# Patient Record
Sex: Male | Born: 1955 | Race: Black or African American | Hispanic: No | Marital: Single | Smoking: Never smoker
Health system: Southern US, Community
[De-identification: ages and names within clinical notes are randomized; demographics above are authoritative.]

## PROBLEM LIST (undated history)

## (undated) DIAGNOSIS — E119 Type 2 diabetes mellitus without complications: Secondary | ICD-10-CM

## (undated) DIAGNOSIS — I10 Essential (primary) hypertension: Secondary | ICD-10-CM

## (undated) DIAGNOSIS — F1010 Alcohol abuse, uncomplicated: Secondary | ICD-10-CM

---

## 2014-08-21 ENCOUNTER — Emergency Department (INDEPENDENT_AMBULATORY_CARE_PROVIDER_SITE_OTHER)
Admission: EM | Admit: 2014-08-21 | Discharge: 2014-08-21 | Disposition: A | Discharge status: Home / Self Care | Payer: Medicaid Other | Source: Home / Self Care | Attending: Emergency Medicine | Admitting: Emergency Medicine

## 2014-08-21 ENCOUNTER — Encounter (HOSPITAL_COMMUNITY): Payer: Self-pay | Admitting: *Deleted

## 2014-08-21 DIAGNOSIS — M7989 Other specified soft tissue disorders: Secondary | ICD-10-CM | POA: Diagnosis not present

## 2014-08-21 LAB — POCT I-STAT, CHEM 8
BUN: 13 mg/dL (ref 6–20)
CALCIUM ION: 1.19 mmol/L (ref 1.13–1.30)
CHLORIDE: 105 mmol/L (ref 101–111)
Creatinine, Ser: 1.3 mg/dL — ABNORMAL HIGH (ref 0.61–1.24)
GLUCOSE: 104 mg/dL — AB (ref 65–99)
HEMATOCRIT: 45 % (ref 39.0–52.0)
Hemoglobin: 15.3 g/dL (ref 13.0–17.0)
Potassium: 4.3 mmol/L (ref 3.5–5.1)
SODIUM: 140 mmol/L (ref 135–145)
TCO2: 25 mmol/L (ref 0–100)

## 2014-08-21 MED ORDER — PREDNISONE 50 MG PO TABS: ORAL_TABLET | ORAL | Status: DC

## 2014-08-21 MED ORDER — IBUPROFEN 800 MG PO TABS: 800.0000 mg | ORAL_TABLET | Freq: Three times a day (TID) | ORAL | Status: DC

## 2014-08-21 NOTE — Discharge Instructions (Signed)
Take prednisone 1 pill daily for 5 days. Take ibuprofen 3 times a day for the next 5 days, then as needed. Come back if no improvement in 2 weeks.

## 2014-08-21 NOTE — ED Notes (Signed)
Pt  Speaks  No english    He  Has  Been in  [place]   approx  5  Months    He  Takes no meds      He  Has  No chronic  Medical  Conditions     [employer] interpretors  Utilized        he  Has   Swelling  Of the  Left  Foot and  Leg       With  Perhaps  Some  Swelling to r  Foot  As   Well   He  denys  Any  Injury      He  denys  Any  Chest pain or  Any  Shortness  Of  Breath  He  Is   Sitting  Upright  And  Is  Speaking in  Complete  sentances

## 2014-08-21 NOTE — ED Provider Notes (Signed)
CSN: [id number]     Arrival date & time 08/21/14  1721 History   First MD Initiated Contact with Patient 08/21/14 1745     Chief Complaint  Patient presents with  . Foot Swelling   (Consider location/radiation/quality/duration/timing/severity/associated sxs/prior Treatment) HPI  He is a 59 year old man here for evaluation of foot swelling. A phone interpreter was utilized for this encounter. He states he woke up on Wednesday morning and his left foot was swollen. He states the right foot is also a little swollen, but the left is worse. He reports some pain and discomfort, primarily along the lateral leg and dorsal foot. He denies any injury or trauma. He does do a lot of walking, but this has not changed recently. No fevers. No chest pain or shortness of breath. He came to [place] in January of this year. He has from the [place] and came [place] the [place] through [place].  History reviewed. No pertinent past medical history. History reviewed. No pertinent past surgical history. History reviewed. No pertinent family history. History  Substance Use Topics  . Smoking status: Never Smoker   . Smokeless tobacco: Not on file  . Alcohol Use: No    Review of Systems As in history of present illness Allergies  Review of patient's allergies indicates no known allergies.  Home Medications   Prior to Admission medications   Medication Sig Start Date End Date Taking? Authorizing Provider  ibuprofen (ADVIL,MOTRIN) 800 MG tablet Take 1 tablet (800 mg total) by mouth 3 (three) times daily. For 5 days, then as needed 08/21/14   Charm RingsErin J Honig, MD  predniSONE (DELTASONE) 50 MG tablet Take 1 pill daily for 5 days. 08/21/14   Charm RingsErin J Honig, MD   BP 152/93 mmHg  Pulse 72  Resp 14  SpO2 97% Physical Exam  Constitutional: He is oriented to person, place, and time. He appears well-developed and well-nourished.  Neck: Neck supple.  Cardiovascular: Normal rate, regular rhythm and normal heart sounds.   No murmur  heard. Pulmonary/Chest: Effort normal and breath sounds normal. No respiratory distress. He has no wheezes. He has no rales.  Musculoskeletal:  Left foot: He has nonpitting edema of the foot and lower leg. No calf tenderness. He has mild tenderness to palpation along the fibula and across the dorsal foot. He has a 2+ DP pulse. No pain with passive range of motion of the ankle or toes.  Neurological: He is alert and oriented to person, place, and time.    ED Course  Procedures (including critical care time) Labs Review Labs Reviewed  POCT I-STAT, CHEM 8 - Abnormal; Notable for the following:    Creatinine, Ser 1.30 (*)    Glucose, Bld 104 (*)    All other components within normal limits    Imaging Review No results found.   MDM   1. Foot swelling    Unclear etiology. History is not concerning for DVT or heart failure. Differential does include tendinitis and gout. We'll treat with a course of prednisone and ibuprofen. [facility]. He will follow-up with them in 2 weeks or sooner as needed.    Charm RingsErin J Honig, MD 08/21/14 [phone]

## 2014-08-25 ENCOUNTER — Inpatient Hospital Stay (HOSPITAL_COMMUNITY)
Admission: EM | Admit: 2014-08-25 | Discharge: 2014-08-27 | DRG: 642 | Disposition: A | Discharge status: Home Health | Payer: Medicaid Other | Attending: Internal Medicine | Admitting: Internal Medicine

## 2014-08-25 ENCOUNTER — Encounter (HOSPITAL_COMMUNITY): Payer: Self-pay | Admitting: Emergency Medicine

## 2014-08-25 ENCOUNTER — Emergency Department (HOSPITAL_COMMUNITY): Payer: Medicaid Other

## 2014-08-25 ENCOUNTER — Inpatient Hospital Stay (HOSPITAL_COMMUNITY): Payer: Medicaid Other

## 2014-08-25 DIAGNOSIS — I5030 Unspecified diastolic (congestive) heart failure: Secondary | ICD-10-CM | POA: Diagnosis present

## 2014-08-25 DIAGNOSIS — R200 Anesthesia of skin: Secondary | ICD-10-CM | POA: Diagnosis present

## 2014-08-25 DIAGNOSIS — E882 Lipomatosis, not elsewhere classified: Secondary | ICD-10-CM | POA: Diagnosis present

## 2014-08-25 DIAGNOSIS — M545 Low back pain: Secondary | ICD-10-CM | POA: Diagnosis present

## 2014-08-25 DIAGNOSIS — M4856XD Collapsed vertebra, not elsewhere classified, lumbar region, subsequent encounter for fracture with routine healing: Secondary | ICD-10-CM | POA: Diagnosis present

## 2014-08-25 DIAGNOSIS — E1140 Type 2 diabetes mellitus with diabetic neuropathy, unspecified: Secondary | ICD-10-CM | POA: Diagnosis present

## 2014-08-25 DIAGNOSIS — E1165 Type 2 diabetes mellitus with hyperglycemia: Secondary | ICD-10-CM | POA: Diagnosis present

## 2014-08-25 DIAGNOSIS — F1010 Alcohol abuse, uncomplicated: Secondary | ICD-10-CM

## 2014-08-25 DIAGNOSIS — R600 Localized edema: Secondary | ICD-10-CM | POA: Diagnosis not present

## 2014-08-25 DIAGNOSIS — M79606 Pain in leg, unspecified: Secondary | ICD-10-CM | POA: Diagnosis present

## 2014-08-25 DIAGNOSIS — M7989 Other specified soft tissue disorders: Secondary | ICD-10-CM | POA: Diagnosis present

## 2014-08-25 DIAGNOSIS — F1011 Alcohol abuse, in remission: Secondary | ICD-10-CM | POA: Diagnosis present

## 2014-08-25 DIAGNOSIS — I5021 Acute systolic (congestive) heart failure: Secondary | ICD-10-CM | POA: Diagnosis not present

## 2014-08-25 DIAGNOSIS — M25561 Pain in right knee: Secondary | ICD-10-CM | POA: Diagnosis present

## 2014-08-25 DIAGNOSIS — M79604 Pain in right leg: Secondary | ICD-10-CM | POA: Diagnosis not present

## 2014-08-25 DIAGNOSIS — M25562 Pain in left knee: Secondary | ICD-10-CM

## 2014-08-25 DIAGNOSIS — M79605 Pain in left leg: Secondary | ICD-10-CM

## 2014-08-25 DIAGNOSIS — R739 Hyperglycemia, unspecified: Secondary | ICD-10-CM | POA: Diagnosis not present

## 2014-08-25 DIAGNOSIS — E119 Type 2 diabetes mellitus without complications: Secondary | ICD-10-CM | POA: Diagnosis present

## 2014-08-25 DIAGNOSIS — R531 Weakness: Secondary | ICD-10-CM

## 2014-08-25 DIAGNOSIS — M549 Dorsalgia, unspecified: Secondary | ICD-10-CM | POA: Diagnosis present

## 2014-08-25 DIAGNOSIS — M5440 Lumbago with sciatica, unspecified side: Secondary | ICD-10-CM | POA: Diagnosis not present

## 2014-08-25 HISTORY — DX: Alcohol abuse, uncomplicated: F10.10

## 2014-08-25 LAB — CBC WITH DIFFERENTIAL/PLATELET
BASOS ABS: 0 10*3/uL (ref 0.0–0.1)
Basophils Relative: 0 % (ref 0–1)
EOS ABS: 0 10*3/uL (ref 0.0–0.7)
Eosinophils Relative: 0 % (ref 0–5)
HEMATOCRIT: 41.4 % (ref 39.0–52.0)
HEMOGLOBIN: 13.7 g/dL (ref 13.0–17.0)
Lymphocytes Relative: 16 % (ref 12–46)
Lymphs Abs: 1.3 10*3/uL (ref 0.7–4.0)
MCH: 27.5 pg (ref 26.0–34.0)
MCHC: 33.1 g/dL (ref 30.0–36.0)
MCV: 83 fL (ref 78.0–100.0)
Monocytes Absolute: 0.1 10*3/uL (ref 0.1–1.0)
Monocytes Relative: 1 % — ABNORMAL LOW (ref 3–12)
NEUTROS PCT: 83 % — AB (ref 43–77)
Neutro Abs: 6.7 10*3/uL (ref 1.7–7.7)
Platelets: 193 10*3/uL (ref 150–400)
RBC: 4.99 MIL/uL (ref 4.22–5.81)
RDW: 13.8 % (ref 11.5–15.5)
WBC: 8.1 10*3/uL (ref 4.0–10.5)

## 2014-08-25 LAB — URINALYSIS, ROUTINE W REFLEX MICROSCOPIC
Bilirubin Urine: NEGATIVE
GLUCOSE, UA: NEGATIVE mg/dL
HGB URINE DIPSTICK: NEGATIVE
Ketones, ur: NEGATIVE mg/dL
Leukocytes, UA: NEGATIVE
NITRITE: NEGATIVE
PH: 6.5 (ref 5.0–8.0)
Protein, ur: NEGATIVE mg/dL
Specific Gravity, Urine: 1.009 (ref 1.005–1.030)
Urobilinogen, UA: 1 mg/dL (ref 0.0–1.0)

## 2014-08-25 LAB — COMPREHENSIVE METABOLIC PANEL
ALK PHOS: 60 U/L (ref 38–126)
ALT: 33 U/L (ref 17–63)
AST: 19 U/L (ref 15–41)
Albumin: 4 g/dL (ref 3.5–5.0)
Anion gap: 10 (ref 5–15)
BUN: 17 mg/dL (ref 6–20)
CO2: 24 mmol/L (ref 22–32)
CREATININE: 0.92 mg/dL (ref 0.61–1.24)
Calcium: 9 mg/dL (ref 8.9–10.3)
Chloride: 105 mmol/L (ref 101–111)
GFR calc Af Amer: >60 mL/min (ref >60)
GFR calc non Af Amer: >60 mL/min (ref >60)
GLUCOSE: 221 mg/dL — AB (ref 65–99)
Potassium: 4.4 mmol/L (ref 3.5–5.1)
SODIUM: 139 mmol/L (ref 135–145)
Total Bilirubin: 0.4 mg/dL (ref 0.3–1.2)
Total Protein: 7.7 g/dL (ref 6.5–8.1)

## 2014-08-25 LAB — CK: Total CK: 62 U/L (ref 49–397)

## 2014-08-25 LAB — SEDIMENTATION RATE: Sed Rate: 2 mm/hr (ref 0–16)

## 2014-08-25 MED ORDER — MORPHINE SULFATE 4 MG/ML IJ SOLN
4.0000 mg | Freq: Once | INTRAMUSCULAR | Status: AC
  Administered 2014-08-25: 4 mg via INTRAVENOUS
  Filled 2014-08-25: qty 1

## 2014-08-25 MED ORDER — SODIUM CHLORIDE 0.9 % IV SOLN
INTRAVENOUS | Status: DC
  Administered 2014-08-25: 125 mL/h via INTRAVENOUS

## 2014-08-25 NOTE — Progress Notes (Signed)
Patient noted to have [place] [employer] [place] [employer] [place] [facility]

## 2014-08-25 NOTE — ED Notes (Signed)
Per EMS, Pt was seen at urgent care a couple days ago for foot pain and dx with foot swelling. Pt has had extreme pain in both soles of his feet. Pt sts the pain has gotten worse. Pt has been ambulating by crawling on his knees. Pt has only been in the [place].  No obvious deformity noted.

## 2014-08-25 NOTE — ED Notes (Signed)
Pt c/o bilateral lower leg pain and lower back pain that radiates down both legs. Pt denies injury. Pain started last week. Pt has no hx of this pain. Pt denies PMH. Pt has full ROM in legs with pain. A&Ox4. No redness or warmth noted to either calf.

## 2014-08-25 NOTE — ED Notes (Signed)
Pt wants us to call relative, Fiston at [phone] when he is ready to be d/c. Pt's family speaks [place] and [place].

## 2014-08-25 NOTE — ED Provider Notes (Signed)
CSN: [id number]     Arrival date & time 08/25/14  1752 History   First MD Initiated Contact with Patient 08/25/14 1817     Chief Complaint  Patient presents with  . Leg Pain  . Back Pain     (Consider location/radiation/quality/duration/timing/severity/associated sxs/prior Treatment) HPI Comments: Patient here complaining of 10 days of worsening bilateral lower extremity pain. Pain starts in his thoracic lumbar spine and radiates down to his lower extremities. Denies any bowel or bladder dysfunction. No saddle anesthesias. Was seen in the clinic recently and started on antiarrhythmics for his. States that today he was anemic and related due to the severe paresthesias to both of his feet worse on the left side. Denies any trauma. No prior history of same. Denies abdominal pain. No syncope  Patient is a 59 y.o. male presenting with leg pain and back pain. The history is provided by the patient. A language interpreter was used.  Leg Pain Associated symptoms: back pain   Back Pain Associated symptoms: leg pain     History reviewed. No pertinent past medical history. History reviewed. No pertinent past surgical history. No family history on file. History  Substance Use Topics  . Smoking status: Never Smoker   . Smokeless tobacco: Not on file  . Alcohol Use: No    Review of Systems  Musculoskeletal: Positive for back pain.  All other systems reviewed and are negative.     Allergies  Review of patient's allergies indicates not on file.  Home Medications   Prior to Admission medications   Not on File   BP 157/96 mmHg  Pulse 72  Temp(Src) 98.3 F (36.8 C) (Oral)  Resp 18  SpO2 98% Physical Exam  Constitutional: He is oriented to person, place, and time. He appears well-developed and well-nourished.  Non-toxic appearance. No distress.  HENT:  Head: Normocephalic and atraumatic.  Eyes: Conjunctivae, EOM and lids are normal. Pupils are equal, round, and reactive to light.    Neck: Normal range of motion. Neck supple. No tracheal deviation present. No thyroid mass present.  Cardiovascular: Normal rate, regular rhythm and normal heart sounds.  Exam reveals no gallop.   No murmur heard. Pulmonary/Chest: Effort normal and breath sounds normal. No stridor. No respiratory distress. He has no decreased breath sounds. He has no wheezes. He has no rhonchi. He has no rales.  Abdominal: Soft. Normal appearance and bowel sounds are normal. He exhibits no distension. There is no tenderness. There is no rebound and no CVA tenderness.  Musculoskeletal: Normal range of motion. He exhibits no edema or tenderness.       Back:  Neurological: He is alert and oriented to person, place, and time. He has normal strength. He displays no atrophy and no tremor. No cranial nerve deficit or sensory deficit. GCS eye subscore is 4. GCS verbal subscore is 5. GCS motor subscore is 6.  Reflex Scores:      Patellar reflexes are 3+ on the right side and 3+ on the left side. Skin: Skin is warm and dry. No abrasion and no rash noted.  Psychiatric: He has a normal mood and affect. His speech is normal and behavior is normal.  Nursing note and vitals reviewed.   ED Course  Procedures (including critical care time) Labs Review Labs Reviewed  CBC WITH DIFFERENTIAL/PLATELET  COMPREHENSIVE METABOLIC PANEL  SEDIMENTATION RATE  CK    Imaging Review No results found.   EKG Interpretation None      MDM  Final diagnoses:  Weakness  Weakness    Patient MRI of his lumbar and thoracic spine both of which were negative for cord compression. Spoke with neurology on call and recommend patient be admitted and they were round and the patient the morning. They agree with not doing any intracranial or cervical spine imaging at this time.    Lorre Nick, MD 08/25/14 [phone]

## 2014-08-25 NOTE — ED Notes (Signed)
Pt returned from MRI °

## 2014-08-25 NOTE — ED Notes (Signed)
Patient is in MRI.  

## 2014-08-25 NOTE — ED Notes (Signed)
Hospitalist at bedside 

## 2014-08-25 NOTE — H&P (Addendum)
[facility] History and Physical  Randy Fisher SVX:[id number] DOB: 03/19/1956 DOA: 08/25/2014  Referring physician: ED physician PCP: PROVIDER NOT IN SYSTEM  Specialists:   Chief Complaint: Back pain, bilateral leg pain, leg edema, weakness, tingling sensations.  HPI: Randy Fisher is a 59 y.o. male with PMH of alcohol abuse, who presents with back pain, bilateral leg pain, bilateral leg edema, weakness tingling sensations.  Patient is a refugee, came to [place] from [place] on 03/2014. He does not speak [place], history is obtained through Optometrist. Patient reports that in the past 10 days, he has been having lower back pain, which is radiating down to his legs and feet bilaterally. It is associated with leg weakness and tingling sensations. He also has bilateral leg edema, worse on the left side. He does not have fever, chills, chest pain, shortness breath, cough, diarrhea, symptoms of her UTI. No tenderness over the calf area.  He was seen in urgent care 2 days ago and was given prescription of prednisone and ibuprofen. He has been taking those 2 medications, but withoy any improvement. He reports that he could possibly have an insect bite to the left foot at home 2 weeks ago, but not very sure.  In ED, patient was found to have WBC 8.1, temperature normal, no tachycardia, negative urinalysis, CK 62, ESR 2, electrolytes okay. MRI of the thoracic and lumbar spine showed epidural lipomatosis at L4-5 and L5-S1 narrowing the central thecal sac, mild left foraminal narrowing at T1-2, mild left foraminal narrowing at L4-5 secondary to a leftward, remote compression fractures of L5 and T4 and moderate distention of the urinary bladder of uncertain etiology. Patient is admitted to inpatient for further evaluation and treatment. Neurology was consulted by ED.  Where does patient live?   At home Can patient participate in ADLs?  Yes      Review of Systems:   General: no fevers, chills, no  changes in body weight, has fatigue HEENT: no blurry vision, hearing changes or sore throat Pulm: no dyspnea, coughing, wheezing CV: no chest pain, palpitations Abd: no nausea, vomiting, abdominal pain, diarrhea, constipation GU: no dysuria, burning on urination, increased urinary frequency, hematuria  Ext: has leg edema and leg pain Neuro: has leg weakness, numbness, or tingling, no vision change or hearing loss Skin: no rash MSK: No muscle spasm, no deformity, no limitation of range of movement in spin Heme: No easy bruising.  Travel history: No recent long distant travel.  Allergy: Not on File  Past Medical History  Diagnosis Date  . Alcohol abuse     History reviewed. No pertinent past surgical history.  Social History:  reports that he has never smoked. He does not have any smokeless tobacco history on file. He reports that he drinks alcohol. His drug history is not on file.  Family History: Patient reports that all family members are healthy, no specific medical issues.  Prior to Admission medications   Not on File    Physical Exam: Filed Vitals:   08/26/14 0119 08/26/14 0254 08/26/14 0536 08/26/14 0706  BP: 159/84 147/82 125/72   Pulse: 78 81 74   Temp: 97.6 F (36.4 C) 97.8 F (36.6 C) 98.2 F (36.8 C)   TempSrc: Oral Oral Oral   Resp: _0 Height:    _1  (1.753 m)  Weight:   81.2 kg (179 lb 0.2 oz)   SpO2: 100% 99% 100%    General: Not in acute distress HEENT:  Eyes: PERRL, EOMI, no scleral icterus.       ENT: No discharge from the ears and nose, no pharynx injection, no tonsillar enlargement.        Neck: positive JVD upon compression of liver, no bruit, no mass felt. Heme: No neck lymph node enlargement. Cardiac: S1/S2, RRR, No murmurs, No gallops or rubs. Pulm: Good air movement bilaterally. No rales, wheezing, rhonchi or rubs. Abd: Soft, nondistended, nontender, no rebound pain, no organomegaly, BS present. Ext: 2+ pitting leg edema on  the left and 1+ on the right. 2+DP/PT pulse bilaterally. Musculoskeletal: No joint deformities, No joint redness or warmth, no limitation of ROM in spin. Skin: No rashes.  Neuro: Alert, oriented X3, cranial nerves II-XII grossly intact, muscle strength 4/5 in all extremities, sensation to light touch intact. Brachial reflex 2+ bilaterally. Knee reflex 2+ on the right, but absence on the left leg. Negative Babinski's sign. Normal finger to nose test. Psych: Patient is not psychotic, no suicidal or hemocidal ideation.  Labs on Admission:  Basic Metabolic Panel:  Recent Labs Lab 08/25/14 1856 08/26/14 0545  NA 139 138  K 4.4 3.8  CL 105 108  CO2 24 26  GLUCOSE 221* 174*  BUN 17 17  CREATININE 0.92 0.87  CALCIUM 9.0 8.5*   Liver Function Tests:  Recent Labs Lab 08/25/14 1856  AST 19  ALT 33  ALKPHOS 60  BILITOT 0.4  PROT 7.7  ALBUMIN 4.0   No results for input(s): LIPASE, AMYLASE in the last 168 hours. No results for input(s): AMMONIA in the last 168 hours. CBC:  Recent Labs Lab 08/25/14 1856 08/26/14 0545  WBC 8.1 8.8  NEUTROABS 6.7  --   HGB 13.7 12.5*  HCT 41.4 38.1*  MCV 83.0 82.1  PLT 193 194   Cardiac Enzymes:  Recent Labs Lab 08/25/14 1856  CKTOTAL 62    BNP (last 3 results) No results for input(s): BNP in the last 8760 hours.  ProBNP (last 3 results) No results for input(s): PROBNP in the last 8760 hours.  CBG:  Recent Labs Lab 08/26/14 0727  GLUCAP 132*    Radiological Exams on Admission: Dg Chest 2 View  08/26/2014   CLINICAL DATA:  Extremity swelling.  EXAM: CHEST  2 VIEW  COMPARISON:  None.  FINDINGS: Low lung volumes. Cardiomegaly. Mild vascular congestion. No effusion or pneumothorax. Bones unremarkable. Chronic compression deformities as reported previously on MR. Both lungs are clear. The visualized skeletal structures are unremarkable.  IMPRESSION: Cardiomegaly. Mild vascular congestion. Early CHF not excluded. Correlate clinically  as a cause of extremity swelling.   Electronically Signed   By: Rolla Flatten M.D.   On: 08/26/2014 01:26   Mr Thoracic Spine Wo Contrast  08/25/2014   CLINICAL DATA:  Unable to ambulate. Bilateral lower leg pain. Low back pain extending into both lower extremities. The pain began last week.  EXAM: MRI THORACIC AND LUMBAR SPINE WITHOUT CONTRAST  TECHNIQUE: Multiplanar and multiecho pulse sequences of the thoracic and lumbar spine were obtained without intravenous contrast.  COMPARISON:  None available.  FINDINGS: MR THORACIC SPINE FINDINGS  The study is mildly degraded by patient motion. Normal signal is present within the thoracic spinal cord. Marrow signal, marrow signal is normal. A remote superior endplate fracture is present at T4. Vertebral body heights are otherwise normal. Alignment is anatomic. No significant disc protrusion or stenosis is present. Asymmetric left-sided facet hypertrophy narrows the foramen at T1-2. The foramina are otherwise patent bilaterally. The paraspinous  soft tissues are normal.  MR LUMBAR SPINE FINDINGS  The conus medullaris scratch the normal signal is present in the conus medullaris which terminates at L1-2. A remote superior endplate fracture is present at L5. No acute fractures are present. Marrow signal and vertebral body heights are otherwise normal. Limited imaging of the abdomen is unremarkable.  L1-2: Mild facet hypertrophy is present bilaterally without significant stenosis.  L2-3: A leftward disc bulge is present. Mild facet hypertrophy is noted bilaterally. There is no significant stenosis.  L3-4: Mild disc bulging and facet hypertrophy is present without significant stenosis.  L4-5: A left lateral disc protrusion and annular tear is noted. Moderate facet hypertrophy is present bilaterally. This leads to mild left foraminal stenosis. The central canal is patent. Prominent epidural fat is noted.  L5-S1: Mild facet hypertrophy is present. Prominent epidural fat is noted.   The urinary bladder is distended, extending superiorly to the level of L4-5.  IMPRESSION: MR THORACIC SPINE IMPRESSION  1. Mild left foraminal narrowing at T1-2 secondary to facet hypertrophy. 2. No significant disc protrusion or or significant stenosis in the remainder of the thoracic spine. 3. Mild left foraminal narrowing at L4-5 secondary to a leftward disc protrusion and annular tear. 4. Multilevel facet hypertrophy in the lumbar spine. 5. 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 6. 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 7. Moderate distention of the urinary bladder of uncertain etiology. 8. Remote compression fractures of L5 and T4. No acute fractures are present.  MR LUMBAR SPINE IMPRESSION   Electronically Signed   By: San Morelle M.D.   On: 08/25/2014 21:01   Mr Lumbar Spine Wo Contrast  08/25/2014   CLINICAL DATA:  Unable to ambulate. Bilateral lower leg pain. Low back pain extending into both lower extremities. The pain began last week.  EXAM: MRI THORACIC AND LUMBAR SPINE WITHOUT CONTRAST  TECHNIQUE: Multiplanar and multiecho pulse sequences of the thoracic and lumbar spine were obtained without intravenous contrast.  COMPARISON:  None available.  FINDINGS: MR THORACIC SPINE FINDINGS  The study is mildly degraded by patient motion. Normal signal is present within the thoracic spinal cord. Marrow signal, marrow signal is normal. A remote superior endplate fracture is present at T4. Vertebral body heights are otherwise normal. Alignment is anatomic. No significant disc protrusion or stenosis is present. Asymmetric left-sided facet hypertrophy narrows the foramen at T1-2. The foramina are otherwise patent bilaterally. The paraspinous soft tissues are normal.  MR LUMBAR SPINE FINDINGS  The conus medullaris scratch the normal signal is present in the conus medullaris which terminates at  L1-2. A remote superior endplate fracture is present at L5. No acute fractures are present. Marrow signal and vertebral body heights are otherwise normal. Limited imaging of the abdomen is unremarkable.  L1-2: Mild facet hypertrophy is present bilaterally without significant stenosis.  L2-3: A leftward disc bulge is present. Mild facet hypertrophy is noted bilaterally. There is no significant stenosis.  L3-4: Mild disc bulging and facet hypertrophy is present without significant stenosis.  L4-5: A left lateral disc protrusion and annular tear is noted. Moderate facet hypertrophy is present bilaterally. This leads to mild left foraminal stenosis. The central canal is patent. Prominent epidural fat is noted.  L5-S1: Mild facet hypertrophy is present. Prominent epidural fat is noted.  The urinary bladder is distended, extending superiorly to the level of L4-5.  IMPRESSION: MR THORACIC SPINE IMPRESSION  1. Mild left foraminal narrowing at T1-2 secondary to facet hypertrophy. 2. No significant disc protrusion or or significant stenosis in the remainder of the thoracic spine. 3. Mild left foraminal narrowing at L4-5 secondary to a leftward disc protrusion and annular tear. 4. Multilevel facet hypertrophy in the lumbar spine. 5. 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 6. 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 7. Moderate distention of the urinary bladder of uncertain etiology. 8. Remote compression fractures of L5 and T4. No acute fractures are present.  MR LUMBAR SPINE IMPRESSION   Electronically Signed   By: San Morelle M.D.   On: 08/25/2014 21:01    EKG: Not done in ED, will get one.   Assessment/Plan Principal Problem:   Leg pain, bilateral Active Problems:   Weakness   Back pain   Leg swelling   Hyperglycemia   History of alcohol abuse   Leg pain   Leg edema  Back pain, leg pain and  leg weakness: it is likely due to spinal disc disease and epidural lipomatosis as evidenced by MRI of thoracic and lumbar spine. Neurology was consulted by ED. -will admit to tele bed -Pain control: Percocet and morphine when necessary -Follow-up there are this recommendation -UDS and HIV -check CRP  Asymmetric leg edema: Left is worse than the right. Etiology is not clear. Patient has positive JVD and history of alcohol abuse, he may have alcoholic cardiomyopathy or congestive heart failure. Also need to rule out DVT. -Check BNP -2-D echo -EKG -LE doppler -CXR  Hyperglycemia: Blood sugar 221 on BMP on admission. May be due to recent prednisone use or undiagnosed diabetes -check A1c -Sliding-scale insulin  History of alcohol abuse: Patient reports that he was drinking heavily before coming to [place]. He drinks beers almost every day. He has not been drinking since 03/2014. Currently no signs of withdrawal. -observe withdrawal symptoms closely.   DVT ppx: SQ Heparin     Code Status: Full code Family Communication: None at bed side.    Disposition Plan: Admit to inpatient   Date of Service 08/26/2014    Ivor Costa [facility] Pager [phone]  If 7PM-7AM, please contact night-coverage [email or web address] Password [facility] 08/26/2014, 7:44 AM

## 2014-08-26 ENCOUNTER — Inpatient Hospital Stay (HOSPITAL_COMMUNITY): Payer: Medicaid Other

## 2014-08-26 ENCOUNTER — Encounter (HOSPITAL_COMMUNITY): Payer: Self-pay | Admitting: Internal Medicine

## 2014-08-26 DIAGNOSIS — R600 Localized edema: Secondary | ICD-10-CM | POA: Insufficient documentation

## 2014-08-26 DIAGNOSIS — M25561 Pain in right knee: Secondary | ICD-10-CM | POA: Diagnosis present

## 2014-08-26 DIAGNOSIS — M7989 Other specified soft tissue disorders: Secondary | ICD-10-CM | POA: Diagnosis present

## 2014-08-26 DIAGNOSIS — I5021 Acute systolic (congestive) heart failure: Secondary | ICD-10-CM

## 2014-08-26 DIAGNOSIS — M549 Dorsalgia, unspecified: Secondary | ICD-10-CM | POA: Diagnosis present

## 2014-08-26 DIAGNOSIS — M25562 Pain in left knee: Secondary | ICD-10-CM

## 2014-08-26 DIAGNOSIS — M79605 Pain in left leg: Secondary | ICD-10-CM

## 2014-08-26 DIAGNOSIS — M5440 Lumbago with sciatica, unspecified side: Secondary | ICD-10-CM

## 2014-08-26 DIAGNOSIS — E119 Type 2 diabetes mellitus without complications: Secondary | ICD-10-CM | POA: Diagnosis present

## 2014-08-26 DIAGNOSIS — F1011 Alcohol abuse, in remission: Secondary | ICD-10-CM | POA: Diagnosis present

## 2014-08-26 DIAGNOSIS — M79604 Pain in right leg: Secondary | ICD-10-CM

## 2014-08-26 LAB — CBC
HEMATOCRIT: 38.1 % — AB (ref 39.0–52.0)
Hemoglobin: 12.5 g/dL — ABNORMAL LOW (ref 13.0–17.0)
MCH: 26.9 pg (ref 26.0–34.0)
MCHC: 32.8 g/dL (ref 30.0–36.0)
MCV: 82.1 fL (ref 78.0–100.0)
Platelets: 194 10*3/uL (ref 150–400)
RBC: 4.64 MIL/uL (ref 4.22–5.81)
RDW: 14 % (ref 11.5–15.5)
WBC: 8.8 10*3/uL (ref 4.0–10.5)

## 2014-08-26 LAB — PROTIME-INR
INR: 1.13 (ref 0.00–1.49)
PROTHROMBIN TIME: 14.7 s (ref 11.6–15.2)

## 2014-08-26 LAB — GLUCOSE, CAPILLARY
GLUCOSE-CAPILLARY: 146 mg/dL — AB (ref 65–99)
GLUCOSE-CAPILLARY: 134 mg/dL — AB (ref 65–99)
Glucose-Capillary: 132 mg/dL — ABNORMAL HIGH (ref 65–99)
Glucose-Capillary: 248 mg/dL — ABNORMAL HIGH (ref 65–99)

## 2014-08-26 LAB — BASIC METABOLIC PANEL
Anion gap: 4 — ABNORMAL LOW (ref 5–15)
BUN: 17 mg/dL (ref 6–20)
CHLORIDE: 108 mmol/L (ref 101–111)
CO2: 26 mmol/L (ref 22–32)
Calcium: 8.5 mg/dL — ABNORMAL LOW (ref 8.9–10.3)
Creatinine, Ser: 0.87 mg/dL (ref 0.61–1.24)
GFR calc Af Amer: >60 mL/min (ref >60)
GFR calc non Af Amer: >60 mL/min (ref >60)
Glucose, Bld: 174 mg/dL — ABNORMAL HIGH (ref 65–99)
POTASSIUM: 3.8 mmol/L (ref 3.5–5.1)
Sodium: 138 mmol/L (ref 135–145)

## 2014-08-26 LAB — C-REACTIVE PROTEIN: CRP: 0.7 mg/dL (ref <1.0)

## 2014-08-26 LAB — APTT: aPTT: 28 seconds (ref 24–37)

## 2014-08-26 LAB — RAPID URINE DRUG SCREEN, HOSP PERFORMED
Amphetamines: NOT DETECTED
BENZODIAZEPINES: NOT DETECTED
Barbiturates: NOT DETECTED
COCAINE: NOT DETECTED
Opiates: POSITIVE — AB
Tetrahydrocannabinol: NOT DETECTED

## 2014-08-26 LAB — URINE CULTURE: Colony Count: 3000

## 2014-08-26 LAB — BRAIN NATRIURETIC PEPTIDE: B Natriuretic Peptide: 100.5 pg/mL — ABNORMAL HIGH (ref 0.0–100.0)

## 2014-08-26 LAB — TSH: TSH: 1.6 u[IU]/mL (ref 0.350–4.500)

## 2014-08-26 LAB — VITAMIN B12: VITAMIN B 12: 392 pg/mL (ref 180–914)

## 2014-08-26 MED ORDER — HEPARIN SODIUM (PORCINE) 5000 UNIT/ML IJ SOLN
5000.0000 [IU] | Freq: Three times a day (TID) | INTRAMUSCULAR | Status: DC
  Administered 2014-08-26 (×3): 5000 [IU] via SUBCUTANEOUS
  Filled 2014-08-26 (×7): qty 1

## 2014-08-26 MED ORDER — ACETAMINOPHEN 650 MG RE SUPP: 650.0000 mg | Freq: Four times a day (QID) | RECTAL | Status: DC | PRN

## 2014-08-26 MED ORDER — OXYCODONE-ACETAMINOPHEN 5-325 MG PO TABS
1.0000 | ORAL_TABLET | ORAL | Status: DC | PRN
  Administered 2014-08-26 – 2014-08-27 (×3): 1 via ORAL
  Filled 2014-08-26 (×3): qty 1

## 2014-08-26 MED ORDER — MORPHINE SULFATE 2 MG/ML IJ SOLN: 2.0000 mg | INTRAMUSCULAR | Status: DC | PRN

## 2014-08-26 MED ORDER — ONDANSETRON HCL 4 MG PO TABS: 4.0000 mg | ORAL_TABLET | Freq: Four times a day (QID) | ORAL | Status: DC | PRN

## 2014-08-26 MED ORDER — POLYETHYLENE GLYCOL 3350 17 G PO PACK
17.0000 g | PACK | Freq: Two times a day (BID) | ORAL | Status: DC
  Administered 2014-08-26 – 2014-08-27 (×3): 17 g via ORAL
  Filled 2014-08-26 (×4): qty 1

## 2014-08-26 MED ORDER — ALUM & MAG HYDROXIDE-SIMETH 200-200-20 MG/5ML PO SUSP: 30.0000 mL | Freq: Four times a day (QID) | ORAL | Status: DC | PRN

## 2014-08-26 MED ORDER — GABAPENTIN 100 MG PO CAPS
100.0000 mg | ORAL_CAPSULE | Freq: Three times a day (TID) | ORAL | Status: DC
  Administered 2014-08-26 – 2014-08-27 (×3): 100 mg via ORAL
  Filled 2014-08-26 (×5): qty 1

## 2014-08-26 MED ORDER — SODIUM CHLORIDE 0.9 % IJ SOLN
3.0000 mL | Freq: Two times a day (BID) | INTRAMUSCULAR | Status: DC
  Administered 2014-08-26 – 2014-08-27 (×3): 3 mL via INTRAVENOUS

## 2014-08-26 MED ORDER — ACETAMINOPHEN 325 MG PO TABS: 650.0000 mg | ORAL_TABLET | Freq: Four times a day (QID) | ORAL | Status: DC | PRN

## 2014-08-26 MED ORDER — INSULIN ASPART 100 UNIT/ML ~~LOC~~ SOLN
0.0000 [IU] | Freq: Three times a day (TID) | SUBCUTANEOUS | Status: DC
  Administered 2014-08-26 (×3): 1 [IU] via SUBCUTANEOUS

## 2014-08-26 MED ORDER — FUROSEMIDE 10 MG/ML IJ SOLN
20.0000 mg | Freq: Every day | INTRAMUSCULAR | Status: DC
  Administered 2014-08-26 – 2014-08-27 (×2): 20 mg via INTRAVENOUS
  Filled 2014-08-26 (×2): qty 2

## 2014-08-26 MED ORDER — ONDANSETRON HCL 4 MG/2ML IJ SOLN: 4.0000 mg | Freq: Four times a day (QID) | INTRAMUSCULAR | Status: DC | PRN

## 2014-08-26 NOTE — Progress Notes (Signed)
OT Cancellation Note  Patient Details Name: Randy Fisher MRN: [id number] DOB: 03/19/1956   Cancelled Treatment:    Reason Eval/Treat Not Completed: Other (comment) Pt with pending doppler results to r/o DVT. Will await results prior to proceeding with OT eval.   Lennox Laity  [phone] 08/26/2014, 12:52 PM

## 2014-08-26 NOTE — Progress Notes (Signed)
Inpatient Diabetes Program Recommendations  AACE/ADA: New Consensus Statement on Inpatient Glycemic Control (2013)  Target Ranges:  Prepandial:   less than 140 mg/dL      Peak postprandial:   less than 180 mg/dL (1-2 hours)      Critically ill patients:  140 - 180 mg/dL   Reason for Assessment: Hyperglycemia (RN requested consult)  Diabetes history: None Outpatient Diabetes medications: None Current orders for Inpatient glycemic control: Novolog sensitive tidwc  59 y.o. male with PMH of alcohol abuse, who presents with back pain, bilateral leg pain, bilateral leg edema, weakness tingling sensations. Pt was seen in urgent care 2 days ago and was given prescription of prednisone and ibuprofen. Blood sugar 221 on BMP on admission. May be due to recent prednisone use or undiagnosed diabetes. HgbA1C pending.  Agree with Novolog sensitive correction tidwc. May need metformin if blood sugars continue > 180 mg/dL. Will f/u with HgbA1C results.  Thank you. Ailene Ards, RD, LDN, CDE Inpatient Diabetes Coordinator [phone]

## 2014-08-26 NOTE — Progress Notes (Signed)
Patient has a Refugee Case Specialist, Silverio Decamp, with [employer] who would like to be called regarding d/c instructions, plans, & medications. Her Number is [phone].

## 2014-08-26 NOTE — Progress Notes (Signed)
*  PRELIMINARY RESULTS* Vascular Ultrasound Lower extremity venous duplex has been completed.  Preliminary findings: negative for DVT  Farrel Demark, RDMS, RVT  08/26/2014, 9:47 AM

## 2014-08-26 NOTE — Progress Notes (Signed)
Patient ID: Randy Fisher, male   DOB: 03/19/1956, 59 y.o.   MRN: [id number]  [facility] PROGRESS NOTE  Randy Fisher [id number] DOB: 03/19/1956 DOA: 08/25/2014 PCP: PROVIDER NOT IN SYSTEM   Brief narrative:    Patient is 59 year old male with known history of alcohol abuse, came to [place] from [place] in January 2016 as a refugee, does not speak English, presented to [facility] emergency department with main concern of 10 days duration of progressively worsening low back pain, constant and sharp, 10/10 in severity, radiating to bilateral lower extremities, worse with ambulation, no specific alleviating factors. In addition patient noted progressively worsening lower extremity swelling, some redness in both ankle areas. This has been associated with numbness and tingling sensation in both lower extremities but worse on the left side. Patient denies fevers and chills, no chest pain or shortness of breath, no cough, no specific abdominal or urinary concerns.  Of note, patient has been seen in urgent care 2 days prior to this admission and was given prescription for prednisone and ibuprofen and reports no improvement in symptoms.  In emergency department, patient noted to be hemodynamically stable, vital signs stable, blood work unremarkable except CBGs in the 200s. Imaging studies included MRI of thoracic and lumbar spine and notable for epidural lipomatosis at L4-5 and L5-S1 narrowing at the central thecal sac, remote compression fractures at L5 and T4, moderate distention of urinary bladder of unclear etiology. Neurologist was consulted by emergency room physician. [facility] asked to admit for further evaluation.  Assessment/Plan:    Principal Problem:   Acute low back pain - Findings of MRI noted below - Provide analgesia as needed, PT evaluation requested - Follow-up on neurology's recommendation  Active Problems:   Bilateral lower extremity edema - This is possibly related to  congestive heart failure as chest x-ray notable for vascular congestion and early CHF - Proceed with echocardiogram for further evaluation - Placed on Lasix 20 mg IV daily for now and monitor clinical response - Weight today is 179 lbs, monitor daily weights, strict I/O    Bilateral lower extremity pain with numbness and tingling - Workup for diabetes in progress as this could be related to neuropathy secondary to diabetes - Placed on Neurontin for now - CBG control with sliding scale insulin for now    Hyperglycemia - Likely diabetic - A1c pending - Continue sliding scale insulin for now and further medical therapy to be determined once A1c is back     History of alcohol abuse - Unclear if patient still actively drinking - Placed on CIWA protocol  DVT prophylaxis -  Heparin SQ   Code Status: Full.  Family Communication:  plan of care discussed with the patient Disposition Plan: Home when stable.   IV access:  Peripheral IV  Procedures and diagnostic studies:     Dg Chest 2 View 08/26/2014 Cardiomegaly. Mild vascular congestion. Early CHF not excluded. Correlate clinically as a cause of extremity swelling.    Mr Lumbar Spine Wo Contrast 08/25/2014    Mild left foraminal narrowing at T1-2 secondary to facet hypertrophy. 2. No significant disc protrusion or or significant stenosis in the remainder of the thoracic spine. 3. Mild left foraminal narrowing at L4-5 secondary to a leftward disc protrusion and annular tear. 4. Multilevel facet hypertrophy in the lumbar spine. 5. 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 6. Epidural lipomatosis is evident at L4-5 and L5-S1 narrowing the central  thecal sac. 7. Moderate distention of the urinary bladder of uncertain etiology. 8. Remote compression fractures of L5 and T4. No acute fractures are present.   Medical Consultants:  Neurology   Other Consultants:  Diabetic educator    IAnti-Infectives:   None   Debbora Presto, MD  [facility] Pager [phone]  If 7PM-7AM, please contact night-coverage [email or web address] Password [email or web address] 08/26/2014, 10:27 AM   LOS: 1 day   HPI/Subjective: No events overnight.   Objective: Filed Vitals:   08/26/14 0119 08/26/14 0254 08/26/14 0536 08/26/14 0706  BP: 159/84 147/82 125/72   Pulse: 78 81 74   Temp: 97.6 F (36.4 C) 97.8 F (36.6 C) 98.2 F (36.8 C)   TempSrc: Oral Oral Oral   Resp: 18 18 18    Height:    5\' 9"  (1.753 m)  Weight:   81.2 kg (179 lb 0.2 oz)   SpO2: 100% 99% 100%     Intake/Output Summary (Last 24 hours) at 08/26/14 1027 Last data filed at 08/26/14 0932  Gross per 24 hour  Intake    300 ml  Output      0 ml  Net    300 ml    Exam:   General:  Pt is alert, follows commands appropriately, not in acute distress  Cardiovascular: Regular rate and rhythm, S1/S2, no murmurs, no rubs, no gallops  Respiratory: Clear to auscultation bilaterally, no wheezing, bibasilar crackles   Abdomen: Soft, non tender, non distended, bowel sounds present, no guarding  Extremities: bilateral lower extremity edema, +2 pitting, no tenderness to palpation   Neuro: Grossly nonfocal  Data Reviewed: Basic Metabolic Panel:  Recent Labs Lab 08/25/14 1856 08/26/14 0545  NA 139 138  K 4.4 3.8  CL 105 108  CO2 24 26  GLUCOSE 221* 174*  BUN 17 17  CREATININE 0.92 0.87  CALCIUM 9.0 8.5*   Liver Function Tests:  Recent Labs Lab 08/25/14 1856  AST 19  ALT 33  ALKPHOS 60  BILITOT 0.4  PROT 7.7  ALBUMIN 4.0   CBC:  Recent Labs Lab 08/25/14 1856 08/26/14 0545  WBC 8.1 8.8  NEUTROABS 6.7  --   HGB 13.7 12.5*  HCT 41.4 38.1*  MCV 83.0 82.1  PLT 193 194   Cardiac Enzymes:  Recent Labs Lab 08/25/14 1856  CKTOTAL 62   CBG:  Recent Labs Lab 08/26/14 0727  GLUCAP 132*   Scheduled Meds: . heparin  5,000 Units Subcutaneous 3 times per day  . insulin aspart  0-9 Units Subcutaneous TID WC  .  sodium chloride  3 mL Intravenous Q12H   Continuous Infusions:

## 2014-08-26 NOTE — ED Notes (Signed)
Pt given a turkey sandwich and apple juice 

## 2014-08-26 NOTE — Evaluation (Signed)
Physical Therapy Evaluation Patient Details Name: Josie Mesa MRN: [id number] DOB: 03/19/1956 IntereToday's Date: 08/26/2014   History of Present Illness  Knox Cervi is an 59 y.o. male with known history of alcohol abuse who recently came to the [place] from the [place] in January 2016. He does not speak [place]. Presents to the ED 08/25/14 with 10 day history of progressively worsening lower extremity pain. Notes pain is predominantly in his feet but also at times will start in lumbar region and radiate distally. Distal pain is a burning paresthesia that is worse at night, causing severe pain when he attempts to walk. MRI demonstrates lipomatosis L45, L5S1  Clinical Impression  Interpreter service utilized.  Patient ambulated  X 20', very slowly. Patient indicated that he could walk better with shoes. No shoes available. Patient  Ambulated and stood  With R foot supinated, unable to get info about pain in feet specifically. Patient has a flight of steps at home. Patient will benefit from PT to address problems listed in note below.(PT Problem List)    Follow Up Recommendations Home health PT;Supervision/Assistance - 24 hour    Equipment Recommendations  Rolling walker with 5" wheels    Recommendations for Other Services       Precautions / Restrictions Precautions Precautions: Fall      Mobility  Bed Mobility Overal bed mobility: Needs Assistance Bed Mobility: Supine to Sit;Sit to Supine     Supine to sit: Supervision Sit to supine: Supervision   General bed mobility comments: moves slowly, extra effort to get legs onto bed.  Transfers Overall transfer level: Needs assistance Equipment used: Rolling walker (2 wheeled) Transfers: Sit to/from Stand Sit to Stand: Mod assist;From elevated surface;+2 safety/equipment         General transfer comment: cues for hand placement, assist to powerup.,  Ambulation/Gait Ambulation/Gait assistance: +2 safety/equipment;Mod  assist Ambulation Distance (Feet): 20 Feet Assistive device: Rolling walker (2 wheeled) Gait Pattern/deviations: Step-to pattern;Decreased stride length;Shuffle Gait velocity: slow   General Gait Details: initially appeared that R knee was hyperextending, patient  supinates R foot and weight bears on the side.  Stairs            Wheelchair Mobility    Modified Rankin (Stroke Patients Only)       Balance Overall balance assessment: History of Falls;Needs assistance Sitting-balance support: Feet supported;No upper extremity supported Sitting balance-Leahy Scale: Fair Sitting balance - Comments: able to don pants while sitting on the edge, Postural control: Posterior lean Standing balance support: During functional activity;Bilateral upper extremity supported Standing balance-Leahy Scale: Poor                               Pertinent Vitals/Pain Pain Assessment: Faces Faces Pain Scale: Hurts even more Pain Location: both feet, difficult to get info via interpreter, patient states that if he had his shoes he could walk better. Pain Descriptors / Indicators: Discomfort;Grimacing;Guarding Pain Intervention(s): Limited activity within patient's tolerance;Monitored during session    Home Living Family/patient expects to be discharged to:: Private residence Living Arrangements: Children Available Help at Discharge: Family Type of Home: Apartment Home Access: Stairs to enter   Secretary/administrator of Steps: 12 with a rail Home Layout: One level Home Equipment: None      Prior Function           Comments: through interpreter present, pt has been crawling around his home for the past day as he cant tolerate  walking/weightbearing. Pt has been having difficulty mobilizing prior to this also but worse in the last day     Hand Dominance        Extremity/Trunk Assessment               Lower Extremity Assessment: RLE deficits/detail;LLE  deficits/detail RLE Deficits / Details: moves  slowly, stands on outside of foot,  LLE Deficits / Details: stands on flat foot,  Movements are slow, appears WFL strength  Cervical / Trunk Assessment: Other exceptions  Communication   Communication: Prefers language other than English ([place]ili)  Cognition Arousal/Alertness: Awake/alert Behavior During Therapy: WFL for tasks assessed/performed Overall Cognitive Status: Difficult to assess                      General Comments      Exercises        Assessment/Plan    PT Assessment Patient needs continued PT services  PT Diagnosis Difficulty walking;Abnormality of gait;Generalized weakness;Acute pain   PT Problem List Decreased strength;Decreased range of motion;Decreased activity tolerance;Decreased balance;Decreased mobility;Decreased knowledge of precautions;Decreased safety awareness;Decreased knowledge of use of DME  PT Treatment Interventions DME instruction;Gait training;Stair training;Functional mobility training;Therapeutic activities;Therapeutic exercise;Patient/family education   PT Goals (Current goals can be found in the Care Plan section) Acute Rehab PT Goals Patient Stated Goal: did not state, via interpreter, agreeable to walk PT Goal Formulation: With patient Time For Goal Achievement: 09/09/14 Potential to Achieve Goals: Good    Frequency Min 3X/week   Barriers to discharge        Co-evaluation               End of Session Equipment Utilized During Treatment: Gait belt Activity Tolerance: Patient tolerated treatment well Patient left: in bed;with call bell/phone within reach;with bed alarm set Nurse Communication: Mobility status         Time: 1420-1450 PT Time Calculation (min) (ACUTE ONLY): 30 min   Charges:   PT Evaluation $Initial PT Evaluation Tier I: 1 Procedure PT Treatments $Gait Training: 8-22 mins   PT G Codes:        Rada Hay 08/26/2014, 3:48 PM Blanchard Kelch PT [phone]

## 2014-08-26 NOTE — Progress Notes (Signed)
Echocardiogram 2D Echocardiogram has been performed.  Randy Fisher 08/26/2014, 12:21 PM

## 2014-08-26 NOTE — Consult Note (Signed)
Consult Reason for Consult:lower back pain Referring Physician:   CC: lower back pain  HPI: Randy Fisher is an 59 y.o. male with known history of alcohol abuse who recently came to the [place] from the [place] in January 2016. History obtained via translator as he does not speak [place]. Presents to the ED with 10 day history of progressively worsening lower extremity pain. Notes pain is predominantly in his feet but also at times will start in lumbar region and radiate distally. Distal pain is a burning paresthesia that is worse at night, causing severe pain when he attempts to walk. Denies any saddle anesthesia or bowel/bladder dysfunction. Reports remote history of trauma to his back when he was 13.   Recently evaluated at urgent care, given prednisone taper with no improvement. MRI T and L spine imaging reviewed, shows epidural lipomatosis at L4-5 and L5-S1 with narrowing of the central thecal sac along with multilevel degenerative changes but no frank cord or nerve root impingement noted.   Past Medical History  Diagnosis Date  . Alcohol abuse     History reviewed. No pertinent past surgical history.  No family history on file.  Social History:  reports that he has never smoked. He does not have any smokeless tobacco history on file. He reports that he drinks alcohol. His drug history is not on file.  Not on File  Medications:  Scheduled: . furosemide  20 mg Intravenous Daily  . heparin  5,000 Units Subcutaneous 3 times per day  . insulin aspart  0-9 Units Subcutaneous TID WC  . sodium chloride  3 mL Intravenous Q12H    ROS: Out of a complete 14 system review, the patient complains of only the following symptoms, and all other reviewed systems are negative. +pain  Physical Examination: Filed Vitals:   08/26/14 0536  BP: 125/72  Pulse: 74  Temp: 98.2 F (36.8 C)  Resp: 18   Physical Exam  Constitutional: He appears well-developed and well-nourished.  Psych: Affect  appropriate to situation Eyes: No scleral injection HENT: No OP obstrucion Head: Normocephalic.  Cardiovascular: Normal rate and regular rhythm.  Respiratory: Effort normal and breath sounds normal.  GI: Soft. Bowel sounds are normal. No distension. There is no tenderness.  Skin: WDI  Neurologic Examination Mental Status: (obtained via translator) Alert, oriented, thought content appropriate.  Speech fluent without evidence of aphasia.  Able to follow 3 step commands without difficulty. Cranial Nerves: II: optic discs not visualized, visual fields grossly normal, pupils equal, round, reactive to light III,IV, VI: ptosis not present, extra-ocular motions intact bilaterally V,VII: smile symmetric, facial light touch sensation normal bilaterally VIII: hearing normal bilaterally IX,X: gag reflex present XI: trapezius strength/neck flexion strength normal bilaterally XII: tongue strength normal  Motor: Right : Upper extremity    Left:     Upper extremity 5/5 deltoid       5/5 deltoid 5/5 biceps      5/5 biceps  5/5 triceps      5/5 triceps 5/5 hand grip      5/5 hand grip  Lower extremity     Lower extremity 5/5 hip flexor      5/5 hip flexor 5/5 quadricep      5/5 quadriceps  5/5 hamstrings     5/5 hamstrings 5/5 plantar flexion       5/5 plantar flexion 5/5 plantar extension     5/5 plantar extension Tone and bulk:normal tone throughout; no atrophy noted Sensory: Pinprick and light touch intact  throughout, bilaterally Deep Tendon Reflexes: 2+ and symmetric throughout Plantars: Right: downgoing   Left: downgoing Cerebellar: normal finger-to-nose, and normal heel-to-shin test Gait: deferred  Laboratory Studies:   Basic Metabolic Panel:  Recent Labs Lab 08/25/14 1856 08/26/14 0545  NA 139 138  K 4.4 3.8  CL 105 108  CO2 24 26  GLUCOSE 221* 174*  BUN 17 17  CREATININE 0.92 0.87  CALCIUM 9.0 8.5*    Liver Function Tests:  Recent Labs Lab 08/25/14 1856  AST  19  ALT 33  ALKPHOS 60  BILITOT 0.4  PROT 7.7  ALBUMIN 4.0   No results for input(s): LIPASE, AMYLASE in the last 168 hours. No results for input(s): AMMONIA in the last 168 hours.  CBC:  Recent Labs Lab 08/25/14 1856 08/26/14 0545  WBC 8.1 8.8  NEUTROABS 6.7  --   HGB 13.7 12.5*  HCT 41.4 38.1*  MCV 83.0 82.1  PLT 193 194    Cardiac Enzymes:  Recent Labs Lab 08/25/14 1856  CKTOTAL 62    BNP: Invalid input(s): POCBNP  CBG:  Recent Labs Lab 08/26/14 0727  GLUCAP 132*    Microbiology: No results found for this or any previous visit.  Coagulation Studies:  Recent Labs  08/26/14 0150  LABPROT 14.7  INR 1.13    Urinalysis:  Recent Labs Lab 08/25/14 2150  COLORURINE YELLOW  LABSPEC 1.009  PHURINE 6.5  GLUCOSEU NEGATIVE  HGBUR NEGATIVE  BILIRUBINUR NEGATIVE  KETONESUR NEGATIVE  PROTEINUR NEGATIVE  UROBILINOGEN 1.0  NITRITE NEGATIVE  LEUKOCYTESUR NEGATIVE    Lipid Panel:  No results found for: CHOL, TRIG, HDL, CHOLHDL, VLDL, LDLCALC  HgbA1C: No results found for: HGBA1C  Urine Drug Screen:     Component Value Date/Time   LABOPIA POSITIVE* 08/26/2014 0720   COCAINSCRNUR NONE DETECTED 08/26/2014 0720   LABBENZ NONE DETECTED 08/26/2014 0720   AMPHETMU NONE DETECTED 08/26/2014 0720   THCU NONE DETECTED 08/26/2014 0720   LABBARB NONE DETECTED 08/26/2014 0720    Alcohol Level: No results for input(s): ETH in the last 168 hours.   Imaging: Dg Chest 2 View  08/26/2014   CLINICAL DATA:  Extremity swelling.  EXAM: CHEST  2 VIEW  COMPARISON:  None.  FINDINGS: Low lung volumes. Cardiomegaly. Mild vascular congestion. No effusion or pneumothorax. Bones unremarkable. Chronic compression deformities as reported previously on MR. Both lungs are clear. The visualized skeletal structures are unremarkable.  IMPRESSION: Cardiomegaly. Mild vascular congestion. Early CHF not excluded. Correlate clinically as a cause of extremity swelling.   Electronically  Signed   By: Davonna Belling M.D.   On: 08/26/2014 01:26   Mr Thoracic Spine Wo Contrast  08/25/2014   CLINICAL DATA:  Unable to ambulate. Bilateral lower leg pain. Low back pain extending into both lower extremities. The pain began last week.  EXAM: MRI THORACIC AND LUMBAR SPINE WITHOUT CONTRAST  TECHNIQUE: Multiplanar and multiecho pulse sequences of the thoracic and lumbar spine were obtained without intravenous contrast.  COMPARISON:  None available.  FINDINGS: MR THORACIC SPINE FINDINGS  The study is mildly degraded by patient motion. Normal signal is present within the thoracic spinal cord. Marrow signal, marrow signal is normal. A remote superior endplate fracture is present at T4. Vertebral body heights are otherwise normal. Alignment is anatomic. No significant disc protrusion or stenosis is present. Asymmetric left-sided facet hypertrophy narrows the foramen at T1-2. The foramina are otherwise patent bilaterally. The paraspinous soft tissues are normal.  MR LUMBAR SPINE FINDINGS  The  conus medullaris scratch the normal signal is present in the conus medullaris which terminates at L1-2. A remote superior endplate fracture is present at L5. No acute fractures are present. Marrow signal and vertebral body heights are otherwise normal. Limited imaging of the abdomen is unremarkable.  L1-2: Mild facet hypertrophy is present bilaterally without significant stenosis.  L2-3: A leftward disc bulge is present. Mild facet hypertrophy is noted bilaterally. There is no significant stenosis.  L3-4: Mild disc bulging and facet hypertrophy is present without significant stenosis.  L4-5: A left lateral disc protrusion and annular tear is noted. Moderate facet hypertrophy is present bilaterally. This leads to mild left foraminal stenosis. The central canal is patent. Prominent epidural fat is noted.  L5-S1: Mild facet hypertrophy is present. Prominent epidural fat is noted.  The urinary bladder is distended, extending  superiorly to the level of L4-5.  IMPRESSION: MR THORACIC SPINE IMPRESSION  1. Mild left foraminal narrowing at T1-2 secondary to facet hypertrophy. 2. No significant disc protrusion or or significant stenosis in the remainder of the thoracic spine. 3. Mild left foraminal narrowing at L4-5 secondary to a leftward disc protrusion and annular tear. 4. Multilevel facet hypertrophy in the lumbar spine. 5. 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 6. 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 7. Moderate distention of the urinary bladder of uncertain etiology. 8. Remote compression fractures of L5 and T4. No acute fractures are present.  MR LUMBAR SPINE IMPRESSION   Electronically Signed   By: Marin Roberts M.D.   On: 08/25/2014 21:01   Mr Lumbar Spine Wo Contrast  08/25/2014   CLINICAL DATA:  Unable to ambulate. Bilateral lower leg pain. Low back pain extending into both lower extremities. The pain began last week.  EXAM: MRI THORACIC AND LUMBAR SPINE WITHOUT CONTRAST  TECHNIQUE: Multiplanar and multiecho pulse sequences of the thoracic and lumbar spine were obtained without intravenous contrast.  COMPARISON:  None available.  FINDINGS: MR THORACIC SPINE FINDINGS  The study is mildly degraded by patient motion. Normal signal is present within the thoracic spinal cord. Marrow signal, marrow signal is normal. A remote superior endplate fracture is present at T4. Vertebral body heights are otherwise normal. Alignment is anatomic. No significant disc protrusion or stenosis is present. Asymmetric left-sided facet hypertrophy narrows the foramen at T1-2. The foramina are otherwise patent bilaterally. The paraspinous soft tissues are normal.  MR LUMBAR SPINE FINDINGS  The conus medullaris scratch the normal signal is present in the conus medullaris which terminates at L1-2. A remote superior endplate fracture is  present at L5. No acute fractures are present. Marrow signal and vertebral body heights are otherwise normal. Limited imaging of the abdomen is unremarkable.  L1-2: Mild facet hypertrophy is present bilaterally without significant stenosis.  L2-3: A leftward disc bulge is present. Mild facet hypertrophy is noted bilaterally. There is no significant stenosis.  L3-4: Mild disc bulging and facet hypertrophy is present without significant stenosis.  L4-5: A left lateral disc protrusion and annular tear is noted. Moderate facet hypertrophy is present bilaterally. This leads to mild left foraminal stenosis. The central canal is patent. Prominent epidural fat is noted.  L5-S1: Mild facet hypertrophy is present. Prominent epidural fat is noted.  The urinary bladder is distended, extending superiorly to the level of L4-5.  IMPRESSION: MR THORACIC SPINE IMPRESSION  1. Mild left foraminal narrowing at T1-2 secondary to facet  hypertrophy. 2. No significant disc protrusion or or significant stenosis in the remainder of the thoracic spine. 3. Mild left foraminal narrowing at L4-5 secondary to a leftward disc protrusion and annular tear. 4. Multilevel facet hypertrophy in the lumbar spine. 5. 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 6. 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 7. Moderate distention of the urinary bladder of uncertain etiology. 8. Remote compression fractures of L5 and T4. No acute fractures are present.  MR LUMBAR SPINE IMPRESSION   Electronically Signed   By: Marin Roberts M.D.   On: 08/25/2014 21:01     Assessment/Plan:  58y/o gentleman with hx of EtOH abuse, refuge from [place] presenting for evaluation of severe lower extremity pain. Reports symptoms consistent with both a peripheral neuropathy and a possible lumbar radiculopathy. History and exam not consistent with cord compression or cauda  equina presentation. MRI does show lumbar epidural lipomatosis which may be contributing to his symptoms.  -check B12, ANA, TSH, HbA1c, SPEP, B1 -outpatient neurology follow up for EMG/NCS. Referral has been placed to see Dr Lucia Gaskins at [facility] -start gabapentin  TID for symptomatic relief -rehab evaluation -no further inpatient neurological workup indicated at this time -findings and plan discussed with patients [facility] Case Specialist, Silverio Decamp at [phone]  Elspeth Cho, DO [facility] [phone]  If 7pm- 7am, please page neurology on call as listed in AMION. 08/26/2014, 11:07 AM

## 2014-08-27 DIAGNOSIS — R600 Localized edema: Secondary | ICD-10-CM

## 2014-08-27 DIAGNOSIS — R739 Hyperglycemia, unspecified: Secondary | ICD-10-CM

## 2014-08-27 LAB — PROTEIN ELECTROPHORESIS, SERUM
A/G Ratio: 1.2 (ref 0.7–2.0)
ALPHA-2-GLOBULIN: 0.6 g/dL (ref 0.4–1.2)
Albumin ELP: 3.7 g/dL (ref 2.9–4.4)
Alpha-1-Globulin: 0.1 g/dL (ref 0.1–0.4)
Beta Globulin: 0.9 g/dL (ref 0.6–1.3)
GAMMA GLOBULIN: 1.6 g/dL (ref 0.5–1.6)
GLOBULIN, TOTAL: 3.2 g/dL (ref 2.0–4.5)
Total Protein ELP: 6.9 g/dL (ref 6.0–8.5)

## 2014-08-27 LAB — HEMOGLOBIN A1C
Hgb A1c MFr Bld: 7.3 % — ABNORMAL HIGH (ref 4.8–5.6)
Mean Plasma Glucose: 163 mg/dL

## 2014-08-27 LAB — GLUCOSE, CAPILLARY
Glucose-Capillary: 117 mg/dL — ABNORMAL HIGH (ref 65–99)
Glucose-Capillary: 175 mg/dL — ABNORMAL HIGH (ref 65–99)

## 2014-08-27 MED ORDER — METFORMIN HCL 500 MG PO TABS
500.0000 mg | ORAL_TABLET | Freq: Two times a day (BID) | ORAL | Status: DC
  Filled 2014-08-27 (×2): qty 1

## 2014-08-27 MED ORDER — OXYCODONE-ACETAMINOPHEN 5-325 MG PO TABS: 1.0000 | ORAL_TABLET | ORAL | Status: DC | PRN

## 2014-08-27 MED ORDER — FUROSEMIDE 20 MG PO TABS: 20.0000 mg | ORAL_TABLET | Freq: Every day | ORAL | Status: DC

## 2014-08-27 MED ORDER — GABAPENTIN 100 MG PO CAPS: 100.0000 mg | ORAL_CAPSULE | Freq: Three times a day (TID) | ORAL | Status: DC

## 2014-08-27 MED ORDER — METFORMIN HCL 500 MG PO TABS: 500.0000 mg | ORAL_TABLET | Freq: Two times a day (BID) | ORAL | Status: DC

## 2014-08-27 NOTE — Plan of Care (Signed)
Problem: Phase I Progression Outcomes Goal: Initial discharge plan identified Outcome: Completed/Met Date Met:  08/27/14 Plan is for pt to go home with children.

## 2014-08-27 NOTE — Progress Notes (Signed)
Physical Therapy Treatment Patient Details Name: Randy Fisher MRN: [id number] DOB: 03/19/1956 Today's Date: 08/27/2014    History of Present Illness Randy Fisher is an 59 y.o. male with known history of alcohol abuse who recently came to the [place] from the [place] in January 2016. He does not speak [place]. Presents to the ED 08/25/14 with 10 day history of progressively worsening lower extremity pain. Notes pain is predominantly in his feet but also at times will start in lumbar region and radiate distally. Distal pain is a burning paresthesia that is worse at night, causing severe pain when he attempts to walk. MRI demonstrates lipomatosis L45, L5S1    PT Comments    Assisted pt OOB to amb to BR.  Pt able to perform self toileting.  Assisted with amb in hallway using RW due to mild balance deficit. Pt tolerated amb a great distance.  Positioned in recliner.   Follow Up Recommendations  Home health PT;Supervision/Assistance - 24 hour     Equipment Recommendations  Rolling walker with 5" wheels    Recommendations for Other Services       Precautions / Restrictions Precautions Precautions: Fall Precaution Comments: Non English speaking Restrictions Weight Bearing Restrictions: No    Mobility  Bed Mobility Overal bed mobility: Needs Assistance Bed Mobility: Supine to Sit     Supine to sit: Supervision     General bed mobility comments: increased time  Transfers Overall transfer level: Needs assistance Equipment used: Rolling walker (2 wheeled) Transfers: Sit to/from Stand Sit to Stand: Supervision;Min guard         General transfer comment: good use of hands to steady self   Ambulation/Gait Ambulation/Gait assistance: Min assist Ambulation Distance (Feet): 150 Feet Assistive device: Rolling walker (2 wheeled) Gait Pattern/deviations: Step-through pattern;Trunk flexed Gait velocity: decreased    General Gait Details: good alternating gait with needed assist from  walker for balance   Stairs            Wheelchair Mobility    Modified Rankin (Stroke Patients Only)       Balance                                    Cognition Arousal/Alertness: Awake/alert Behavior During Therapy: WFL for tasks assessed/performed Overall Cognitive Status: Within Functional Limits for tasks assessed                      Exercises      General Comments        Pertinent Vitals/Pain Pain Assessment: No/denies pain    Home Living                      Prior Function            PT Goals (current goals can now be found in the care plan section) Progress towards PT goals: Progressing toward goals    Frequency  Min 3X/week    PT Plan      Co-evaluation             End of Session Equipment Utilized During Treatment: Gait belt Activity Tolerance: Patient tolerated treatment well Patient left: in chair;with chair alarm set;with call bell/phone within reach     Time: 0945-1010 PT Time Calculation (min) (ACUTE ONLY): 25 min  Charges:  $Gait Training: 8-22 mins $Therapeutic Activity: 8-22 mins  G Codes:      Randy Fisher  PTA [facility]  Acute  Rehab Pager      [phone]

## 2014-08-27 NOTE — Care Management Note (Addendum)
Case Management Note  Patient Details  Name: Randy Fisher MRN: [id number] Date of Birth: 03/19/1956  Subjective/Objective:      59 yo male admitted with bilateral leg pain              Action/Plan: Randy Fisher with interpreter services provided interpretation for the patient (swaheli). The patient was informed of HH services for RN, aide, and SW but that he is not eligible for PT services with Medicaid. Also explained that the rolling walker and 3in1 will be delivered to the room for him to take home. He stated that he does not have transportation home so CSW was notified and will provide taxi services. Discussed Medicaid transportation with the patient and he stated that he was not aware of the service. This CM explained that a message was left for Randy Fisher, [phone], his refugee care specialist with [facility], and when she returns the call his discharge instructions, HH, and other information will be provided to her for added support. Patient confirmed his address at [address] and contact [phone]. [facility] rep, Randy Fisher aware of [facility] referral and that they will need to arrange visits through [facility] for interpreter services. Randy Fisher, [facility] DME rep also aware of DME order. Staff nurse aware of plan. Patient has no other questions or concerns.  L/M with the refugee care specialist, Randy Fisher, with [facility] and DME information, and requested return call. Interpreter scheduled for 11:00am, will discuss HH services and DME with patient at that time.  Expected Discharge Date:                  Expected Discharge Plan:  Home w Home Health Services  In-House Referral:     Discharge planning Services  CM Consult  Post Acute Care Choice:  Durable Medical Equipment Choice offered to:  Patient  DME Arranged:  Walker rolling, 3in1 DME Agency:  [facility]  HH Arranged:  RN, Nurse's Aide HH Agency:  [facility]  Status of Service:  In process, will continue to  follow  Medicare Important Message Given:    Date Medicare IM Given:    Medicare IM give by:    Date Additional Medicare IM Given:    Additional Medicare Important Message give by:     If discussed at Long Length of Stay Meetings, dates discussed:    Additional Comments:  Randy Halls, RN 08/27/2014, 10:49 AM

## 2014-08-27 NOTE — Discharge Instructions (Signed)

## 2014-08-27 NOTE — Evaluation (Signed)
Occupational Therapy Evaluation Patient Details Name: Randy Fisher MRN: [id number] DOB: 03/19/1956 Today's Date: 08/27/2014    History of Present Illness Raju Coppolino is an 59 y.o. male with known history of alcohol abuse who recently came to the [place] from the [place] in January 2016. He does not speak [place]. Presents to the ED 08/25/14 with 10 day history of progressively worsening lower extremity pain. Notes pain is predominantly in his feet but also at times will start in lumbar region and radiate distally. Distal pain is a burning paresthesia that is worse at night, causing severe pain when he attempts to walk. MRI demonstrates lipomatosis L45, L5S1   Clinical Impression   Pt was admitted for the above.  Pt is overall set up/supervision for ADLs and min guard for ambulation to bathroom.  Pt needed min A to stand up from comfort height commode and he has a standard commode at home.  Will benefit from 3:1 over toilet    Follow Up Recommendations  No OT follow up;Supervision/Assistance - 24 hour    Equipment Recommendations  3 in 1 bedside comode    Recommendations for Other Services       Precautions / Restrictions Precautions Precautions: Fall Precaution Comments: Non English speaking Restrictions Weight Bearing Restrictions: No      Mobility Bed Mobility Overal bed mobility: Needs Assistance Bed Mobility: Supine to Sit     Supine to sit: Supervision     General bed mobility comments: OOB  Transfers Overall transfer level: Needs assistance Equipment used: Rolling walker (2 wheeled) Transfers: Sit to/from Stand Sit to Stand: Supervision         General transfer comment: cues for UE placement    Balance                                            ADL Overall ADL's : Needs assistance/impaired             Lower Body Bathing: Sit to/from stand;Supervison/ safety       Lower Body Dressing: Supervision/safety;Sit to/from stand   Toilet  Transfer: Minimal assistance;Ambulation;Comfort height toilet;Grab bars             General ADL Comments: pt is able to perform UB adls with set up.  Cues for UE placement with walker for safety for sit to stand for LB ADLs.  Pt had increased heel pain when ambulating and tended to walk on balls of feet.  Pt has a standard commode at home:  he had difficulty getting up from comfort height. Will benefit from 3:1 over commode--demonstrated this.  Recommended that pt sponge bathe initially.  He will not be able to tolerate stepping over tub at this time as he was unweighting heels when walking.  Min guard assistance ambulating to bathroom--no LOB     Vision     Perception     Praxis      Pertinent Vitals/Pain Pain Assessment: 0-10 Pain Score: 5  Pain Location: bil LEs, especially heels when weight bearing Pain Intervention(s): Limited activity within patient's tolerance;Monitored during session     Hand Dominance     Extremity/Trunk Assessment Upper Extremity Assessment Upper Extremity Assessment: Overall WFL for tasks assessed           Communication Communication Communication: Prefers language other than [place];Interpreter utilized   Cognition Arousal/Alertness: Awake/alert Behavior During Therapy: WFL for tasks  assessed/performed Overall Cognitive Status: Within Functional Limits for tasks assessed                     General Comments       Exercises       Shoulder Instructions      Home Living Family/patient expects to be discharged to:: Private residence Living Arrangements: Children Available Help at Discharge:  (in and out, go to school)               Bathroom Shower/Tub: Tub/shower unit Shower/tub characteristics: Engineer, building services: Standard     Home Equipment: None          Prior Functioning/Environment Level of Independence: Independent             OT Diagnosis: Acute pain   OT Problem List:     OT  Treatment/Interventions:      OT Goals(Current goals can be found in the care plan section) Acute Rehab OT Goals Patient Stated Goal: none stated; agreeable to OT  OT Frequency:     Barriers to D/C:            Co-evaluation              End of Session    Activity Tolerance: Patient tolerated treatment well Patient left: in chair;with call bell/phone within reach;with family/visitor present   Time: 8676-1950 OT Time Calculation (min): 16 min Charges:  OT General Charges $OT Visit: 1 Procedure OT Evaluation $Initial OT Evaluation Tier I: 1 Procedure G-Codes:    Marguerite Barba Aug 31, 2014, 12:28 PM Marica Otter, OTR/L [phone] 08-31-2014
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
## 2014-08-28 LAB — HIV ANTIBODY (ROUTINE TESTING W REFLEX): HIV Screen 4th Generation wRfx: REACTIVE — AB

## 2014-08-28 LAB — VITAMIN B1: Vitamin B1 (Thiamine): 68.2 nmol/L (ref 66.5–200.0)

## 2014-08-28 LAB — HIV 1/2 AB DIFFERENTIATION
HIV 1 Ab: NONREACTIVE
HIV 2 Ab: NONREACTIVE

## 2014-08-28 LAB — RNA QUALITATIVE: HIV 1 RNA QUALITATIVE: NEGATIVE

## 2014-09-02 ENCOUNTER — Encounter (HOSPITAL_COMMUNITY): Payer: Self-pay | Admitting: *Deleted

## 2014-09-13 ENCOUNTER — Ambulatory Visit: Payer: Medicaid Other

## 2014-10-11 ENCOUNTER — Ambulatory Visit (INDEPENDENT_AMBULATORY_CARE_PROVIDER_SITE_OTHER): Payer: Medicaid Other | Admitting: Family Medicine

## 2014-10-11 VITALS — BP 125/82 | HR 71 | Temp 98.3°F | Ht 69.0 in | Wt 172.0 lb

## 2014-10-11 DIAGNOSIS — R600 Localized edema: Secondary | ICD-10-CM | POA: Diagnosis not present

## 2014-10-11 DIAGNOSIS — M67879 Other specified disorders of synovium and tendon, unspecified ankle and foot: Secondary | ICD-10-CM

## 2014-10-11 DIAGNOSIS — Z008 Encounter for other general examination: Secondary | ICD-10-CM

## 2014-10-11 DIAGNOSIS — M7989 Other specified soft tissue disorders: Secondary | ICD-10-CM | POA: Diagnosis not present

## 2014-10-11 DIAGNOSIS — Z0289 Encounter for other administrative examinations: Secondary | ICD-10-CM

## 2014-10-11 DIAGNOSIS — Z789 Other specified health status: Secondary | ICD-10-CM | POA: Diagnosis not present

## 2014-10-11 DIAGNOSIS — M7660 Achilles tendinitis, unspecified leg: Secondary | ICD-10-CM

## 2014-10-11 LAB — BASIC METABOLIC PANEL
BUN: 14 mg/dL (ref 7–25)
CHLORIDE: 109 mmol/L (ref 98–110)
CO2: 24 mmol/L (ref 20–31)
CREATININE: 0.81 mg/dL (ref 0.70–1.25)
Calcium: 9.3 mg/dL (ref 8.6–10.3)
GLUCOSE: 74 mg/dL (ref 65–99)
POTASSIUM: 4.1 mmol/L (ref 3.5–5.3)
Sodium: 142 mmol/L (ref 135–146)

## 2014-10-11 MED ORDER — METFORMIN HCL 500 MG PO TABS: 500.0000 mg | ORAL_TABLET | Freq: Two times a day (BID) | ORAL | Status: DC

## 2014-10-11 MED ORDER — FUROSEMIDE 20 MG PO TABS
20.0000 mg | ORAL_TABLET | Freq: Every day | ORAL | Status: DC
Start: 2014-10-11 — End: 2015-01-11

## 2014-10-11 MED ORDER — GABAPENTIN 100 MG PO CAPS: 100.0000 mg | ORAL_CAPSULE | Freq: Three times a day (TID) | ORAL | Status: DC

## 2014-10-11 MED ORDER — IBUPROFEN 600 MG PO TABS: 600.0000 mg | ORAL_TABLET | Freq: Three times a day (TID) | ORAL | Status: DC | PRN

## 2014-10-11 NOTE — Assessment & Plan Note (Signed)
BMET today Continue Lasix

## 2014-10-11 NOTE — Assessment & Plan Note (Signed)
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

## 2014-10-11 NOTE — Patient Instructions (Signed)
Nice to meet you today.  For your foot pain, you can take Ibuprofen 2-3 times daily as needed for pain.  You should do the stretches that we showed you 2-3 times daily for 30 seconds at a time.  Come back to see me in 3-4 weeks to see how the pain is doing.  Take Care, Dr. Beryle Flock
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
## 2014-10-13 NOTE — Addendum Note (Signed)
Addended byGwendolyn Grant, Newt Lukes on: 10/13/2014 01:42 PM   Modules accepted: Level of Service

## 2014-10-29 ENCOUNTER — Telehealth: Payer: Self-pay | Admitting: Family Medicine

## 2014-10-29 NOTE — Telephone Encounter (Signed)
Form placed in PCP box. Zimmerman Rumple, Quadir Muns D, CMA  

## 2014-10-29 NOTE — Telephone Encounter (Signed)
Surveyor, minerals brought in dental form to be completed Will pick up when complete

## 2014-11-01 ENCOUNTER — Ambulatory Visit: Payer: Self-pay | Admitting: Family Medicine

## 2014-11-01 NOTE — Telephone Encounter (Signed)
Form completed and given to Tamika.  [name], MD, MPH PGY-2,  [facility] 11/01/2014 1:56 PM

## 2014-11-02 NOTE — Telephone Encounter (Signed)
Called to inform [facility] that form was complete, however phone was disconnected.  Clovis Pu, RN

## 2014-11-23 ENCOUNTER — Telehealth: Payer: Self-pay | Admitting: Family Medicine

## 2014-11-23 NOTE — Telephone Encounter (Signed)
Lois from [employer] called about Randy Fisher and the medications he have been placed on.  Randy Fisher has apparently gotten his dosage mixed up.  Taking his lasix more times than prescribed and didn't have any gabapentin medication anywhere in home.  Concerned that he's not taking his medication properly.   Been c/o of leg pain and chest pain. Please call her back to discuss the situation.  Will schedule him with a red team provider so we can get him back on schedule with his medication.

## 2014-11-24 NOTE — Telephone Encounter (Signed)
Attempted to call [facility] back, but no answer. Left VM asking for patient to schedule appt asap in clinic to be seen.  Also advised that he may need to go to ED for chest pain.  Asked RN to call clinic back to further discuss.  [name], MD, MPH PGY-2,  [facility] 11/24/2014 3:41 PM

## 2014-11-26 ENCOUNTER — Ambulatory Visit (INDEPENDENT_AMBULATORY_CARE_PROVIDER_SITE_OTHER): Payer: Medicaid Other | Admitting: Family Medicine

## 2014-11-26 ENCOUNTER — Encounter: Payer: Self-pay | Admitting: Family Medicine

## 2014-11-26 ENCOUNTER — Ambulatory Visit (HOSPITAL_COMMUNITY)
Admission: RE | Admit: 2014-11-26 | Discharge: 2014-11-26 | Disposition: A | Discharge status: Home / Self Care | Payer: Medicaid Other | Source: Ambulatory Visit | Attending: Family Medicine | Admitting: Family Medicine

## 2014-11-26 VITALS — BP 134/90 | HR 87 | Temp 98.5°F | Ht 69.0 in | Wt 175.0 lb

## 2014-11-26 DIAGNOSIS — M940 Chondrocostal junction syndrome [Tietze]: Secondary | ICD-10-CM

## 2014-11-26 MED ORDER — IBUPROFEN 600 MG PO TABS: 600.0000 mg | ORAL_TABLET | Freq: Four times a day (QID) | ORAL | Status: DC | PRN

## 2014-11-26 NOTE — Patient Instructions (Signed)
Thank you for coming to see me today. It was a pleasure. Today we talked about:   Chest pain: this appears related to the muscles of your chest. I am prescribing some ibuprofen which will hopefully help with the pain. If you develop a change in the pain you have, shortness of breath, sweating, please return or go to the emergency department. If you think you are having a heart attack, go straight to the emergency department  Please make an appointment to see Dr. Beryle Flock for follow-up  If you have any questions or concerns, please do not hesitate to call the office at [phone].  Sincerely,  Jacquelin Hawking, MD   Costochondritis Costochondritis, sometimes called Tietze syndrome, is a swelling and irritation (inflammation) of the tissue (cartilage) that connects your ribs with your breastbone (sternum). It causes pain in the chest and rib area. Costochondritis usually goes away on its own over time. It can take up to 6 weeks or longer to get better, especially if you are unable to limit your activities. CAUSES  Some cases of costochondritis have no known cause. Possible causes include:  Injury (trauma).  Exercise or activity such as lifting.  Severe coughing. SIGNS AND SYMPTOMS  Pain and tenderness in the chest and rib area.  Pain that gets worse when coughing or taking deep breaths.  Pain that gets worse with specific movements. DIAGNOSIS  Your health care provider will do a physical exam and ask about your symptoms. Chest X-rays or other tests may be done to rule out other problems. TREATMENT  Costochondritis usually goes away on its own over time. Your health care provider may prescribe medicine to help relieve pain. HOME CARE INSTRUCTIONS   Avoid exhausting physical activity. Try not to strain your ribs during normal activity. This would include any activities using chest, abdominal, and side muscles, especially if heavy weights are used.  Apply ice to the affected area  for the first 2 days after the pain begins.  Put ice in a plastic bag.  Place a towel between your skin and the bag.  Leave the ice on for 20 minutes, 2-3 times a day.  Only take over-the-counter or prescription medicines as directed by your health care provider. SEEK MEDICAL CARE IF:  You have redness or swelling at the rib joints. These are signs of infection.  Your pain does not go away despite rest or medicine. SEEK IMMEDIATE MEDICAL CARE IF:   Your pain increases or you are very uncomfortable.  You have shortness of breath or difficulty breathing.  You cough up blood.  You have worse chest pains, sweating, or vomiting.  You have a fever or persistent symptoms for more than 2-3 days.  You have a fever and your symptoms suddenly get worse. MAKE SURE YOU:   Understand these instructions.  Will watch your condition.  Will get help right away if you are not doing well or get worse. Document Released: 12/13/2004 Document Revised: 12/24/2012 Document Reviewed: 10/07/2012 [facility] Patient Information 2015 [place], [place]. This information is not intended to replace advice given to you by your health care provider. Make sure you discuss any questions you have with your health care provider.

## 2014-11-26 NOTE — Progress Notes (Signed)
    Subjective   Randy Fisher is a 59 y.o. male that presents for a same day visit  1. Left sided chest pain: Symptoms started a few months ago. Symptoms occuring constantly (for the last two months), daily and he is currently having chest pain. Chest pain is present at rest. Movements make the pain worse. He has no associated dyspnea or diaphoresis. Ibuprofen has helped slightly with his pain. He reports no personal or family history of heart disease.   ROS Per HPI  Social History  Substance Use Topics  . Smoking status: Never Smoker   . Smokeless tobacco: None  . Alcohol Use: Yes    No Known Allergies  Objective   BP 134/90 mmHg  Pulse 87  Temp(Src) 98.5 F (36.9 C) (Oral)  Ht  (1.753 m)  Wt 175 lb (79.379 kg)  BMI 25.83 kg/m2  General: Well appearing, no distress Respiratory/Chest: Clear to auscultation bilaterally. Reproducible chest wall pain on palpation Cardiovascular: Regular rate and rhythm, no murmurs Neuro: Alert, oriented  EKG: Normal sinus rhythm  Assessment and Plan   Meds ordered this encounter  Medications  . ibuprofen (ADVIL,MOTRIN) 600 MG tablet    Sig: Take 1 tablet (600 mg total) by mouth every 6 (six) hours as needed.    Dispense:  60 tablet    Refill:  0    Costochondritis  Ibuprofen prn  Red flags for AVS symptoms  Follow-up with PCP regarding chronic back pain

## 2014-12-14 LAB — GLUCOSE, POCT (MANUAL RESULT ENTRY): POC Glucose: 76 mg/dl (ref 70–99)

## 2014-12-28 ENCOUNTER — Other Ambulatory Visit: Payer: Self-pay | Admitting: Family Medicine

## 2014-12-30 ENCOUNTER — Telehealth: Payer: Self-pay | Admitting: Family Medicine

## 2014-12-30 NOTE — Telephone Encounter (Signed)
Sponsor called and wanted to get a referral for the patient to be seen by a cardiologist so that he can get a letter or diagnosed to say he can not work and get disability. jw

## 2014-12-30 NOTE — Telephone Encounter (Signed)
Please call patient at [phone] and also the intern social worker helping him Helyn AppWosen Negussie [phone]. jw

## 2014-12-31 ENCOUNTER — Encounter: Payer: Self-pay | Admitting: Family Medicine

## 2014-12-31 ENCOUNTER — Ambulatory Visit: Payer: Self-pay | Admitting: Family Medicine

## 2014-12-31 DIAGNOSIS — I509 Heart failure, unspecified: Secondary | ICD-10-CM | POA: Insufficient documentation

## 2014-12-31 DIAGNOSIS — M940 Chondrocostal junction syndrome [Tietze]: Secondary | ICD-10-CM | POA: Insufficient documentation

## 2014-12-31 NOTE — Telephone Encounter (Signed)
Spoke to SW.  Need CHF diagnosis added to problem list.  Expressed concern that patient has no showed for 2 appts.  She stated she will make new appt.  Erasmo DownerAngela M Kamir Selover, MD, MPH PGY-2,  [facility] 12/31/2014 5:09 PM

## 2015-01-11 ENCOUNTER — Other Ambulatory Visit: Payer: Self-pay | Admitting: Family Medicine

## 2015-01-19 ENCOUNTER — Ambulatory Visit (INDEPENDENT_AMBULATORY_CARE_PROVIDER_SITE_OTHER): Payer: Medicaid Other | Admitting: Family Medicine

## 2015-01-19 ENCOUNTER — Encounter: Payer: Self-pay | Admitting: Family Medicine

## 2015-01-19 VITALS — BP 122/84 | HR 88 | Temp 97.4°F | Ht 69.0 in | Wt 181.0 lb

## 2015-01-19 DIAGNOSIS — M545 Low back pain, unspecified: Secondary | ICD-10-CM

## 2015-01-19 DIAGNOSIS — M7989 Other specified soft tissue disorders: Secondary | ICD-10-CM | POA: Diagnosis not present

## 2015-01-19 DIAGNOSIS — I5032 Chronic diastolic (congestive) heart failure: Secondary | ICD-10-CM | POA: Diagnosis not present

## 2015-01-19 MED ORDER — NAPROXEN 500 MG PO TABS: 500.0000 mg | ORAL_TABLET | Freq: Two times a day (BID) | ORAL | Status: DC

## 2015-01-19 MED ORDER — CARVEDILOL 3.125 MG PO TABS
3.1250 mg | ORAL_TABLET | Freq: Two times a day (BID) | ORAL | Status: DC
Start: 2015-01-19 — End: 2015-03-22

## 2015-01-19 NOTE — Patient Instructions (Signed)
Basis you again today. We'll get an ultrasound of your arm to figure out what that masses. Please stop taking ibuprofen. He can start taking naproxen twice daily for your back pain. Please also do these exercises 3 times a day to help with your back pain. Start taking Coreg twice daily this is to protect her heart. I like to see you back in 1-2 months to talk about your high blood sugars.  Take care, Dr. B  Back Exercises If you have pain in your back, do these exercises 2-3 times each day or as told by your doctor. When the pain goes away, do the exercises once each day, but repeat the steps more times for each exercise (do more repetitions). If you do not have pain in your back, do these exercises once each day or as told by your doctor. EXERCISES Single Knee to Chest Do these steps 3-5 times in a row for each leg: 1. Lie on your back on a firm bed or the floor with your legs stretched out. 2. Bring one knee to your chest. 3. Hold your knee to your chest by grabbing your knee or thigh. 4. Pull on your knee until you feel a gentle stretch in your lower back. 5. Keep doing the stretch for 10-30 seconds. 6. Slowly let go of your leg and straighten it. Pelvic Tilt Do these steps 5-10 times in a row: 1. Lie on your back on a firm bed or the floor with your legs stretched out. 2. Bend your knees so they point up to the ceiling. Your feet should be flat on the floor. 3. Tighten your lower belly (abdomen) muscles to press your lower back against the floor. This will make your tailbone point up to the ceiling instead of pointing down to your feet or the floor. 4. Stay in this position for 5-10 seconds while you gently tighten your muscles and breathe evenly. Cat-Cow Do these steps until your lower back bends more easily: 1. Get on your hands and knees on a firm surface. Keep your hands under your shoulders, and keep your knees under your hips. You may put padding under your knees. 2. Let your head  hang down, and make your tailbone point down to the floor so your lower back is round like the back of a cat. 3. Stay in this position for 5 seconds. 4. Slowly lift your head and make your tailbone point up to the ceiling so your back hangs low (sags) like the back of a cow. 5. Stay in this position for 5 seconds. Press-Ups Do these steps 5-10 times in a row: 1. Lie on your belly (face-down) on the floor. 2. Place your hands near your head, about shoulder-width apart. 3. While you keep your back relaxed and keep your hips on the floor, slowly straighten your arms to raise the top half of your body and lift your shoulders. Do not use your back muscles. To make yourself more comfortable, you may change where you place your hands. 4. Stay in this position for 5 seconds. 5. Slowly return to lying flat on the floor. Bridges Do these steps 10 times in a row: 1. Lie on your back on a firm surface. 2. Bend your knees so they point up to the ceiling. Your feet should be flat on the floor. 3. Tighten your butt muscles and lift your butt off of the floor until your waist is almost as high as your knees. If you do not  feel the muscles working in your butt and the back of your thighs, slide your feet 1-2 inches farther away from your butt. 4. Stay in this position for 3-5 seconds. 5. Slowly lower your butt to the floor, and let your butt muscles relax. If this exercise is too easy, try doing it with your arms crossed over your chest. Belly Crunches Do these steps 5-10 times in a row: 1. Lie on your back on a firm bed or the floor with your legs stretched out. 2. Bend your knees so they point up to the ceiling. Your feet should be flat on the floor. 3. Cross your arms over your chest. 4. Tip your chin a little bit toward your chest but do not bend your neck. 5. Tighten your belly muscles and slowly raise your chest just enough to lift your shoulder blades a tiny bit off of the floor. 6. Slowly lower your  chest and your head to the floor. Back Lifts Do these steps 5-10 times in a row: 1. Lie on your belly (face-down) with your arms at your sides, and rest your forehead on the floor. 2. Tighten the muscles in your legs and your butt. 3. Slowly lift your chest off of the floor while you keep your hips on the floor. Keep the back of your head in line with the curve in your back. Look at the floor while you do this. 4. Stay in this position for 3-5 seconds. 5. Slowly lower your chest and your face to the floor. GET HELP IF:  Your back pain gets a lot worse when you do an exercise.  Your back pain does not lessen 2 hours after you exercise. If you have any of these problems, stop doing the exercises. Do not do them again unless your doctor says it is okay. GET HELP RIGHT AWAY IF:  You have sudden, very bad back pain. If this happens, stop doing the exercises. Do not do them again unless your doctor says it is okay.   This information is not intended to replace advice given to you by your health care provider. Make sure you discuss any questions you have with your health care provider.   Document Released: 04/07/2010 Document Revised: 11/24/2014 Document Reviewed: 04/29/2014 Elsevier Interactive Patient Education [employer]
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
## 2015-01-20 NOTE — Assessment & Plan Note (Signed)
Unclear what this mass represents Ultrasound pending

## 2015-01-20 NOTE — Assessment & Plan Note (Signed)
Explained to patient and social worker that CHF is mild diastolic dysfunction This is unlikely to qualify patient for SSI Letter given with list of diagnoses, meds, and Echo results Start coreg 3.125mg  BID, Continue Lasix

## 2015-01-20 NOTE — Assessment & Plan Note (Signed)
MSK back pain mostly in paraspinal muscles Back exercises TID D/c ibuprofen and start Naproxen BID

## 2015-01-27 ENCOUNTER — Ambulatory Visit (HOSPITAL_COMMUNITY): Payer: Medicaid Other

## 2015-01-27 ENCOUNTER — Telehealth: Payer: Self-pay | Admitting: Family Medicine

## 2015-01-27 NOTE — Telephone Encounter (Signed)
Pt went to [facility] today and was told that we canceled this. They didn't get the VM we left. They would like us to reschedule the appointment and let them know when it is. Myriam Jacobsonjw

## 2015-02-01 ENCOUNTER — Other Ambulatory Visit: Payer: Self-pay | Admitting: Family Medicine

## 2015-02-01 NOTE — Telephone Encounter (Signed)
Left message on patient voicemail with swahili pacific interpretor [phone], informing of ultrasound on 11/25 at 10:45am[facility].

## 2015-02-11 ENCOUNTER — Ambulatory Visit (HOSPITAL_COMMUNITY): Payer: Medicaid Other

## 2015-02-16 ENCOUNTER — Telehealth: Payer: Self-pay | Admitting: Family Medicine

## 2015-02-16 NOTE — Telephone Encounter (Signed)
Patient is needing another referral to get ultrasound for his arm.  He missed the previous appointment because he didn't get the message. [facility]   Please let her know about the appt.

## 2015-02-25 NOTE — Telephone Encounter (Signed)
Spoke with Gery PrayShantelle, appointment scheduled [facility] 12/16 at 8am [facility].

## 2015-03-02 ENCOUNTER — Encounter: Payer: Self-pay | Admitting: *Deleted

## 2015-03-02 DIAGNOSIS — Z719 Counseling, unspecified: Secondary | ICD-10-CM

## 2015-03-02 DIAGNOSIS — E1169 Type 2 diabetes mellitus with other specified complication: Secondary | ICD-10-CM

## 2015-03-02 DIAGNOSIS — M869 Osteomyelitis, unspecified: Principal | ICD-10-CM

## 2015-03-02 NOTE — Congregational Nurse Program (Signed)
Congregational Nurse Program Note  Date of Encounter: 03/02/2015  Past Medical History: No past medical history on file.  Encounter Details:     CNP Questionnaire - 03/02/15 1853    Patient Demographics   Is this a new or existing patient? Existing   Patient is considered a/an Refugee   Patient Assistance   Patient's financial/insurance status Medicaid   Patient referred to apply for the following financial assistance Not Applicable   Food insecurities addressed Not Applicable   Transportation assistance No   Assistance securing medications Yes   Educational health offerings Chronic disease;Diabetes;Interpersonal relationships;Safety;Health literacy   Encounter Details   Primary purpose of visit Chronic Illness/Condition Visit;Navigating the Healthcare System;Education/Health Concerns;Safety   Was an Emergency Department visit averted? Not Applicable   Does patient have a medical provider? Yes   Patient referred to Not Applicable   Was a mental health screening completed? (GAINS tool) No   Does patient have dental issues? No   Since previous encounter, have you referred patient for abnormal blood pressure that resulted in a new diagnosis or medication change? No   Since previous encounter, have you referred patient for abnormal blood glucose that resulted in a new diagnosis or medication change? No   For Abstraction Use Only   Does patient have insurance? Yes         Amb Nursing Assessment - 03/02/15 1607    Nutrition Screen   Diabetes (p) Yes   CBG done? (p) Yes   CBG resulted in Enter/ Edit results? (p) Yes  CBG 65   Functional Status   Activities of Daily Living (p) Independent   Ambulation (p) Independent   Medication Administration (p) Independent   Home Management (p) Independent   Risk/Barriers  Assessment   Barriers to Care Management & Learning (p) Language   Abuse/Neglect Assessment   Do you feel unsafe in your current relationship? (p) No   Do you feel  physically threatened by others? (p) No   Anyone hurting you at home, work, or school? (p) No   Information provided on [employer] resources (p) Yes   Patient Literacy   How often do you need to have someone help you when you read instructions, pamphlets, or other written materials from your doctor or pharmacy? (p) 4 - Often   Language Assistant   Interpreter Needed? (p) Yes   Interpreter Name (p) Randy Fisher      Patient is a frequent patient at the clinic and he has progressed to coming more . He is attending English class . He is also wanting to get a job which Child psychotherapistsocial worker is working very close with him for  This.Taking his medications and enjoying coming to the facility. Looks great each time he has come BP and CBG within normal range. Still needs encouragement and help with medication through pharmacy    1/10/17[name]  in for rt check of CBG and BP. Has a raised area on his left arm looks like a growth that just appeared and he states that he will have Surgery on the 13 th of January . Client was given bus pass to go to [employer] for job interview also medication was updated and pharmacy called about meds  04/12/15. Client visit today was his appointment  concerning  it to be changed and he has been taking his medications without eating and was nauseated from this. Instructed client  To please eat something  When taking his pain medications and try diet ginger ale  and crackers and then try something  Like soups If these abnormal clinical findings persist, appropriate workup will be completed. The patient understands that follow up is required to  resolve the situation.If not eating or feeling better come back today and something else will be offered to help him. Client does not want to go to MD or ER at this time will follow up with him today .

## 2015-03-04 ENCOUNTER — Ambulatory Visit (HOSPITAL_COMMUNITY)
Admission: RE | Admit: 2015-03-04 | Discharge: 2015-03-04 | Disposition: A | Discharge status: Home / Self Care | Payer: Medicaid Other | Source: Ambulatory Visit | Attending: Family Medicine | Admitting: Family Medicine

## 2015-03-04 DIAGNOSIS — R2232 Localized swelling, mass and lump, left upper limb: Secondary | ICD-10-CM | POA: Insufficient documentation

## 2015-03-04 DIAGNOSIS — M79602 Pain in left arm: Secondary | ICD-10-CM | POA: Insufficient documentation

## 2015-03-04 DIAGNOSIS — M7989 Other specified soft tissue disorders: Secondary | ICD-10-CM

## 2015-03-07 ENCOUNTER — Telehealth: Payer: Self-pay | Admitting: Family Medicine

## 2015-03-07 DIAGNOSIS — M7989 Other specified soft tissue disorders: Secondary | ICD-10-CM

## 2015-03-07 NOTE — Telephone Encounter (Signed)
Please let patient know (with use of phone Swahili interpreter), that we need to get a closer look at the swelling on his arm with an MRI.  Please schedule him for this.  Please also make f/u appt with me in clinic after MRI results to discuss.  Erasmo DownerAngela M Giulian Goldring, MD, MPH PGY-2,  [facility] 03/07/2015 9:20 AM

## 2015-03-07 NOTE — Telephone Encounter (Signed)
Appt scheduled and pt informed via Junie PanningSwahili interpretor Samuel( ID # [id number]).  Also mailed letter. Cecia Egge, Maryjo RochesterJessica Dawn

## 2015-03-11 NOTE — Telephone Encounter (Signed)
Medicaid did not approve MRI. Instead will refer to general surgery for second opinion about whether MRI is needed or if any further treatment is needed.  Please let patient know with use of phone Swahili interpreter that we are canceling our appointment with him here an MRI appointment and instead scheduling him with surgeon.  Erasmo DownerAngela M Bacigalupo, MD, MPH PGY-2,   Family [facility] 03/11/2015 10:12 AM

## 2015-03-11 NOTE — Addendum Note (Signed)
Addended by: Erasmo DownerBACIGALUPO, Copper Basnett M on: 03/11/2015 10:13 AM   Modules accepted: Orders

## 2015-03-15 ENCOUNTER — Ambulatory Visit (HOSPITAL_COMMUNITY): Payer: Medicaid Other

## 2015-03-17 ENCOUNTER — Other Ambulatory Visit: Payer: Self-pay | Admitting: Family Medicine

## 2015-03-17 DIAGNOSIS — M7989 Other specified soft tissue disorders: Secondary | ICD-10-CM

## 2015-03-22 ENCOUNTER — Encounter: Payer: Self-pay | Admitting: Family Medicine

## 2015-03-22 ENCOUNTER — Ambulatory Visit (INDEPENDENT_AMBULATORY_CARE_PROVIDER_SITE_OTHER): Payer: Medicaid Other | Admitting: Family Medicine

## 2015-03-22 VITALS — BP 139/76 | HR 71 | Temp 98.4°F | Wt 183.0 lb

## 2015-03-22 DIAGNOSIS — M799 Soft tissue disorder, unspecified: Secondary | ICD-10-CM | POA: Diagnosis present

## 2015-03-22 DIAGNOSIS — Z789 Other specified health status: Secondary | ICD-10-CM

## 2015-03-22 DIAGNOSIS — K5900 Constipation, unspecified: Secondary | ICD-10-CM | POA: Diagnosis not present

## 2015-03-22 DIAGNOSIS — I5032 Chronic diastolic (congestive) heart failure: Secondary | ICD-10-CM | POA: Diagnosis not present

## 2015-03-22 DIAGNOSIS — M7989 Other specified soft tissue disorders: Secondary | ICD-10-CM

## 2015-03-22 MED ORDER — CARVEDILOL 3.125 MG PO TABS: 3.1250 mg | ORAL_TABLET | Freq: Two times a day (BID) | ORAL | Status: DC

## 2015-03-22 MED ORDER — METFORMIN HCL 500 MG PO TABS: ORAL_TABLET | ORAL | Status: DC

## 2015-03-22 MED ORDER — NAPROXEN 500 MG PO TABS: 500.0000 mg | ORAL_TABLET | Freq: Two times a day (BID) | ORAL | Status: DC

## 2015-03-22 MED ORDER — POLYETHYLENE GLYCOL 3350 17 GM/SCOOP PO POWD: 17.0000 g | Freq: Every day | ORAL | Status: DC

## 2015-03-22 MED ORDER — GABAPENTIN 100 MG PO CAPS: 100.0000 mg | ORAL_CAPSULE | Freq: Three times a day (TID) | ORAL | Status: DC

## 2015-03-22 MED ORDER — FUROSEMIDE 20 MG PO TABS: 20.0000 mg | ORAL_TABLET | Freq: Every day | ORAL | Status: DC

## 2015-03-22 NOTE — Patient Instructions (Signed)

## 2015-03-22 NOTE — Progress Notes (Signed)
   Subjective:   Randy Fisher is a 60 y.o. male with a history of  LUE swelling  here for f/u of mass and discussion of ultrasound results.  LUE soft tissue mass - hasnt grown since last visit - Continues to have pain in upper arm mass that makes it difficult to sleep - Was taking naproxen for pain, but has run out of this  Continues to have a lot of confusion around medications  Constipation - Taking a stool softener multiple times per day - Only has 1 hard stool every 2-3 days - Has been a longstanding issue for him  Review of Systems:  Per HPI. All other systems reviewed and are negative.   PMH, PSH, Medications, Allergies, and FmHx reviewed and updated in EMR.  Social History: never smoker  Objective:  BP 139/76 mmHg  Pulse 71  Temp(Src) 98.4 F (36.9 C) (Oral)  Wt 183 lb (83.008 kg)  Gen:  60 y.o. male in NAD HEENT: NCAT, MMM, EOMI, PERRL, anicteric sclerae CV: RRR, no MRG Resp: Non-labored, CTAB, no wheezes noted Abd: Soft, NTND, +BS Ext: WWP, large soft, tender, mobile mass over lateral LUE MSK: Full ROM, strength intact Neuro: Alert and oriented, speech normal    Assessment & Plan:     Randy Fisher is a 60 y.o. 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

## 2015-03-23 NOTE — Assessment & Plan Note (Signed)
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

## 2015-03-23 NOTE — Assessment & Plan Note (Signed)
Continue stool softener daily Start daily miralax and titrate to one soft BM daily

## 2015-03-29 LAB — GLUCOSE, POCT (MANUAL RESULT ENTRY): POC GLUCOSE: 120 mg/dL — AB (ref 70–99)

## 2015-05-10 ENCOUNTER — Other Ambulatory Visit: Payer: Self-pay | Admitting: Family Medicine

## 2015-05-11 ENCOUNTER — Encounter: Payer: Self-pay | Admitting: *Deleted

## 2015-05-11 DIAGNOSIS — Z139 Encounter for screening, unspecified: Secondary | ICD-10-CM

## 2015-05-11 NOTE — Congregational Nurse Program (Signed)
Congregational Nurse Program Note  Date of Encounter: 05/11/2015  Past Medical History: Past Medical History  Diagnosis Date  . Alcohol abuse     Encounter Details:    05/10/15, made visit at clients home after surgery.Left arm , dressing clean ,dry and intact . Some swelling so reinforced that he keep his arm elevated on pillows.Also called pharmacy and had his medications refilled.

## 2015-05-17 ENCOUNTER — Encounter: Payer: Self-pay | Admitting: *Deleted

## 2015-05-17 DIAGNOSIS — Z139 Encounter for screening, unspecified: Secondary | ICD-10-CM

## 2015-05-17 LAB — GLUCOSE, POCT (MANUAL RESULT ENTRY): POC GLUCOSE: 325 mg/dL — AB (ref 70–99)

## 2015-05-17 NOTE — Congregational Nurse Program (Signed)
Congregational Nurse Program Note  Date of Encounter: 05/17/2015  Past Medical History: No past medical history on file.  Encounter Details:     CNP Questionnaire - 05/17/15 1227    Patient Demographics   Is this a new or existing patient? Existing   Patient is considered a/an Refugee   Race African   Patient Assistance   Location of Patient Assistance [place]   Patient's financial/insurance status Medicare   Uninsured Patient No   Patient referred to apply for the following financial assistance Not Applicable   Food insecurities addressed Not Applicable   Transportation assistance No   Assistance securing medications No   Educational health offerings Other   Encounter Details   Primary purpose of visit Other   Was an Emergency Department visit averted? Not Applicable   Does patient have a medical provider? Yes   Patient referred to Not Applicable   Was a mental health screening completed? (GAINS tool) No   Does patient have dental issues? No   Does patient have vision issues? No   Since previous encounter, have you referred patient for abnormal blood pressure that resulted in a new diagnosis or medication change? No   Since previous encounter, have you referred patient for abnormal blood glucose that resulted in a new diagnosis or medication change? No       Client in for continued follow up care of arm from previous surgery  And his dressing left arm clean dry and intact without any odor His blood glucose was up and he was asked about his diet and medicines.Will continue  to follow

## 2015-05-18 HISTORY — PX: TUMOR REMOVAL: SHX12

## 2015-05-25 ENCOUNTER — Other Ambulatory Visit: Payer: Self-pay | Admitting: *Deleted

## 2015-05-25 DIAGNOSIS — I5032 Chronic diastolic (congestive) heart failure: Secondary | ICD-10-CM

## 2015-05-25 MED ORDER — CARVEDILOL 3.125 MG PO TABS: 3.1250 mg | ORAL_TABLET | Freq: Two times a day (BID) | ORAL | Status: DC

## 2015-05-25 MED ORDER — METFORMIN HCL 500 MG PO TABS: ORAL_TABLET | ORAL | Status: DC

## 2015-05-25 MED ORDER — NAPROXEN 500 MG PO TABS: 500.0000 mg | ORAL_TABLET | Freq: Two times a day (BID) | ORAL | Status: DC

## 2015-06-03 ENCOUNTER — Ambulatory Visit (INDEPENDENT_AMBULATORY_CARE_PROVIDER_SITE_OTHER): Payer: Medicaid Other | Admitting: Family Medicine

## 2015-06-03 VITALS — BP 138/92 | HR 70 | Temp 98.4°F | Wt 173.4 lb

## 2015-06-03 DIAGNOSIS — K5900 Constipation, unspecified: Secondary | ICD-10-CM

## 2015-06-03 DIAGNOSIS — R631 Polydipsia: Secondary | ICD-10-CM

## 2015-06-03 DIAGNOSIS — R109 Unspecified abdominal pain: Secondary | ICD-10-CM | POA: Diagnosis not present

## 2015-06-03 DIAGNOSIS — R682 Dry mouth, unspecified: Secondary | ICD-10-CM

## 2015-06-03 DIAGNOSIS — R358 Other polyuria: Secondary | ICD-10-CM

## 2015-06-03 DIAGNOSIS — E119 Type 2 diabetes mellitus without complications: Secondary | ICD-10-CM | POA: Diagnosis not present

## 2015-06-03 DIAGNOSIS — R3589 Other polyuria: Secondary | ICD-10-CM

## 2015-06-03 LAB — CBC
HCT: 46 % (ref 39.0–52.0)
Hemoglobin: 15 g/dL (ref 13.0–17.0)
MCH: 27.2 pg (ref 26.0–34.0)
MCHC: 32.6 g/dL (ref 30.0–36.0)
MCV: 83.3 fL (ref 78.0–100.0)
MPV: 12.7 fL — ABNORMAL HIGH (ref 8.6–12.4)
PLATELETS: 209 10*3/uL (ref 150–400)
RBC: 5.52 MIL/uL (ref 4.22–5.81)
RDW: 13.4 % (ref 11.5–15.5)
WBC: 6.2 10*3/uL (ref 4.0–10.5)

## 2015-06-03 LAB — POCT GLYCOSYLATED HEMOGLOBIN (HGB A1C): HEMOGLOBIN A1C: 13

## 2015-06-03 MED ORDER — POLYETHYLENE GLYCOL 3350 17 GM/SCOOP PO POWD: 17.0000 g | Freq: Two times a day (BID) | ORAL | Status: DC

## 2015-06-03 MED ORDER — SENNOSIDES-DOCUSATE SODIUM 8.6-50 MG PO TABS: 1.0000 | ORAL_TABLET | Freq: Every evening | ORAL | Status: DC | PRN

## 2015-06-03 MED ORDER — METFORMIN HCL 1000 MG PO TABS: ORAL_TABLET | ORAL | Status: DC

## 2015-06-03 NOTE — Assessment & Plan Note (Addendum)
Likely worsened by recent narcotic usage after lipoma removal Suspect abd pain is related to constipation - benign abd exam Increase miralax to 1 capful BID Start docusate-S qhs prn F/u prn Referral to GI for screening colonoscopy and any further management

## 2015-06-03 NOTE — Assessment & Plan Note (Signed)
Suspect polyuria and polydipsia related to poorly controlled diabetes Last A1c 7.3 Only taking metformin 500mg  daily - increase to 1000mg  BID A1c today Screening CMET and lipids as well

## 2015-06-03 NOTE — Progress Notes (Signed)
   Subjective:   Randy Fisher is a 60 y.o. male with a history of HFpEF, T2DM, constipation here for constipation f/u, abd pain, dry mouth  Constipation:  - patient taking Miralax 1/2 capful daily x1 month - doesn't seem to be helping - reports that constipation is the same as previous - needs colonoscopy - having BM q2-3 days and it is difficult to pass - denies any melena - having small amount of BRBPR at end of difficult BM  Abd pain - eating smaller meals because he feels full and constipated and will go 2-3 days without BM - having cramping and heavy pains in lower abd intermittently after 2 days without BM - denies N/V/D, fevers  Dry mouth - reports that he drinks plenty of water - which helps it feel better - started 1 month ago - thinks it was associated with surgery for lipoma removal - polydipsia (drinking 16oz bottles of water daily) and polyuria with nocturia  T2DM - Checking BG at home: no - Medications: metformin 500mg  BID - Compliance: thinks he is only taking once daily, though does not know the names of his medications - Diet: eats a lot of rice - Exercise: no formal exercise   Review of Systems:  Per HPI.   Social History: never smoker  Objective:  BP 138/92 mmHg  Pulse 70  Temp(Src) 98.4 F (36.9 C)  Wt 173 lb 6.4 oz (78.654 kg)  Gen:  60 y.o. male in NAD HEENT: NCAT, MMM, EOMI, PERRL, anicteric sclerae CV: RRR, no MRG Resp: Non-labored, CTAB, no wheezes noted Abd: Soft, NTND, BS present, no guarding or organomegaly Ext: WWP, no edema MSK: No obvious deformities Neuro: Alert and oriented, speech normal      Chemistry      Component Value Date/Time   NA 142 10/11/2014 1614   K 4.1 10/11/2014 1614   CL 109 10/11/2014 1614   CO2 24 10/11/2014 1614   BUN 14 10/11/2014 1614   CREATININE 0.81 10/11/2014 1614   CREATININE 0.87 08/26/2014 0545      Component Value Date/Time   CALCIUM 9.3 10/11/2014 1614   ALKPHOS 60 08/25/2014 1856   AST 19  08/25/2014 1856   ALT 33 08/25/2014 1856   BILITOT 0.4 08/25/2014 1856      Lab Results  Component Value Date   WBC 8.8 08/26/2014   HGB 12.5* 08/26/2014   HCT 38.1* 08/26/2014   MCV 82.1 08/26/2014   PLT 194 08/26/2014   Lab Results  Component Value Date   TSH 1.600 08/26/2014   Lab Results  Component Value Date   HGBA1C 7.3* 08/26/2014   Assessment & Plan:     Randy Omanorozi Meharg is a 60 y.o. male here for  Constipation Likely worsened by recent narcotic usage after lipoma removal Suspect abd pain is related to constipation - benign abd exam Increase miralax to 1 capful BID Start docusate-S qhs prn F/u prn Referral to GI for screening colonoscopy and any further management  T2DM (type 2 diabetes mellitus) (HCC) Suspect polyuria and polydipsia related to poorly controlled diabetes Last A1c 7.3 Only taking metformin 500mg  daily - increase to 1000mg  BID A1c today Screening CMET and lipids as well       Erasmo DownerAngela M Elvin Banker, MD MPH PGY-2,  [facility] 06/03/2015  4:50 PM

## 2015-06-04 LAB — COMPLETE METABOLIC PANEL WITH GFR
ALBUMIN: 4.2 g/dL (ref 3.6–5.1)
ALT: 16 U/L (ref 9–46)
AST: 13 U/L (ref 10–35)
Alkaline Phosphatase: 78 U/L (ref 40–115)
BILIRUBIN TOTAL: 0.6 mg/dL (ref 0.2–1.2)
BUN: 15 mg/dL (ref 7–25)
CO2: 25 mmol/L (ref 20–31)
Calcium: 9.4 mg/dL (ref 8.6–10.3)
Chloride: 102 mmol/L (ref 98–110)
Creat: 0.86 mg/dL (ref 0.70–1.25)
GFR, Est African American: >89 mL/min (ref >=60)
GFR, Est Non African American: >89 mL/min (ref >=60)
GLUCOSE: 420 mg/dL — AB (ref 65–99)
Potassium: 4.5 mmol/L (ref 3.5–5.3)
SODIUM: 137 mmol/L (ref 135–146)
TOTAL PROTEIN: 7.4 g/dL (ref 6.1–8.1)

## 2015-06-04 LAB — LIPID PANEL
Cholesterol: 169 mg/dL (ref 125–200)
HDL: 29 mg/dL — AB (ref >=40)
LDL Cholesterol: 106 mg/dL (ref <130)
Total CHOL/HDL Ratio: 5.8 Ratio — ABNORMAL HIGH (ref <=5.0)
Triglycerides: 171 mg/dL — ABNORMAL HIGH (ref <150)
VLDL: 34 mg/dL — ABNORMAL HIGH (ref <30)

## 2015-06-05 ENCOUNTER — Telehealth (HOSPITAL_BASED_OUTPATIENT_CLINIC_OR_DEPARTMENT_OTHER): Payer: Self-pay | Admitting: Emergency Medicine

## 2015-06-21 ENCOUNTER — Encounter: Payer: Self-pay | Admitting: *Deleted

## 2015-06-21 ENCOUNTER — Encounter (HOSPITAL_COMMUNITY): Payer: Self-pay | Admitting: *Deleted

## 2015-06-21 ENCOUNTER — Encounter (HOSPITAL_COMMUNITY): Payer: Self-pay

## 2015-06-21 ENCOUNTER — Emergency Department (HOSPITAL_COMMUNITY): Payer: Medicaid Other

## 2015-06-21 ENCOUNTER — Inpatient Hospital Stay (HOSPITAL_COMMUNITY)
Admission: EM | Admit: 2015-06-21 | Discharge: 2015-06-24 | DRG: 638 | Disposition: A | Discharge status: Home / Self Care | Payer: Medicaid Other | Attending: Family Medicine | Admitting: Family Medicine

## 2015-06-21 ENCOUNTER — Ambulatory Visit (INDEPENDENT_AMBULATORY_CARE_PROVIDER_SITE_OTHER)
Admission: EM | Admit: 2015-06-21 | Discharge: 2015-06-21 | Disposition: A | Discharge status: Other Acute Inpatient Hospital | Payer: Medicaid Other | Source: Home / Self Care | Attending: Family Medicine | Admitting: Family Medicine

## 2015-06-21 DIAGNOSIS — K3184 Gastroparesis: Secondary | ICD-10-CM | POA: Diagnosis present

## 2015-06-21 DIAGNOSIS — E1310 Other specified diabetes mellitus with ketoacidosis without coma: Secondary | ICD-10-CM

## 2015-06-21 DIAGNOSIS — I2510 Atherosclerotic heart disease of native coronary artery without angina pectoris: Secondary | ICD-10-CM | POA: Diagnosis present

## 2015-06-21 DIAGNOSIS — E870 Hyperosmolality and hypernatremia: Secondary | ICD-10-CM | POA: Diagnosis present

## 2015-06-21 DIAGNOSIS — N179 Acute kidney failure, unspecified: Secondary | ICD-10-CM | POA: Diagnosis present

## 2015-06-21 DIAGNOSIS — R109 Unspecified abdominal pain: Secondary | ICD-10-CM | POA: Diagnosis present

## 2015-06-21 DIAGNOSIS — E119 Type 2 diabetes mellitus without complications: Secondary | ICD-10-CM

## 2015-06-21 DIAGNOSIS — R1032 Left lower quadrant pain: Secondary | ICD-10-CM | POA: Diagnosis not present

## 2015-06-21 DIAGNOSIS — E876 Hypokalemia: Secondary | ICD-10-CM | POA: Diagnosis present

## 2015-06-21 DIAGNOSIS — E785 Hyperlipidemia, unspecified: Secondary | ICD-10-CM | POA: Diagnosis present

## 2015-06-21 DIAGNOSIS — E861 Hypovolemia: Secondary | ICD-10-CM | POA: Diagnosis present

## 2015-06-21 DIAGNOSIS — Z139 Encounter for screening, unspecified: Secondary | ICD-10-CM

## 2015-06-21 DIAGNOSIS — I5030 Unspecified diastolic (congestive) heart failure: Secondary | ICD-10-CM | POA: Diagnosis present

## 2015-06-21 DIAGNOSIS — K5900 Constipation, unspecified: Secondary | ICD-10-CM | POA: Diagnosis present

## 2015-06-21 DIAGNOSIS — I10 Essential (primary) hypertension: Secondary | ICD-10-CM

## 2015-06-21 DIAGNOSIS — E1110 Type 2 diabetes mellitus with ketoacidosis without coma: Secondary | ICD-10-CM | POA: Diagnosis present

## 2015-06-21 DIAGNOSIS — Z7984 Long term (current) use of oral hypoglycemic drugs: Secondary | ICD-10-CM

## 2015-06-21 DIAGNOSIS — I509 Heart failure, unspecified: Secondary | ICD-10-CM

## 2015-06-21 DIAGNOSIS — E860 Dehydration: Secondary | ICD-10-CM | POA: Diagnosis present

## 2015-06-21 DIAGNOSIS — E1143 Type 2 diabetes mellitus with diabetic autonomic (poly)neuropathy: Secondary | ICD-10-CM | POA: Diagnosis present

## 2015-06-21 DIAGNOSIS — R1011 Right upper quadrant pain: Secondary | ICD-10-CM | POA: Diagnosis not present

## 2015-06-21 DIAGNOSIS — I5032 Chronic diastolic (congestive) heart failure: Secondary | ICD-10-CM | POA: Diagnosis not present

## 2015-06-21 HISTORY — DX: Type 2 diabetes mellitus without complications: E11.9

## 2015-06-21 LAB — URINALYSIS, ROUTINE W REFLEX MICROSCOPIC
Glucose, UA: >1000 mg/dL — AB
HGB URINE DIPSTICK: NEGATIVE
Ketones, ur: >80 mg/dL — AB
LEUKOCYTES UA: NEGATIVE
Nitrite: NEGATIVE
Protein, ur: NEGATIVE mg/dL
SPECIFIC GRAVITY, URINE: 1.025 (ref 1.005–1.030)
pH: 5 (ref 5.0–8.0)

## 2015-06-21 LAB — COMPREHENSIVE METABOLIC PANEL
ALK PHOS: 72 U/L (ref 38–126)
ALT: 13 U/L — ABNORMAL LOW (ref 17–63)
ANION GAP: 26 — AB (ref 5–15)
AST: 11 U/L — ABNORMAL LOW (ref 15–41)
Albumin: 3.5 g/dL (ref 3.5–5.0)
BUN: 15 mg/dL (ref 6–20)
CALCIUM: 9.8 mg/dL (ref 8.9–10.3)
CHLORIDE: 105 mmol/L (ref 101–111)
CO2: 7 mmol/L — AB (ref 22–32)
Creatinine, Ser: 1.67 mg/dL — ABNORMAL HIGH (ref 0.61–1.24)
GFR calc non Af Amer: 42 mL/min — ABNORMAL LOW (ref >60)
GFR, EST AFRICAN AMERICAN: 49 mL/min — AB (ref >60)
Glucose, Bld: 622 mg/dL (ref 65–99)
Potassium: 4.1 mmol/L (ref 3.5–5.1)
SODIUM: 138 mmol/L (ref 135–145)
Total Bilirubin: 1.7 mg/dL — ABNORMAL HIGH (ref 0.3–1.2)
Total Protein: 8.2 g/dL — ABNORMAL HIGH (ref 6.5–8.1)

## 2015-06-21 LAB — CBC
HCT: 44.7 % (ref 39.0–52.0)
Hemoglobin: 15.7 g/dL (ref 13.0–17.0)
MCH: 27.5 pg (ref 26.0–34.0)
MCHC: 35.1 g/dL (ref 30.0–36.0)
MCV: 78.3 fL (ref 78.0–100.0)
Platelets: 226 10*3/uL (ref 150–400)
RBC: 5.71 MIL/uL (ref 4.22–5.81)
RDW: 13.5 % (ref 11.5–15.5)
WBC: 7.4 10*3/uL (ref 4.0–10.5)

## 2015-06-21 LAB — CBG MONITORING, ED: GLUCOSE-CAPILLARY: 504 mg/dL — AB (ref 65–99)

## 2015-06-21 LAB — I-STAT VENOUS BLOOD GAS, ED
ACID-BASE DEFICIT: 17 mmol/L — AB (ref 0.0–2.0)
BICARBONATE: 9.1 meq/L — AB (ref 20.0–24.0)
O2 Saturation: 70 %
PCO2 VEN: 23.3 mmHg — AB (ref 45.0–50.0)
PH VEN: 7.199 — AB (ref 7.250–7.300)
PO2 VEN: 43 mmHg (ref 31.0–45.0)
TCO2: 10 mmol/L (ref 0–100)

## 2015-06-21 LAB — URINE MICROSCOPIC-ADD ON

## 2015-06-21 LAB — LIPASE, BLOOD: LIPASE: 47 U/L (ref 11–51)

## 2015-06-21 LAB — I-STAT TROPONIN, ED: Troponin i, poc: 0 ng/mL (ref 0.00–0.08)

## 2015-06-21 MED ORDER — SODIUM CHLORIDE 0.9 % IV BOLUS (SEPSIS)
1000.0000 mL | Freq: Once | INTRAVENOUS | Status: AC
  Administered 2015-06-21: 1000 mL via INTRAVENOUS

## 2015-06-21 MED ORDER — SODIUM CHLORIDE 0.9 % IV SOLN
INTRAVENOUS | Status: DC
  Administered 2015-06-21: via INTRAVENOUS

## 2015-06-21 MED ORDER — SODIUM CHLORIDE 0.9 % IV SOLN
INTRAVENOUS | Status: DC
  Administered 2015-06-21: 4.4 [IU]/h via INTRAVENOUS
  Filled 2015-06-21: qty 2.5

## 2015-06-21 MED ORDER — DEXTROSE-NACL 5-0.45 % IV SOLN
INTRAVENOUS | Status: DC
  Administered 2015-06-22: 04:00:00 via INTRAVENOUS

## 2015-06-21 NOTE — ED Notes (Addendum)
Pt   Reports   abd  Pain   Weak          Decreased  Appetite             Pt  Reports           He   Has     Been taking  His  Medication          Decreased   Appetite         Pt  Has been  Thirsty   /  Constipated          And  Reports       Pt  Brought  To urgent  Care  By  Congregational  Nurse

## 2015-06-21 NOTE — H&P (Signed)
Family Medicine Teaching [facility] Admission History and Physical Service Pager: [phone]  Patient name: Randy Fisher Medical record number: [id number] Date of birth: [birth year] Age: 60 y.o. Gender: male  Primary Care Provider: Shirlee Latch, MD Consultants: none Code Status: FULL (per conversation with patient)  Chief Complaint: abdominal pain  Assessment and Plan: Randy Fisher is a 60 y.o. male presenting with abdominal pain and found to be in DKA in ED. PMH is significant for Type II DM, grade 1 diastolic dysfunction, history of EtOH abuse, and chronic constipation.   Diabetic ketoacidosis: Likely 2/2 reported illness and medication non-compliance over past 5 days. Could be cause of abdominal pain. CBG >600 on presentation to ED. Anion gap 26, pH 7.19, bicarb 9.1. i-stat troponin 0.0, EKG NSR. Na and K WNL. Last A1C 13 on 06/03/15. On metformin  BID at home. Appears very dehydrated on exam. CBG improving with insulin administration (down to 504).  - Admit to SDU, attending [place] - Continue insulin drip - Transition to subQ insulin when indicated - Continue IVF per glucomander protocol - Additional 1L bolus given pronounced dehydration. Will continue IVF rehydration as needed.  - BMP, CBG q4  AKI: Cr 1.67 on admission up from baseline ~0.8. Likely 2/2 to dehydration.  - Continue IVF as above - Continue to monitor  - consider bladder scan to assess for urinary retention  Constipation: May be cause or at least contributing factor to abdominal pain. Has not taken home Miralax for five days prior to admission. Reports last BM two weeks prior to admission. Could also be contributing to decreased appetite and is likely cause of abdominal distention. Diabetic gastroparesis is on differential, though from prior notes it appears that constipation has been an issue for this patient prior to DM diagnosis.  - Miralax two packets BID - Consider KUB for further evaluation if no  improvement with Miralax  Grade 1 diastolic dysfunction: Last echo 06/16 with EF 65-70%. Taking Coreg 3.125mg  BID and Lasix at home. No signs of fluid overload on exam and denies symptoms suggestive of CHF exacerbation.  - Continue Coreg - Hold Lasix at this time given hypovolemia - Given lack of symptoms and EF >40%, consider switching BP medication to HCTZ, CCB or ACE-I pending Cr improvement  History of EtOH abuse: Reports last drink 03/2014. Preexisting liver damage could be cause of abdominal pain, though this is lower on differential.  - Given last drink over one year ago, will not start CIWA protocol at this time - Monitor for signs of withdrawal   FEN/GI: NPO while on glucomander, Miralax Prophylaxis: Lovenox  Disposition: admit to SDU  History of Present Illness:  Randy Fisher is a 60 y.o. male presenting with abdominal pain.  History obtained via Swahili interpretation by patient's family member. Patient is poor historian.   Patient reports that for the past five days he has not felt well. He endorses abdominal pain, especially midsternal and LUQ, as well as decreased appetite. He also reports dry mouth. When he began feeling sick, he stopped taking all of his medications, including metformin. Since stopping medications, his symptoms have worsened. He has not eaten anything in five days, and is drinking minimal water. He denies fevers, chills, nausea. Does endorse vomiting after eating on a couple occasions.   Patient also reports constipation, with last BM two weeks ago. He has a history of chronic constipation for which he takes Miralax, however he has not taken this in the past five days. He reports abdominal  distention.   Of note, patient is a refugee from [place] who moved to [place] in 03/2014. He speaks Swahili.   Review Of Systems: Per HPI with the following additions: denies polyuria, polydipsia, HA Otherwise the remainder of the systems were negative.  Patient Active  Problem List   Diagnosis Date Noted  . DKA (diabetic ketoacidoses) (HCC) 06/21/2015  . Constipation 03/22/2015  . Soft tissue mass 01/19/2015  . CHF (congestive heart failure) (HCC) 12/31/2014  . Refugee health examination 10/11/2014  . Language barrier 10/11/2014  . Back pain 08/26/2014  . T2DM (type 2 diabetes mellitus) (HCC) 08/26/2014  . History of alcohol abuse 08/26/2014    Past Medical History: Past Medical History  Diagnosis Date  . Alcohol abuse   . Diabetes mellitus without complication [facility](HCC)     Past Surgical History: Past Surgical History  Procedure Laterality Date  . 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 Smoking status: Never Smoker   . Smokeless tobacco: None  . Alcohol Use: Yes   Additional social history: reports remote history of tobacco use. Does have history of alcohol abuse when living in [place], but denies alcohol use since coming to [place] over one year ago. Denies marijuana or other illicit drug use.  Is refugee from [place]. [place]oved to [place] in 03/2014.   Please also refer to relevant sections of EMR.  Family History: History reviewed. No pertinent family history. Denies significant family medical history  Allergies and Medications: No Known Allergies No current facility-administered medications on file prior to encounter.   Current Outpatient Prescriptions on File Prior to Encounter  Medication Sig Dispense Refill  . carvedilol (COREG) 3.125 MG tablet Take 1 tablet (3.125 mg total) by mouth 2 (two) times daily with a meal. 60 tablet 3  . furosemide (LASIX) 20 MG tablet TAKE 1 TABLET BY MOUTH DAILY 30 tablet 5  . gabapentin (NEURONTIN) 100 MG capsule TAKE ONE CAPSULE BY MOUTH THREE TIMES DAILY 90 capsule 5  . lactobacillus acidophilus & bulgar (LACTINEX) chewable tablet Chew 1 tablet by mouth 3 (three) times daily with meals.    . metFORMIN (GLUCOPHAGE) 1000 MG tablet TAKE 1 TABLET BY MOUTH  TWICE DAILY WITH A MEAL 60 tablet 3  . naproxen (NAPROSYN) 500 MG tablet Take 1 tablet (500 mg total) by mouth 2 (two) times daily with a meal. 60 tablet 3  . oxyCODONE-acetaminophen (PERCOCET/ROXICET) 5-325 MG tablet Take 1 tablet by mouth every 4 (four) hours as needed for moderate pain or severe pain.    . polyethylene glycol powder (GLYCOLAX/MIRALAX) powder Take 17 g by mouth 2 (two) times daily. 850 g 6  . senna-docusate (SENOKOT-S) 8.6-50 MG tablet Take 1 tablet by mouth at bedtime as needed for mild constipation. 30 tablet 2    Objective: BP 156/99 mmHg  Pulse 89  Resp 15  Wt 152 lb 1.6 oz (68.992 kg)  SpO2 100% Exam: General: lying in bed in NAD, pleasant Eyes: PERRLA, EOMI, no scleral icterus, arcus senilis present bilaterally ENTM: dry mucous membranes, cracked lips, poor dentition Neck: supple, no lymphadenopathy Cardiovascular: RRR, no murmurs appreciated Respiratory: CTAB, no wheezes or rhonchi noted, normal work of breathing on RA Abdomen: TTP in LUQ, mild distention, hypoactive BS MSK: moving all extremities spontaneously, no LE edema Skin: no rashes or bruised noted; very dry skin on LE bilaterally and feet but no cracking or ulcerations noted Neuro: A&Ox3, no focal  deficits Psych: appropriate mood and affect  Labs and Imaging: CBC BMET   Recent Labs Lab 06/21/15 2034  WBC 7.4  HGB 15.7  HCT 44.7  PLT 226    Recent Labs Lab 06/21/15 2034  NA 138  K 4.1  CL 105  CO2 7*  BUN 15  CREATININE 1.67*  GLUCOSE 622*  CALCIUM 9.8     Dg Chest Portable 1 View 06/21/2015  CLINICAL DATA:  Abdominal pain and weakness.  Unknown chronicity EXAM: PORTABLE CHEST 1 VIEW COMPARISON:  None. FINDINGS: The lungs are clear. There is no large effusion. Moderate aortic tortuosity. Pulmonary vasculature is normal. IMPRESSION: Tortuous aorta.  No acute cardiopulmonary findings.    Marquette Saa, MD 06/22/2015, 12:06 AM PGY-1, [facility] FPTS Intern  pager: [phone], text pages welcome  I have read and agree with the amended note as above. Wenda Low, MD, PGY-3 12:24 AM

## 2015-06-21 NOTE — Congregational Nurse Program (Signed)
Congregational Nurse Program Note  Date of Encounter: 06/21/2015  Past Medical History: Past Medical History  Diagnosis Date  . Alcohol abuse   . Diabetes mellitus without complication [facility](HCC)     Encounter Details:     CNP Questionnaire - 06/21/15 2210    Patient Demographics   Is this a new or existing patient? New   Patient is considered a/an Refugee   Race African   Patient Assistance   Location of Patient Assistance [place]   Patient's financial/insurance status Medicare;Medicaid   Uninsured Patient No   Patient referred to apply for the following financial assistance Not Applicable   Food insecurities addressed Not Applicable   Transportation assistance Yes   Type of Assistance Volunteer   Assistance securing medications No   Educational health offerings Navigating the healthcare system   Encounter Details   Primary purpose of visit Acute Illness/Condition Visit   Was an Emergency Department visit averted? No   Does patient have a medical provider? Yes   Patient referred to Emergency Department   Was a mental health screening completed? (GAINS tool) No   Does patient have dental issues? No   Does patient have vision issues? No   Since previous encounter, have you referred patient for abnormal blood pressure that resulted in a new diagnosis or medication change? No   Since previous encounter, have you referred patient for abnormal blood glucose that resulted in a new diagnosis or medication change? No         Amb Nursing Assessment - 06/03/15 1642    Language Assistant   Interpreter Needed? Yes   Consulting civil engineernterpreter Agency [employer]   Interpreter Name RivesSamira   Interpreter [place] [phone]      .  Made a visit to [name] today. Client was found upstairs lying on his bed. When asked how he was doing he started pointing and rubbing his  Abdomen and stating he had not eaten in 2 weeks. He also stated that he  Had not taken his medication  And this was for about  four days. [facility] Randy Fisher  and i followed along and from there he was sent to the ED. We waited from 1730 unto 2130.(Randy Fisher and I) He was seen by ED NP and found to be Hypertensive and CBG greater than 600.He started to c/o chest pain and plans were to do test and admit him. We left him in their care and left phone where to reach us in case of any other emergency.We could not locate his nephew . Randy Fisher and her husband will follow up. I will visit on tomorrow [address] Fisher [phone].

## 2015-06-21 NOTE — ED Provider Notes (Signed)
CSN: [id number]     Arrival date & time 06/21/15  1713 History   First MD Initiated Contact with Patient 06/21/15 1833     Chief Complaint  Patient presents with  . Abdominal Pain   (Consider location/radiation/quality/duration/timing/severity/associated sxs/prior Treatment) Patient is a 60 y.o. male presenting with abdominal pain. The history is provided by the patient and a caregiver. The history is limited by a language barrier. A language interpreter was used.  Abdominal Pain Pain location:  Generalized Pain quality: cramping and dull   Pain severity:  Mild Onset quality:  Gradual Progression:  Worsening (lack of appetite, losing wt.) Chronicity:  Recurrent Context comment:  Used miralax and felt like going to die , so d/c'd. Associated symptoms: anorexia, constipation and fatigue   Associated symptoms: no diarrhea, no fever, no nausea and no vomiting     Past Medical History  Diagnosis Date  . Alcohol abuse   . Diabetes mellitus without complication (HCC)    History reviewed. No pertinent past surgical history. History reviewed. No pertinent family history. Social History  Substance Use Topics  . Smoking status: Never Smoker   . Smokeless tobacco: None  . Alcohol Use: Yes    Review of Systems  Constitutional: Positive for activity change, appetite change, fatigue and unexpected weight change. Negative for fever.  Respiratory: Negative.   Cardiovascular: Negative.   Gastrointestinal: Positive for abdominal pain, constipation and anorexia. Negative for nausea, vomiting and diarrhea.  All other systems reviewed and are negative.   Allergies  Review of patient's allergies indicates no known allergies.  Home Medications   Prior to Admission medications   Medication Sig Start Date End Date Taking? Authorizing Provider  carvedilol (COREG) 3.125 MG tablet Take 1 tablet (3.125 mg total) by mouth 2 (two) times daily with a meal. 05/25/15   [name], MD   furosemide (LASIX) 20 MG tablet TAKE 1 TABLET BY MOUTH DAILY 05/10/15   [name], MD  gabapentin (NEURONTIN) 100 MG capsule TAKE ONE CAPSULE BY MOUTH THREE TIMES DAILY 05/10/15   [name], MD  lactobacillus acidophilus & bulgar (LACTINEX) chewable tablet Chew 1 tablet by mouth 3 (three) times daily with meals.    Historical Provider, MD  metFORMIN (GLUCOPHAGE) 1000 MG tablet TAKE 1 TABLET BY MOUTH TWICE DAILY WITH A MEAL 06/03/15   [name], MD  naproxen (NAPROSYN) 500 MG tablet Take 1 tablet (500 mg total) by mouth 2 (two) times daily with a meal. 05/25/15   [name], MD  oxyCODONE-acetaminophen (PERCOCET/ROXICET) 5-325 MG tablet Take 1 tablet by mouth every 4 (four) hours as needed for moderate pain or severe pain.    Historical Provider, MD  polyethylene glycol powder (GLYCOLAX/MIRALAX) powder Take 17 g by mouth 2 (two) times daily. 06/03/15   [name], MD  senna-docusate (SENOKOT-S) 8.6-50 MG tablet Take 1 tablet by mouth at bedtime as needed for mild constipation. 06/03/15   [name], MD   Meds Ordered and Administered this Visit  Medications - No data to display  BP 140/91 mmHg  Pulse 101  Temp(Src) 97.8 F (36.6 C) (Oral)  Resp 16  SpO2 96% No data found.   Physical Exam  Constitutional: Vital signs are normal. He appears well-developed. He appears cachectic. He appears ill. No distress.  Neck: Normal range of motion. Neck supple.  Cardiovascular: Regular rhythm, normal heart sounds and intact distal pulses.   Pulmonary/Chest: Effort normal and breath sounds normal.  Abdominal: He  exhibits no distension and no mass. There is tenderness. There is no rebound and no guarding.  Musculoskeletal: He exhibits no edema.  Neurological: He is alert.  Skin: Skin is warm and dry.  Nursing note and vitals reviewed.   ED Course  Procedures (including critical care time)  Labs Review Labs Reviewed - No data to  display  Imaging Review No results found.   Visual Acuity Review  Right Eye Distance:   Left Eye Distance:   Bilateral Distance:    Right Eye Near:   Left Eye Near:    Bilateral Near:         MDM   1. Right upper quadrant pain    Sent for mult med issue eval. [facility].    Linna HoffJames D Sharniece Gibbon, MD 06/21/15 [phone]

## 2015-06-21 NOTE — ED Notes (Signed)
Pt sent here from urgent care.  Onset 2 weeks abd pain, decreased appetite and has dry mouth.  No vomiting or diarrhea.

## 2015-06-21 NOTE — ED Provider Notes (Signed)
CSN: [id number]     Arrival date & time 06/21/15  1939 History   First MD Initiated Contact with Patient 06/21/15 2110     Chief Complaint  Patient presents with  . Abdominal Pain    HPI   Randy Fisher is a 60 y.o. male with a PMH of DM who presents to the ED from urgent care with poor PO intake, weight loss, and abdominal pain x 2 weeks. He states he has not had anything to eat in 4-5 days. He reports associated constipation. He denies exacerbating factors. He has tried miralax and drinking water at home for symptom relief. He also notes left sided chest pain, which started on arrival to the ED. He denies fever, chills, shortness of breath, congestion, cough, nausea, vomiting, diarrhea.    Past Medical History  Diagnosis Date  . Alcohol abuse   . Diabetes mellitus without complication [facility])    Past Surgical History  Procedure Laterality Date  . 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 No pertinent family history. Social History  Substance Use Topics  . Smoking status: Never Smoker   . Smokeless tobacco: None  . Alcohol Use: Yes      Review of Systems  Constitutional: Negative for fever and chills.  Respiratory: Negative for shortness of breath.   Cardiovascular: Positive for chest pain.  Gastrointestinal: Positive for abdominal pain and constipation. Negative for nausea, vomiting and diarrhea.  Genitourinary: Negative for dysuria, urgency and frequency.  All other systems reviewed and are negative.     Allergies  Review of patient's allergies indicates no known allergies.  Home Medications   Prior to Admission medications   Medication Sig Start Date End Date Taking? Authorizing Provider  carvedilol (COREG) 3.125 MG tablet Take 1 tablet (3.125 mg total) by mouth 2 (two) times daily with a meal. 05/25/15  Yes [name], MD  furosemide (LASIX) 20 MG tablet TAKE 1 TABLET BY MOUTH DAILY 05/10/15  Yes [name], MD  gabapentin  (NEURONTIN) 100 MG capsule TAKE ONE CAPSULE BY MOUTH THREE TIMES DAILY 05/10/15  Yes [name], MD  lactobacillus acidophilus & bulgar (LACTINEX) chewable tablet Chew 1 tablet by mouth 3 (three) times daily with meals.   Yes Historical Provider, MD  metFORMIN (GLUCOPHAGE) 1000 MG tablet TAKE 1 TABLET BY MOUTH TWICE DAILY WITH A MEAL 06/03/15  Yes [name], MD  naproxen (NAPROSYN) 500 MG tablet Take 1 tablet (500 mg total) by mouth 2 (two) times daily with a meal. 05/25/15  Yes [name], MD  oxyCODONE-acetaminophen (PERCOCET/ROXICET) 5-325 MG tablet Take 1 tablet by mouth every 4 (four) hours as needed for moderate pain or severe pain.   Yes Historical Provider, MD  polyethylene glycol powder (GLYCOLAX/MIRALAX) powder Take 17 g by mouth 2 (two) times daily. 06/03/15  Yes [name], MD  senna-docusate (SENOKOT-S) 8.6-50 MG tablet Take 1 tablet by mouth at bedtime as needed for mild constipation. 06/03/15  Yes [name], MD    BP 148/99 mmHg  Pulse 92  Resp 19  Wt 68.992 kg  SpO2 100% Physical Exam  Constitutional: He is oriented to person, place, and time. No distress.  Frail appearing male in no acute distress.  HENT:  Head: Normocephalic and atraumatic.  Right Ear: External ear normal.  Left Ear: External ear normal.  Nose: Nose normal.  Mouth/Throat: Uvula is midline and oropharynx is clear and moist. Mucous membranes are  dry.  Mucous membranes dry.  Eyes: Conjunctivae, EOM and lids are normal. Pupils are equal, round, and reactive to light. Right eye exhibits no discharge. Left eye exhibits no discharge. No scleral icterus.  Neck: Normal range of motion. Neck supple.  Cardiovascular: Normal rate, regular rhythm, normal heart sounds, intact distal pulses and normal pulses.   Pulmonary/Chest: Effort normal and breath sounds normal. No respiratory distress. He has no wheezes. He has no rales.  Abdominal: Soft. Normal appearance and bowel sounds  are normal. He exhibits no distension and no mass. There is tenderness. There is no rigidity, no rebound and no guarding.  Mild TTP in RUQ.  Musculoskeletal: Normal range of motion. He exhibits no edema or tenderness.  Neurological: He is alert and oriented to person, place, and time.  Skin: Skin is warm, dry and intact. No rash noted. He is not diaphoretic. No erythema. No pallor.  Psychiatric: He has a normal mood and affect. His speech is normal and behavior is normal.  Nursing note and vitals reviewed.   ED Course  Procedures (including critical care time)  Labs Review Labs Reviewed  COMPREHENSIVE METABOLIC PANEL - Abnormal; Notable for the following:    CO2 7 (*)    Glucose, Bld 622 (*)    Creatinine, Ser 1.67 (*)    Total Protein 8.2 (*)    AST 11 (*)    ALT 13 (*)    Total Bilirubin 1.7 (*)    GFR calc non Af Amer 42 (*)    GFR calc Af Amer 49 (*)    Anion gap 26 (*)    All other components within normal limits  URINALYSIS, ROUTINE W REFLEX MICROSCOPIC (NOT AT [facility]) - Abnormal; Notable for the following:    Glucose, UA >1000 (*)    Bilirubin Urine SMALL (*)    Ketones, ur >80 (*)    All other components within normal limits  URINE MICROSCOPIC-ADD ON - Abnormal; Notable for the following:    Squamous Epithelial / LPF 0-5 (*)    Bacteria, UA RARE (*)    Casts HYALINE CASTS (*)    All other components within normal limits  CBG MONITORING, ED - Abnormal; Notable for the following:    Glucose-Capillary >600 (*)    All other components within normal limits  I-STAT VENOUS BLOOD GAS, ED - Abnormal; Notable for the following:    pH, Ven 7.199 (*)    pCO2, Ven 23.3 (*)    Bicarbonate 9.1 (*)    Acid-base deficit 17.0 (*)    All other components within normal limits  CBG MONITORING, ED - Abnormal; Notable for the following:    Glucose-Capillary 504 (*)    All other components within normal limits  LIPASE, BLOOD  CBC  BLOOD GAS, VENOUS  I-STAT TROPOININ, ED    Imaging  Review Dg Chest Portable 1 View  06/21/2015  CLINICAL DATA:  Abdominal pain and weakness.  Unknown chronicity EXAM: PORTABLE CHEST 1 VIEW COMPARISON:  None. FINDINGS: The lungs are clear. There is no large effusion. Moderate aortic tortuosity. Pulmonary vasculature is normal. IMPRESSION: Tortuous aorta.  No acute cardiopulmonary findings. Electronically Signed   By: Ellery Plunkaniel R Mitchell M.D.   On: 06/21/2015 22:21   I have personally reviewed and evaluated these images and lab results as part of my medical decision-making.   EKG Interpretation None      MDM   Final diagnoses:  Diabetic ketoacidosis associated with other specified diabetes mellitus (HCC)  Abdominal pain,  unspecified abdominal location    59 year old male presents from urgent care for further evaluation of poor PO intake, weight loss, and abdominal pain x 2 weeks. Also notes chest pain and constipation. Denies shortness of breath, fever, chills, nausea, vomiting, diarrhea.  Patient is afebrile. Vital signs stable. Heart regular rate and rhythm. Lungs clear to auscultation bilaterally. Abdomen soft, nondistended, with tenderness to palpation in RUQ. No rebound, guarding, or masses. Patient moves all extremities without difficulty. Normal neuro exam with no focal deficit.  CBC negative for leukocytosis or anemia. CMP remarkable for bicarb 7, glucose 622, creatinine 1.67, anion gap 26. Lipase within normal limits. UA with glucose and ketones.   Will obtain EKG, troponin, CXR, VBG. Will start fluids, insulin drip.  EKG sinus rhythm, HR 86. Troponin negative. CXR no acute cardiopulmonary findings.  VBG remarkable for pH 7.19, CO2 23.3, bicarbonate 9.1.  Family medicine consulted for admission. Patient to be admitted to Dr. Lum Babe. Patient discussed with Dr. Donnald Garre.  BP 148/99 mmHg  Pulse 92  Resp 19  Wt 68.992 kg  SpO2 100%    Mady Gemma, PA-C 06/22/15 0040  Arby Barrette, MD 06/22/15 [phone]

## 2015-06-22 DIAGNOSIS — R1032 Left lower quadrant pain: Secondary | ICD-10-CM

## 2015-06-22 DIAGNOSIS — E1110 Type 2 diabetes mellitus with ketoacidosis without coma: Secondary | ICD-10-CM | POA: Insufficient documentation

## 2015-06-22 DIAGNOSIS — E1310 Other specified diabetes mellitus with ketoacidosis without coma: Principal | ICD-10-CM

## 2015-06-22 DIAGNOSIS — R109 Unspecified abdominal pain: Secondary | ICD-10-CM

## 2015-06-22 DIAGNOSIS — N179 Acute kidney failure, unspecified: Secondary | ICD-10-CM

## 2015-06-22 DIAGNOSIS — I10 Essential (primary) hypertension: Secondary | ICD-10-CM

## 2015-06-22 DIAGNOSIS — I5032 Chronic diastolic (congestive) heart failure: Secondary | ICD-10-CM

## 2015-06-22 LAB — BASIC METABOLIC PANEL
ANION GAP: 9 (ref 5–15)
Anion gap: 10 (ref 5–15)
Anion gap: 8 (ref 5–15)
BUN: 8 mg/dL (ref 6–20)
BUN: 7 mg/dL (ref 6–20)
BUN: <5 mg/dL — ABNORMAL LOW (ref 6–20)
CALCIUM: 9.1 mg/dL (ref 8.9–10.3)
CHLORIDE: 121 mmol/L — AB (ref 101–111)
CHLORIDE: 122 mmol/L — AB (ref 101–111)
CHLORIDE: 121 mmol/L — AB (ref 101–111)
CO2: 19 mmol/L — AB (ref 22–32)
CO2: 18 mmol/L — ABNORMAL LOW (ref 22–32)
CO2: 19 mmol/L — AB (ref 22–32)
CREATININE: 0.93 mg/dL (ref 0.61–1.24)
Calcium: 8.9 mg/dL (ref 8.9–10.3)
Calcium: 9 mg/dL (ref 8.9–10.3)
Creatinine, Ser: 1.01 mg/dL (ref 0.61–1.24)
Creatinine, Ser: 0.8 mg/dL (ref 0.61–1.24)
GFR calc Af Amer: >60 mL/min (ref >60)
GFR calc Af Amer: >60 mL/min (ref >60)
GFR calc non Af Amer: >60 mL/min (ref >60)
GFR calc non Af Amer: >60 mL/min (ref >60)
GLUCOSE: 193 mg/dL — AB (ref 65–99)
GLUCOSE: 138 mg/dL — AB (ref 65–99)
GLUCOSE: 161 mg/dL — AB (ref 65–99)
POTASSIUM: 3.4 mmol/L — AB (ref 3.5–5.1)
POTASSIUM: 3.6 mmol/L (ref 3.5–5.1)
Potassium: 3.3 mmol/L — ABNORMAL LOW (ref 3.5–5.1)
SODIUM: 148 mmol/L — AB (ref 135–145)
Sodium: 150 mmol/L — ABNORMAL HIGH (ref 135–145)
Sodium: 149 mmol/L — ABNORMAL HIGH (ref 135–145)

## 2015-06-22 LAB — CBG MONITORING, ED
GLUCOSE-CAPILLARY: 270 mg/dL — AB (ref 65–99)
GLUCOSE-CAPILLARY: 293 mg/dL — AB (ref 65–99)
GLUCOSE-CAPILLARY: 229 mg/dL — AB (ref 65–99)
GLUCOSE-CAPILLARY: 179 mg/dL — AB (ref 65–99)
GLUCOSE-CAPILLARY: 125 mg/dL — AB (ref 65–99)
GLUCOSE-CAPILLARY: 138 mg/dL — AB (ref 65–99)
GLUCOSE-CAPILLARY: 289 mg/dL — AB (ref 65–99)
GLUCOSE-CAPILLARY: 155 mg/dL — AB (ref 65–99)
Glucose-Capillary: 161 mg/dL — ABNORMAL HIGH (ref 65–99)
Glucose-Capillary: 175 mg/dL — ABNORMAL HIGH (ref 65–99)
Glucose-Capillary: 233 mg/dL — ABNORMAL HIGH (ref 65–99)
Glucose-Capillary: 248 mg/dL — ABNORMAL HIGH (ref 65–99)
Glucose-Capillary: 394 mg/dL — ABNORMAL HIGH (ref 65–99)

## 2015-06-22 LAB — I-STAT VENOUS BLOOD GAS, ED
Acid-base deficit: 8 mmol/L — ABNORMAL HIGH (ref 0.0–2.0)
BICARBONATE: 18.4 meq/L — AB (ref 20.0–24.0)
O2 Saturation: 77 %
PH VEN: 7.259 (ref 7.250–7.300)
PO2 VEN: 48 mmHg — AB (ref 31.0–45.0)
Patient temperature: 37
TCO2: 20 mmol/L (ref 0–100)
pCO2, Ven: 41 mmHg — ABNORMAL LOW (ref 45.0–50.0)

## 2015-06-22 LAB — GLUCOSE, CAPILLARY
GLUCOSE-CAPILLARY: 139 mg/dL — AB (ref 65–99)
GLUCOSE-CAPILLARY: 363 mg/dL — AB (ref 65–99)

## 2015-06-22 LAB — MRSA PCR SCREENING: MRSA BY PCR: NEGATIVE

## 2015-06-22 LAB — MAGNESIUM: MAGNESIUM: 1.9 mg/dL (ref 1.7–2.4)

## 2015-06-22 LAB — TSH: TSH: 0.925 u[IU]/mL (ref 0.350–4.500)

## 2015-06-22 LAB — T4, FREE: FREE T4: 1.01 ng/dL (ref 0.61–1.12)

## 2015-06-22 MED ORDER — INSULIN ASPART 100 UNIT/ML ~~LOC~~ SOLN
0.0000 [IU] | Freq: Three times a day (TID) | SUBCUTANEOUS | Status: DC
  Administered 2015-06-22: 2 [IU] via SUBCUTANEOUS
  Administered 2015-06-23: 7 [IU] via SUBCUTANEOUS

## 2015-06-22 MED ORDER — SODIUM CHLORIDE 0.9 % IV SOLN
INTRAVENOUS | Status: AC
  Administered 2015-06-22: 01:00:00 via INTRAVENOUS

## 2015-06-22 MED ORDER — POTASSIUM CHLORIDE IN NACL 40-0.9 MEQ/L-% IV SOLN
INTRAVENOUS | Status: DC
  Administered 2015-06-22: 125 mL/h via INTRAVENOUS
  Filled 2015-06-22 (×3): qty 1000

## 2015-06-22 MED ORDER — INSULIN GLARGINE 100 UNIT/ML ~~LOC~~ SOLN
10.0000 [IU] | Freq: Every day | SUBCUTANEOUS | Status: DC
  Administered 2015-06-22: 10 [IU] via SUBCUTANEOUS
  Filled 2015-06-22 (×2): qty 0.1

## 2015-06-22 MED ORDER — DEXTROSE-NACL 5-0.45 % IV SOLN: INTRAVENOUS | Status: DC

## 2015-06-22 MED ORDER — LIVING WELL WITH DIABETES BOOK
Freq: Once | Status: DC
  Filled 2015-06-22: qty 1

## 2015-06-22 MED ORDER — POTASSIUM CHLORIDE 10 MEQ/100ML IV SOLN
10.0000 meq | INTRAVENOUS | Status: AC
  Administered 2015-06-22: 10 meq via INTRAVENOUS
  Filled 2015-06-22: qty 100

## 2015-06-22 MED ORDER — SODIUM CHLORIDE 0.9 % IV SOLN
INTRAVENOUS | Status: DC
  Administered 2015-06-22: 5.8 [IU]/h via INTRAVENOUS

## 2015-06-22 MED ORDER — POTASSIUM CHLORIDE 2 MEQ/ML IV SOLN
INTRAVENOUS | Status: DC
  Administered 2015-06-22: 17:00:00 via INTRAVENOUS
  Filled 2015-06-22 (×7): qty 1000

## 2015-06-22 MED ORDER — CARVEDILOL 3.125 MG PO TABS
3.1250 mg | ORAL_TABLET | Freq: Two times a day (BID) | ORAL | Status: DC
  Administered 2015-06-22 – 2015-06-24 (×6): 3.125 mg via ORAL
  Filled 2015-06-22 (×8): qty 1

## 2015-06-22 MED ORDER — POTASSIUM CHLORIDE 10 MEQ/100ML IV SOLN
10.0000 meq | Freq: Once | INTRAVENOUS | Status: AC
  Administered 2015-06-22: 10 meq via INTRAVENOUS

## 2015-06-22 MED ORDER — SODIUM CHLORIDE 0.9 % IV SOLN
INTRAVENOUS | Status: DC
  Administered 2015-06-22: 03:00:00 via INTRAVENOUS

## 2015-06-22 MED ORDER — INSULIN ASPART 100 UNIT/ML ~~LOC~~ SOLN
0.0000 [IU] | Freq: Every day | SUBCUTANEOUS | Status: DC
  Administered 2015-06-22: 5 [IU] via SUBCUTANEOUS

## 2015-06-22 MED ORDER — ENOXAPARIN SODIUM 40 MG/0.4ML ~~LOC~~ SOLN
40.0000 mg | Freq: Every day | SUBCUTANEOUS | Status: DC
  Administered 2015-06-23 – 2015-06-24 (×2): 40 mg via SUBCUTANEOUS
  Filled 2015-06-22 (×3): qty 0.4

## 2015-06-22 MED ORDER — GABAPENTIN 100 MG PO CAPS
100.0000 mg | ORAL_CAPSULE | Freq: Three times a day (TID) | ORAL | Status: DC
Start: 2015-06-22 — End: 2015-06-24
  Administered 2015-06-22 – 2015-06-24 (×7): 100 mg via ORAL
  Filled 2015-06-22 (×7): qty 1

## 2015-06-22 MED ORDER — POLYETHYLENE GLYCOL 3350 17 G PO PACK
17.0000 g | PACK | Freq: Two times a day (BID) | ORAL | Status: DC
  Administered 2015-06-22 – 2015-06-24 (×4): 17 g via ORAL
  Filled 2015-06-22 (×7): qty 1

## 2015-06-22 MED ORDER — SODIUM CHLORIDE 0.9 % IV SOLN: INTRAVENOUS | Status: DC

## 2015-06-22 MED ORDER — PHENOL 1.4 % MT LIQD
1.0000 | OROMUCOSAL | Status: DC | PRN
  Administered 2015-06-23: 1 via OROMUCOSAL
  Filled 2015-06-22: qty 177

## 2015-06-22 MED ORDER — POTASSIUM CHLORIDE 2 MEQ/ML IV SOLN: INTRAVENOUS | Status: DC

## 2015-06-22 NOTE — ED Notes (Signed)
Insulin drip stopped per dr's orders-- 1 hour after 10U lantus given--- CBG= 155

## 2015-06-22 NOTE — ED Notes (Addendum)
Called lab regarding BMP sent @ [phone]. Lab states the lab is on the machine and should result shortly.

## 2015-06-22 NOTE — ED Notes (Signed)
RN attempt blood draw unsuccessfully. Will have phlebotomy get labs.

## 2015-06-22 NOTE — ED Notes (Signed)
MD at bedside. 

## 2015-06-22 NOTE — Progress Notes (Signed)
Inpatient Diabetes Program Recommendations  AACE/ADA: New Consensus Statement on Inpatient Glycemic Control (2015)  Target Ranges:  Prepandial:   less than 140 mg/dL      Peak postprandial:   less than 180 mg/dL (1-2 hours)      Critically ill patients:  140 - 180 mg/dL   Review of Glycemic Control: Consult for diabetes education Patient in the ED, presenting with DKA. Labs being drawn to assist with determination of type 1 vs type 2 diabetes  Inpatient Diabetes Program Recommendations:    Patient presently on insulin drip, K+ still low, and C02 still slightly elevated. Would recommend patient remain on insulin drip at this point in order to determine insulin needs once during the transition to subcutaneous insulin. Will try to talk with patient and/or family and request an RD consult as well. Urine ketones  on admission were >80 which is only slightly elevated. Will follow and glad to make recommendations as results of patient's labs are back. Added carbohydrate modified to heart healthy diet orders with co-sign required.  Thank you Lenor CoffinAnn Taytum Scheck, RN, MSN, CDE  Diabetes Inpatient Program Office: [phone] Pager: [phone] 8:00 am to 5:00 pm

## 2015-06-22 NOTE — ED Notes (Signed)
CBG 179 

## 2015-06-22 NOTE — ED Notes (Signed)
CBG 293[place] Dec- Sara RN Aware

## 2015-06-22 NOTE — ED Notes (Signed)
CBG 233, see MAR.

## 2015-06-22 NOTE — ED Notes (Signed)
Pt speaks [place] -- from [place] -- if unable to get Swahili interpretor.

## 2015-06-22 NOTE — ED Notes (Signed)
Pt has an interpretor at bedside-- pt complains of sore throat when swallowing, only wants to eat applesauce and soft foods.

## 2015-06-22 NOTE — Progress Notes (Signed)
Family Medicine Teaching Service Daily Progress Note Intern Pager: [phone]  Patient name: Randy Omanorozi Chui Medical record number: [id number] Date of birth: 07/24/1955 Age: 60 y.o. Gender: male  Primary Care Provider: Shirlee LatchAngela Bacigalupo, MD Consultants: none Code Status: full  Pt Overview and Major Events to Date:  04/04: admitted with DKA  Assessment and Plan: Randy Fisher is a 60 y.o. male presenting with abdominal pain and found to be in DKA in ED. PMH is significant for Type II DM, grade 1 diastolic dysfunction, history of EtOH abuse, and chronic constipation.   Diabetic ketoacidosis: resolved. Likely 2/2 to reported illness and medication non-compliance. On presentation, CBG >600, pH 7.19, bicarb 9.1, AG 26, urine gluc >1000 and urine ketone >80. Last A1C 13 on 06/03/15. On metformin 500mg  BID at home.  - Transition to subQ insulins. Overlap with insulin drip for an hour - Continue D5-1/2NS + KCL 40 mEq at 125 ml/hr - BMP at 4 pm then, in the morning - CBG qACHS - Anti-GAD abs, TFT, Anti-islet abs, C-peptide - consult to DM educator [facility] for management of medication at discharge  Hypokalemia: K to 3.3>3.4. Likely intracellular shift with insulin -Added KCL 40 to IVF -Check Mg  Hypernatremia: Na 150 with normal range BG.  -continue D5-1/2NS with KCL  AKI: resolved. 1.01 this morning (baseline ~0.8). Likely prerenal 2/2 to dehydration.  - Continue IVF as above - Continue to monitor  - consider bladder scan to assess for urinary retention  Constipation: chronic issue. Likely gastroparesis in the setting of poorly controlled DM. Suppose to be on Miralax at home but didn't use it.  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 Taking Coreg 3.125mg  BID and Lasix at home. No cardiac sign or sign of fluid overload. Has reproducible chest pain this morning. - discontinue coreg - Hold Lasix at this time given hypovolemia  History of EtOH abuse: last drink  03/2014.  - Monitor for signs of withdrawal. CIWA if symptoms  FEN/GI:  -carb modified diet -IVF as above -Miralax  Prophylaxis: Lovenox  Disposition: regular floor. Possible discharge tomorrow if stable.  Subjective:  Reports neck pain with swallowing. Can drink fluid but hasn't been eating.   Objective: Temp:  [97.8 F (36.6 C)] 97.8 F (36.6 C) (04/04 2216) Pulse Rate:  [72-109] 72 (04/05 1214) Resp:  [10-25] 23 (04/05 1214) BP: (104-156)/(72-112) 125/76 mmHg (04/05 1214) SpO2:  [93 %-100 %] 98 % (04/05 1214) Weight:  [152 lb 1.6 oz (68.992 kg)-152 lb 6.4 oz (69.128 kg)] 152 lb 6.4 oz (69.128 kg) (04/04 2216) Physical Exam: Gen: appears sick Eyes: pupils equal, round and reactive to light Oropharynx: chapped lips, no apparent lesion Neck: supple, no LAD, full range of motion, no palpable mass CV: regular rate and rythm. S1 & S2 audible, no murmurs. Resp: no apparent work of breathing, clear to auscultation bilaterally. GI: bowel sounds normal, no tenderness to palpation, no rebound or guarding, no mass.  GU: no suprapubic tenderness Skin: no lesion, surgical scare over his left upper arm appears clean MSK: normal bulk Neuro: alert and oriented, no gross deficit Psych: flat affect, poor medical literacy  Laboratory:  Recent Labs Lab 06/21/15 2034  WBC 7.4  HGB 15.7  HCT 44.7  PLT 226    Recent Labs Lab 06/21/15 2034 06/22/15 0645 06/22/15 0834  NA 138 150* 148*  K 4.1 3.3* 3.4*  CL 105 121* 122*  CO2 7* 19* 18*  BUN 15  8 7  CREATININE 1.67* 1.01 0.93  CALCIUM 9.8 8.9 9.0  PROT 8.2*  --   --   BILITOT 1.7*  --   --   ALKPHOS 72  --   --   ALT 13*  --   --   AST 11*  --   --   GLUCOSE 622* 193* 138*    Imaging/Diagnostic Tests: No information on file.   Almon Hercules, MD 06/22/2015, 1:41 PM PGY-1, [facility] Family Medicine FPTS Intern pager: [phone], text pages welcome

## 2015-06-22 NOTE — ED Notes (Signed)
MD with patient. Will draw labs when MD finishes.

## 2015-06-23 LAB — C-PEPTIDE: C PEPTIDE: 0.3 ng/mL — AB (ref 1.1–4.4)

## 2015-06-23 LAB — BASIC METABOLIC PANEL
Anion gap: 10 (ref 5–15)
CHLORIDE: 116 mmol/L — AB (ref 101–111)
CO2: 20 mmol/L — ABNORMAL LOW (ref 22–32)
CREATININE: 0.81 mg/dL (ref 0.61–1.24)
Calcium: 8.9 mg/dL (ref 8.9–10.3)
GFR calc Af Amer: >60 mL/min (ref >60)
GFR calc non Af Amer: >60 mL/min (ref >60)
GLUCOSE: 332 mg/dL — AB (ref 65–99)
Potassium: 3.5 mmol/L (ref 3.5–5.1)
SODIUM: 146 mmol/L — AB (ref 135–145)

## 2015-06-23 LAB — GLUCOSE, CAPILLARY
GLUCOSE-CAPILLARY: 270 mg/dL — AB (ref 65–99)
GLUCOSE-CAPILLARY: 244 mg/dL — AB (ref 65–99)
Glucose-Capillary: 343 mg/dL — ABNORMAL HIGH (ref 65–99)
Glucose-Capillary: 377 mg/dL — ABNORMAL HIGH (ref 65–99)
Glucose-Capillary: 369 mg/dL — ABNORMAL HIGH (ref 65–99)

## 2015-06-23 LAB — ANTI-ISLET CELL ANTIBODY: Pancreatic Islet Cell Antibody: NEGATIVE

## 2015-06-23 LAB — T3, FREE: T3 FREE: 1.5 pg/mL — AB (ref 2.0–4.4)

## 2015-06-23 LAB — GLUTAMIC ACID DECARBOXYLASE AUTO ABS: Glutamic Acid Decarb Ab: <5 U/mL (ref 0.0–5.0)

## 2015-06-23 MED ORDER — INSULIN GLARGINE 100 UNIT/ML ~~LOC~~ SOLN
13.0000 [IU] | Freq: Every day | SUBCUTANEOUS | Status: DC
  Administered 2015-06-23 – 2015-06-24 (×2): 13 [IU] via SUBCUTANEOUS
  Filled 2015-06-23 (×2): qty 0.13

## 2015-06-23 MED ORDER — INSULIN STARTER KIT- PEN NEEDLES (ENGLISH)
1.0000 | Freq: Once | Status: AC
  Administered 2015-06-24: 1
  Filled 2015-06-23: qty 1

## 2015-06-23 MED ORDER — SODIUM CHLORIDE 0.45 % IV SOLN
INTRAVENOUS | Status: DC
  Administered 2015-06-23 – 2015-06-24 (×4): via INTRAVENOUS
  Filled 2015-06-23 (×6): qty 1000

## 2015-06-23 MED ORDER — MAGIC MOUTHWASH W/LIDOCAINE
5.0000 mL | Freq: Three times a day (TID) | ORAL | Status: DC
  Administered 2015-06-23 – 2015-06-24 (×4): 5 mL via ORAL
  Filled 2015-06-23 (×6): qty 5

## 2015-06-23 MED ORDER — INSULIN ASPART 100 UNIT/ML ~~LOC~~ SOLN
0.0000 [IU] | Freq: Three times a day (TID) | SUBCUTANEOUS | Status: DC
  Administered 2015-06-23: 15 [IU] via SUBCUTANEOUS
  Administered 2015-06-24: 2 [IU] via SUBCUTANEOUS
  Administered 2015-06-24: 3 [IU] via SUBCUTANEOUS
  Administered 2015-06-24: 5 [IU] via SUBCUTANEOUS

## 2015-06-23 MED ORDER — INSULIN ASPART 100 UNIT/ML ~~LOC~~ SOLN
0.0000 [IU] | Freq: Every day | SUBCUTANEOUS | Status: DC
  Administered 2015-06-23: 2 [IU] via SUBCUTANEOUS

## 2015-06-23 NOTE — Care Management Note (Signed)
Case Management Note  Patient Details  Name: Randy Fisher MRN: [id number] Date of Birth: 09/19/1955  Subjective/Objective:                    Action/Plan:   Expected Discharge Date:                  Expected Discharge Plan:  Home w Home Health Services  In-House Referral:     Discharge planning Services  CM Consult  Post Acute Care Choice:  Home Health Choice offered to:     DME Arranged:    DME Agency:     HH Arranged:  RN HH Agency:  [facility]  Status of Service:  Completed, signed off  Medicare Important Message Given:    Date Medicare IM Given:    Medicare IM give by:    Date Additional Medicare IM Given:    Additional Medicare Important Message give by:     If discussed at Long Length of Stay Meetings, dates discussed:    Additional Comments:  Randy Fisher, Randy Fisher Marie, RN 06/23/2015, 11:45 AM

## 2015-06-23 NOTE — Progress Notes (Signed)
Family Medicine Teaching Service Daily Progress Note Intern Pager: [phone]  Patient name: Randy Fisher Medical record number: [id number] Date of birth: 12-13-1955 Age: 60 y.o. Gender: male  Primary Care Provider: Shirlee Latch, MD Consultants: none Code Status: full  Pt Overview and Major Events to Date:  04/04: admitted with DKA  Assessment and Plan: Randy Fisher is a 60 y.o. male presenting with abdominal pain and found to be in DKA in ED. PMH is significant for Type II DM, grade 1 diastolic dysfunction, history of EtOH abuse, and chronic constipation.   Diabetic ketoacidosis: resolved. Likely 2/2 to reported illness and medication non-compliance. Hyperglycemic to mid 300's.  - D/c D5-1/2NS + KCL 40 mEq at 125 ml/hr - Start 1/2NS + KCL 40 mEq at 125 ml/hr - am BMP - Increased lantus 10->13 - Moderate SSI - CBG qACHS - f/u Anti-GAD abs, TFT, Anti-islet abs, C-peptide - appreciate diabetic care recs [facility] for management of medication at discharge-ordered. Will call [facility] (clinic) for this.  Hypokalemia: K to 3.3>3.5. Likely intracellular shift with insulin. Mg normal -KCL as above  Hypernatremia: Na 146, corrected 149 -1/2NS with KCL -BMP in the morning  AKI: resolved. 0.81 this morning (baseline ~0.8). Likely prerenal 2/2 to dehydration.  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 Taking Coreg 3.125mg  BID and Lasix at home. No cardiac sign or sign of fluid overload. Has reproducible chest pain this morning. - continue coreg - Hold Lasix at this time given hypovolemia  History of EtOH abuse: last drink 03/2014.  - Monitor for signs of withdrawal. CIWA if symptoms  FEN/GI:  -carb modified diet -IVF as above -Miralax  Prophylaxis: Lovenox  Disposition: regular floor. Possible discharge tomorrow if stable.  Subjective:  Reports neck pain with  swallowing (solid and liquid).  Objective: Temp:  [97.6 F (36.4 C)-98.5 F (36.9 C)] 97.6 F (36.4 C) (04/06 0534) Pulse Rate:  [80-95] 89 (04/06 0844) Resp:  [12-19] 18 (04/06 0534) BP: (123-150)/(70-97) 150/97 mmHg (04/06 0844) SpO2:  [97 %-100 %] 100 % (04/06 0534) Weight:  [159 lb (72.122 kg)] 159 lb (72.122 kg) (04/05 1730) Physical Exam: Gen: sitting in chair talking on phone Oropharynx: moist, no lesion Neck: supple, no LAD, full range of motion, no palpable mass, but tender to palpation CV: regular rate and rythm. S1 & S2 audible, no murmurs. Resp: no apparent work of breathing, clear to auscultation bilaterally. GI: bowel sounds normal, no tenderness to palpation, no rebound or guarding, no mass.  GU: no suprapubic tenderness Skin: no lesion, surgical scare over his left upper arm appears clean MSK: normal bulk Neuro: alert and oriented, no gross deficit Psych: flat affect, poor medical literacy  Laboratory:  Recent Labs Lab 06/21/15 2034  WBC 7.4  HGB 15.7  HCT 44.7  PLT 226    Recent Labs Lab 06/21/15 2034  06/22/15 0834 06/22/15 1719 06/23/15 0505  NA 138  < > 148* 149* 146*  K 4.1  < > 3.4* 3.6 3.5  CL 105  < > 122* 121* 116*  CO2 7*  < > 18* 19* 20*  BUN 15  < > 7 <5* <5*  CREATININE 1.67*  < > 0.93 0.80 0.81  CALCIUM 9.8  < > 9.0 9.1 8.9  PROT 8.2*  --   --   --   --   BILITOT 1.7*  --   --   --   --  ALKPHOS 72  --   --   --   --   ALT 13*  --   --   --   --   AST 11*  --   --   --   --   GLUCOSE 622*  < > 138* 161* 332*  < > = values in this interval not displayed.  Imaging/Diagnostic Tests: No information on file.  Almon Herculesaye T Gonfa, MD 06/23/2015, 12:33 PM PGY-1, [place] [facility] FPTS Intern pager: [phone], text pages welcome

## 2015-06-23 NOTE — Progress Notes (Addendum)
Inpatient Diabetes Program Recommendations  AACE/ADA: New Consensus Statement on Inpatient Glycemic Control (2015)  Target Ranges:  Prepandial:   less than 140 mg/dL      Peak postprandial:   less than 180 mg/dL (1-2 hours)      Critically ill patients:  140 - 180 mg/dL   Review of Glycemic Control Results for Randy Fisher, Randy Fisher (MRN [id number]) as of 06/23/2015 13:25  Ref. Range 06/22/2015 22:30 06/23/2015 04:16 06/23/2015 07:55 06/23/2015 11:42  Glucose-Capillary Latest Ref Range: 65-99 mg/dL 363 (H) 270 (H) 343 (H) 377 (H)   Inpatient Diabetes Program Recommendations:  Prior to transitioning off of insulin, patient required 2 units/hr which = approx. 48 units/24 hrs. To keep glucose within range. Spoke with Dr. Cyndia Skeeters and discussed recommendations of increasing Lantus insulin to 20 units daily + 7 units tid with meals. Concerned if patient would be able to follow sliding insulin scale. Left message with person who interpreted for patient yesterday to call to include in diabetes education. Will order insulin pen starter kit, LWWD (interpretor will need to assist). Nurse to start teaching insulin administration when comes to interpret for patient.  MD states he believes Dextrose in IV fluid contributed to current hyperglycemia. Dextrose now stopped.  MD at time of discharge consider: Insulin pen needle order # ([id number]) Glucose meter kit order #([id number])  2:45 p.m. Spoke with patient utilizing Swahili phone interpretor. Patient states "I'm unable to eat due to my throat hurting." States his throat pain is an 8/10 continuous pain. Nurse Ander Purpura in during conversation with patient/interpretor. Spoke with Dr. Claris Che regarding conversation with patient.  MD had ordered medications regarding throat pain. Glucose levels trending upward with each meal regardless of correction scale indicating possible need for meal coverage. Labs GAD antibodies/C Peptide usually take at least 2 days for results.  Thank you, Nani Gasser.  Miqueas Whilden, RN, MSN, CDE Inpatient Glycemic Control Team Team Pager [phone] (8am-5pm) 06/23/2015 1:49 PM

## 2015-06-23 NOTE — Progress Notes (Signed)
Inpatient Diabetes Program Recommendations  AACE/ADA: New Consensus Statement on Inpatient Glycemic Control (2015)  Target Ranges:  Prepandial:   less than 140 mg/dL      Peak postprandial:   less than 180 mg/dL (1-2 hours)      Critically ill patients:  140 - 180 mg/dL   Review of Glycemic Control  Diabetes history: DM Type 2, however pending C Peptide and GAD antibodies Outpatient Diabetes medications: Metformin 1 gm bid Current orders for Inpatient glycemic control: Lantus 13 units daily + Novolog correction Sensitive scale tid  Inpatient Diabetes Program Recommendations:  Spoke with nurse and plan to speak with patient with interpretor. May need insulin on discharge. Please consider meal coverage Novolog 3 units tid with meals (hold if eats < 50 %) + increase Lantus to 18 units daily.  Thank you, Randy FischerJudy E. Carrol Hougland, RN, MSN, CDE Inpatient Glycemic Control Team Team Pager [phone] (8am-5pm) 06/23/2015 8:29 AM

## 2015-06-24 LAB — BASIC METABOLIC PANEL
Anion gap: 7 (ref 5–15)
CALCIUM: 8.9 mg/dL (ref 8.9–10.3)
CHLORIDE: 114 mmol/L — AB (ref 101–111)
CO2: 25 mmol/L (ref 22–32)
CREATININE: 0.72 mg/dL (ref 0.61–1.24)
GFR calc Af Amer: >60 mL/min (ref >60)
GFR calc non Af Amer: >60 mL/min (ref >60)
GLUCOSE: 223 mg/dL — AB (ref 65–99)
Potassium: 3.6 mmol/L (ref 3.5–5.1)
Sodium: 146 mmol/L — ABNORMAL HIGH (ref 135–145)

## 2015-06-24 LAB — GLUCOSE, CAPILLARY
Glucose-Capillary: 189 mg/dL — ABNORMAL HIGH (ref 65–99)
Glucose-Capillary: 233 mg/dL — ABNORMAL HIGH (ref 65–99)
Glucose-Capillary: 142 mg/dL — ABNORMAL HIGH (ref 65–99)

## 2015-06-24 MED ORDER — INSULIN GLARGINE 100 UNIT/ML ~~LOC~~ SOLN: 15.0000 [IU] | Freq: Every day | SUBCUTANEOUS | Status: DC

## 2015-06-24 MED ORDER — GLUCOSE BLOOD VI STRP: ORAL_STRIP | Status: DC

## 2015-06-24 MED ORDER — ACCU-CHEK AVIVA DEVI: Status: AC

## 2015-06-24 MED ORDER — ATORVASTATIN CALCIUM 40 MG PO TABS: 40.0000 mg | ORAL_TABLET | Freq: Every day | ORAL | Status: DC

## 2015-06-24 MED ORDER — ACCU-CHEK SOFTCLIX LANCET DEV MISC: Status: DC

## 2015-06-24 MED ORDER — METFORMIN HCL 1000 MG PO TABS: 1000.0000 mg | ORAL_TABLET | Freq: Two times a day (BID) | ORAL | Status: DC

## 2015-06-24 MED ORDER — METFORMIN HCL 500 MG PO TABS
500.0000 mg | ORAL_TABLET | Freq: Two times a day (BID) | ORAL | Status: DC
  Administered 2015-06-24: 500 mg via ORAL
  Filled 2015-06-24: qty 1

## 2015-06-24 MED ORDER — LISINOPRIL 5 MG PO TABS: 2.5000 mg | ORAL_TABLET | Freq: Every day | ORAL | Status: DC

## 2015-06-24 MED ORDER — ACCU-CHEK SOFTCLIX LANCET DEV MISC
Status: DC
Start: 2015-06-24 — End: 2015-06-24

## 2015-06-24 MED ORDER — ASPIRIN 81 MG PO TBEC: 81.0000 mg | DELAYED_RELEASE_TABLET | Freq: Every day | ORAL | Status: DC

## 2015-06-24 MED ORDER — ASPIRIN EC 81 MG PO TBEC
81.0000 mg | DELAYED_RELEASE_TABLET | Freq: Every day | ORAL | Status: DC
  Administered 2015-06-24: 81 mg via ORAL
  Filled 2015-06-24: qty 1

## 2015-06-24 MED ORDER — ATORVASTATIN CALCIUM 40 MG PO TABS
40.0000 mg | ORAL_TABLET | Freq: Every day | ORAL | Status: DC
  Administered 2015-06-24: 40 mg via ORAL
  Filled 2015-06-24: qty 1

## 2015-06-24 MED ORDER — ACCU-CHEK SOFTCLIX LANCETS MISC: Status: DC

## 2015-06-24 NOTE — Discharge Summary (Signed)
Family Medicine Teaching [facility] Discharge Summary  Patient name: Randy Fisher Medical record number: [id number] Date of birth: 1955-08-28 Age: 60 y.o. Gender: male Date of Admission: 06/21/2015  Date of Discharge: 06/24/2015 Admitting Physician: Doreene Eland, MD  Primary Care Provider: Shirlee Latch, MD Consultants: Diabetic Coordinator  Indication for Hospitalization: DKA  Discharge Diagnoses/Problem List:  DM-2 Dyslipidemia HFpEF with G1DD Hypernatremia  Disposition: home  Discharge Condition: stable  Discharge Exam:  Gen: lying in bed Oropharynx: moist, no lesion Neck: supple, no LAD, full range of motion, no palpable mass, but tender to palpation CV: regular rate and rythm. S1 & S2 audible, no murmurs. Resp: no apparent work of breathing, clear to auscultation bilaterally. GI: bowel sounds normal, no tenderness to palpation, no rebound or guarding, no mass.  GU: no suprapubic tenderness Skin: no lesion, surgical scare over his left upper arm appears clean MSK: normal bulk Neuro: alert and oriented, no gross deficit  Brief Hospital Course:  Randy Fisher is a 60 y.o. male with history of DM, HFpEF G1DD, history of EtOH abuse (remote), and chronic constipation who was admitted with DKA partly due to poor compliance with his medication. Patient with A1c to 13 about a week prior to admission but remained on metformin.   DKA/DM: on admission, patient received NS boluses for rehydration and started on glucomander (insulin drip with two bag rehydration protocol) with subsequent resolution of his DKA. Then, he was transitioned to subcutaneous insulins, Lantus 10 units q24hr and Novolog thin SSI . Initially, patients glucose high in 300's partly due to dextrose contained fluid he was left on for hypernatremia, poor oral intake and suboptimal insulin doses. Hyperglycemia improved with increase in his insulin doses (lantus to 13u and SSI to moderate) & discontinuation of  his dextrose containing fluid. Patient's poor oral intake was due to sore throat/neck pain with swallowing, which improved with magic mouth wash. Neck and oropharyngeal exam negative except for some tenderness to palpation of his neck anteriorily and proximally. There was no sign of fungal infection.  Patient had an autoimmune study for characterization of his diabetes. His Anti-GAD, anti-islet antibodies, TSH and Free T4 neg were within normal range suggesting DM-2. C-peptide 0.3 (low) suggestive for B-cells burnout vs DM-1.  Prior to discharge, patient well hydrated, tolerating good oral intake, received diabetic education and demonstrated ability to check his CBG and administer his inulin with supervision of his nurses and diabetic educator. On the day of discharge, his FBG and RBG in 180's and 220's. Although these are not at goal based on ADA guidelines, they have trended down significantly with changes made to his insulin dose and fluids. His Lantus was also increased to 15 units at discharge.  His Novolog was not continued on discharge to minimize confusion given his low medical literacy. His metformin was resumed at 1 gm twice a day.  Patient was discharged with home health nurse for management of his medications. Given his low medical literacy, he may benefit from continuation of home health as outpatient. Our outpatient social work (Ms. Stanton Kidney) aware of the situation and happy to help once the inpatient [facility] stopped going out to his house.  Patient with ASCVD risk score to 17.5% (without including HTN). Started on Atorvastatin and Aspirin. He was also started on Lisinopril 5 mg daily for renal protection. Recommend titrating ACEi as tolerated.  Hypernatremia: improved. Patient with Na to 160 (corrected) on presentation. Remained on hypotonic solution until the day of discharge. Sodium (corrected) at 148  on the day of discharge.   Hypokalemia: K to 3.3>40 mEq KCl x2>3.6. Likely intracellular  shift with insulin. Mg normal  AKI: resolved. (baseline ~0.8). Likely prerenal 2/2 to dehydration.   Neck pain/Odophagyia: improved with magic mouth wash. No concerning finding exam. No sign of fungal infection or constitutional symptoms to suggest malignancy.  Constipation: resolved. Had daily BM with Miralax 30 mg twice a day.   HFpEF: Last echo 06/16 with EF 65-70%. No motion abnormality or structural lesion. Taking Coreg 3.125mg  BID and Lasix at home. No cardiac sign or sign of fluid overload. Discontinued coreg and continued lasix as needed for leg swelling. Currently no sign of fluid overload.   Disposition: regular floor. Discharge today on Lantus and metformin.  Issues for Follow Up:  1. DM-2: check point of care glucose. Assess compliance and mastery of his CBG check and insulin administration. Patient was given specific instruction to record his FBG and RBG and bring to clinic. Review and make adjustment as needed. Touch base with Ms Stanton Kidney (SW) for renewal of [facility] as outpatient. Started on lisinopril 5 mg. Recommend checking his BMP 2. Hypernatremia: Na 148 on discharge. Check BMP  Significant Procedures: none  Significant Labs and Imaging:   Recent Labs Lab 06/21/15 2034  WBC 7.4  HGB 15.7  HCT 44.7  PLT 226    Recent Labs Lab 06/21/15 2034 06/22/15 0645 06/22/15 0834 06/22/15 1226 06/22/15 1719 06/23/15 0505 06/24/15 0938  NA 138 150* 148*  --  149* 146* 146*  K 4.1 3.3* 3.4*  --  3.6 3.5 3.6  CL 105 121* 122*  --  121* 116* 114*  CO2 7* 19* 18*  --  19* 20* 25  GLUCOSE 622* 193* 138*  --  161* 332* 223*  BUN --  <5* <5* <5*  CREATININE 1.67* 1.01 0.93  --  0.80 0.81 0.72  CALCIUM 9.8 8.9 9.0  --  9.1 8.9 8.9  MG  --   --   --  1.9  --   --   --   ALKPHOS 72  --   --   --   --   --   --   AST 11*  --   --   --   --   --   --   ALT 13*  --   --   --   --   --   --   ALBUMIN 3.5  --   --   --   --   --   --     Results/Tests Pending at Time of  Discharge: none  Discharge Medications:    Medication List    STOP taking these medications        carvedilol 3.125 MG tablet  Commonly known as:  COREG     naproxen 500 MG tablet  Commonly known as:  NAPROSYN     oxyCODONE-acetaminophen 5-325 MG tablet  Commonly known as:  PERCOCET/ROXICET      TAKE these medications        ACCU-CHEK AVIVA device  Use as instructed     accu-chek softclix lancets  Use to check your blood sugar in the morning before breakfast, 2 hours after lunch and dinner     ACCU-CHEK SOFTCLIX LANCETS lancets  Use to check your blood sugar in the morning before breakfast, 2 hours after lunch and dinner     aspirin 81 MG EC tablet  Take 1 tablet (81  mg total) by mouth daily.     atorvastatin 40 MG tablet  Commonly known as:  LIPITOR  Take 1 tablet (40 mg total) by mouth daily at 6 PM.     furosemide 20 MG tablet  Commonly known as:  LASIX  TAKE 1 TABLET BY MOUTH DAILY     gabapentin 100 MG capsule  Commonly known as:  NEURONTIN  TAKE ONE CAPSULE BY MOUTH THREE TIMES DAILY     glucose blood test strip  Commonly known as:  ACCU-CHEK AVIVA  Use to check your blood sugar in the morning before breakfast, 2 hours after lunch and dinner     insulin glargine 100 UNIT/ML injection  Commonly known as:  LANTUS  Inject 0.15 mLs (15 Units total) into the skin daily.     lactobacillus acidophilus & bulgar chewable tablet  Chew 1 tablet by mouth 3 (three) times daily with meals.     lisinopril 5 MG tablet  Commonly known as:  ZESTRIL  Take 0.5 tablets (2.5 mg total) by mouth daily.     metFORMIN 1000 MG tablet  Commonly known as:  GLUCOPHAGE  Take 1 tablet (1,000 mg total) by mouth 2 (two) times daily with a meal.     polyethylene glycol powder powder  Commonly known as:  GLYCOLAX/MIRALAX  Take 17 g by mouth 2 (two) times daily.     senna-docusate 8.6-50 MG tablet  Commonly known as:  Senokot-S  Take 1 tablet by mouth at bedtime as needed for  mild constipation.        Discharge Instructions: Please refer to Patient Instructions section of EMR for full details.  Patient was counseled important signs and symptoms that should prompt return to medical care, changes in medications, dietary instructions, activity restrictions, and follow up appointments.   Follow-Up Appointments:   Almon Herculesaye T Tiffanye Hartmann, MD 06/25/2015, 12:08 PM PGY-1, [facility]

## 2015-06-24 NOTE — Progress Notes (Signed)
Patient discharged home with instructions, that was given with the help of interpreter.

## 2015-06-24 NOTE — Progress Notes (Signed)
Inpatient Diabetes Program Recommendations  AACE/ADA: New Consensus Statement on Inpatient Glycemic Control (2015)  Target Ranges:  Prepandial:   less than 140 mg/dL      Peak postprandial:   less than 180 mg/dL (1-2 hours)      Critically ill patients:  140 - 180 mg/dL   Review of Glycemic Control Results for Randy Fisher, Randy Fisher (MRN [id number]) as of 06/24/2015 10:30  Ref. Range 06/23/2015 07:55 06/23/2015 11:42 06/23/2015 18:13 06/23/2015 22:00 06/24/2015 07:24  Glucose-Capillary Latest Ref Range: 65-99 mg/dL 343 (H) 377 (H) 369 (H) 244 (H) 189 (H)   Inpatient Diabetes Program Recommendations:  Spoke with patient utilizing Swahili interpretor. Reviewed with patient to limit foods high in carbohydrates. Reviewed with patient use of insulin pen and nurse to teach using pen demonstration kit. Patient states he wants to take care of himself but has limited help. Explained he will also have home health to follow after discharge. RN Remo Lipps reviewed with patient this am as she administered insulin and plans to allow patient to give himself his injection. Has insulin starter kit at bedside and have ordered the Living Well with Diabetes (explained to patient the book is in [place] and will need assistance from his friend that speaks [place]). Patient is now eating much better and states his throat feels much better.  Thank you, Nani Gasser. Hildur Bayer, RN, MSN, CDE Inpatient Glycemic Control Team Team Pager [phone] (8am-5pm) 06/24/2015 10:41 AM

## 2015-06-24 NOTE — Discharge Instructions (Signed)
It has been a pleasure taking care of you! You were admitted due to diabetic ketoacidosis, which is a metabolic disturbance that happens with uncontrolled blood sugar (diabetes). We have treated your with fluids and insulin.  We are discharging you on insulin and other medications that you need to continue taking after you leave the hospital. We may have also made some adjustments to your other medications. Please, make sure to read the directions before you take them. The names and directions on how to take these medications are found on this discharge paper under medication section.  Once you go home -We would like you to check your blood glucose in the morning before you eat and two hours after your lunch or dinner. Write down the numbers with date and time and bring it with you when you come to clinic.  -Inject your Lantus 15 units every day at 10 am -Take your other medications as prescribed  You also need a follow up with your primary care doctor. The address, date and time are found on the discharge paper under follow up section.  See below for more about diabetes!  Take care,  Type 2 Diabetes Mellitus, Adult Type 2 diabetes mellitus, often simply referred to as type 2 diabetes, is a long-lasting (chronic) disease. In type 2 diabetes, the pancreas does not make enough insulin (a hormone), the cells are less responsive to the insulin that is made (insulin resistance), or both. Normally, insulin moves sugars from food into the tissue cells. The tissue cells use the sugars for energy. The lack of insulin or the lack of normal response to insulin causes excess sugars to build up in the blood instead of going into the tissue cells. As a result, high blood sugar (hyperglycemia) develops. The effect of high sugar (glucose) levels can cause many complications. Type 2 diabetes was also previously called adult-onset diabetes, but it can occur at any age.  RISK FACTORS  A person is predisposed to  developing type 2 diabetes if someone in the family has the disease and also has one or more of the following primary risk factors:  Weight gain, or being overweight or obese.  An inactive lifestyle.  A history of consistently eating high-calorie foods. Maintaining a normal weight and regular physical activity can reduce the chance of developing type 2 diabetes. SYMPTOMS  A person with type 2 diabetes may not show symptoms initially. The symptoms of type 2 diabetes appear slowly. The symptoms include:  Increased thirst (polydipsia).  Increased urination (polyuria).  Increased urination during the night (nocturia).  Sudden or unexplained weight changes.  Frequent, recurring infections.  Tiredness (fatigue).  Weakness.  Vision changes, such as blurred vision.  Fruity smell to your breath.  Abdominal pain.  Nausea or vomiting.  Cuts or bruises which are slow to heal.  Tingling or numbness in the hands or feet.  An open skin wound (ulcer). DIAGNOSIS Type 2 diabetes is frequently not diagnosed until complications of diabetes are present. Type 2 diabetes is diagnosed when symptoms or complications are present and when blood glucose levels are increased. Your blood glucose level may be checked by one or more of the following blood tests:  A fasting blood glucose test. You will not be allowed to eat for at least 8 hours before a blood sample is taken.  A random blood glucose test. Your blood glucose is checked at any time of the day regardless of when you ate.  A hemoglobin A1c blood glucose test.  A hemoglobin A1c test provides information about blood glucose control over the previous 3 months.  An oral glucose tolerance test (OGTT). Your blood glucose is measured after you have not eaten (fasted) for 2 hours and then after you drink a glucose-containing beverage. TREATMENT   You may need to take insulin or diabetes medicine daily to keep blood glucose levels in the desired  range.  If you use insulin, you may need to adjust the dosage depending on the carbohydrates that you eat with each meal or snack.  Lifestyle changes are recommended as part of your treatment. These may include:  Following an individualized diet plan developed by a nutritionist or dietitian.  Exercising daily. Your health care providers will set individualized treatment goals for you based on your age, your medicines, how long you have had diabetes, and any other medical conditions you have. Generally, the goal of treatment is to maintain the following blood glucose levels:  Before meals (preprandial): 80-140 mg/dL.  After meals (postprandial): below 180 mg/dL.  A1c: less than 6.5-7%. HOME CARE INSTRUCTIONS   Have your hemoglobin A1c level checked twice a year.  Perform daily blood glucose monitoring as directed by your health care provider.  Monitor urine ketones when you are ill and as directed by your health care provider.  Take your diabetes medicine or insulin as directed by your health care provider to maintain your blood glucose levels in the desired range.  Never run out of diabetes medicine or insulin. It is needed every day.  If you are using insulin, you may need to adjust the amount of insulin given based on your intake of carbohydrates. Carbohydrates can raise blood glucose levels but need to be included in your diet. Carbohydrates provide vitamins, minerals, and fiber which are an essential part of a healthy diet. Carbohydrates are found in fruits, vegetables, whole grains, dairy products, legumes, and foods containing added sugars.  Eat healthy foods. You should make an appointment to see a registered dietitian to help you create an eating plan that is right for you.  Lose weight if you are overweight.  Carry a medical alert card or wear your medical alert jewelry.  Carry a 15-gram carbohydrate snack with you at all times to treat low blood glucose (hypoglycemia).  Some examples of 15-gram carbohydrate snacks include:  Glucose tablets, 3 or 4.  Glucose gel, 15-gram tube.  Raisins, 2 tablespoons (24 grams).  Jelly beans, 6.  Animal crackers, 8.  Regular pop, 4 ounces (120 mL).  Gummy treats, 9.  Recognize hypoglycemia. Hypoglycemia occurs with blood glucose levels of 70 mg/dL and below. The risk for hypoglycemia increases when fasting or skipping meals, during or after intense exercise, and during sleep. Hypoglycemia symptoms can include:  Tremors or shakes.  Decreased ability to concentrate.  Sweating.  Increased heart rate.  Headache.  Dry mouth.  Hunger.  Irritability.  Anxiety.  Restless sleep.  Altered speech or coordination.  Confusion.  Treat hypoglycemia promptly. If you are alert and able to safely swallow, follow the 15:15 rule:  Take 15-20 grams of rapid-acting glucose or carbohydrate. Rapid-acting options include glucose gel, glucose tablets, or 4 ounces (120 mL) of fruit juice, regular soda, or low-fat milk.  Check your blood glucose level 15 minutes after taking the glucose.  Take 15-20 grams more of glucose if the repeat blood glucose level is still 70 mg/dL or below.  Eat a meal or snack within 1 hour once blood glucose levels return to normal.  Be alert to feeling very thirsty and urinating more frequently than usual, which are early signs of hyperglycemia. An early awareness of hyperglycemia allows for prompt treatment. Treat hyperglycemia as directed by your health care provider.  Engage in at least 150 minutes of moderate-intensity physical activity a week, spread over at least 3 days of the week or as directed by your health care provider. In addition, you should engage in resistance exercise at least 2 times a week or as directed by your health care provider. Try to spend no more than 90 minutes at one time inactive.  Adjust your medicine and food intake as needed if you start a new exercise or  sport.  Follow your sick-day plan anytime you are unable to eat or drink as usual.  Do not use any tobacco products including cigarettes, chewing tobacco, or electronic cigarettes. If you need help quitting, ask your health care provider.  Limit alcohol intake to no more than 1 drink per day for nonpregnant women and 2 drinks per day for men. You should drink alcohol only when you are also eating food. Talk with your health care provider whether alcohol is safe for you. Tell your health care provider if you drink alcohol several times a week.  Keep all follow-up visits as directed by your health care provider. This is important.  Schedule an eye exam soon after the diagnosis of type 2 diabetes and then annually.  Perform daily skin and foot care. Examine your skin and feet daily for cuts, bruises, redness, nail problems, bleeding, blisters, or sores. A foot exam by a health care provider should be done annually.  Brush your teeth and gums at least twice a day and floss at least once a day. Follow up with your dentist regularly.  Share your diabetes management plan with your workplace or school.  Keep your immunizations up to date. It is recommended that you receive a flu (influenza) vaccine every year. It is also recommended that you receive a pneumonia (pneumococcal) vaccine. If you are 47 years of age or older and have never received a pneumonia vaccine, this vaccine may be given as a series of two separate shots. Ask your health care provider which additional vaccines may be recommended.  Learn to manage stress.  Obtain ongoing diabetes education and support as needed.  Participate in or seek rehabilitation as needed to maintain or improve independence and quality of life. Request a physical or occupational therapy referral if you are having foot or hand numbness, or difficulties with grooming, dressing, eating, or physical activity. SEEK MEDICAL CARE IF:   You are unable to eat food or  drink fluids for more than 6 hours.  You have nausea and vomiting for more than 6 hours.  Your blood glucose level is over 240 mg/dL.  There is a change in mental status.  You develop an additional serious illness.  You have diarrhea for more than 6 hours.  You have been sick or have had a fever for a couple of days and are not getting better.  You have pain during any physical activity.  SEEK IMMEDIATE MEDICAL CARE IF:  You have difficulty breathing.  You have moderate to large ketone levels.   This information is not intended to replace advice given to you by your health care provider. Make sure you discuss any questions you have with your health care provider.   Document Released: 03/05/2005 Document Revised: 11/24/2014 Document Reviewed: 10/02/2011 Elsevier Interactive Patient Education 2016 [employer] ° °

## 2015-06-24 NOTE — Progress Notes (Signed)
Family Medicine Teaching Service Daily Progress Note Intern Pager: [phone]  Patient name: Randy Fisher Medical record number: [id number] Date of birth: 01/29/1956 Age: 60 y.o. Gender: male  Primary Care Provider: Shirlee Latch, MD Consultants: none Code Status: full  Pt Overview and Major Events to Date:  04/04: admitted with DKA  Assessment and Plan: Randy Fisher is a 60 y.o. male presenting with abdominal pain and found to be in DKA in ED. PMH is significant for Type II DM, grade 1 diastolic dysfunction, history of EtOH abuse, and chronic constipation.   Diabetic ketoacidosis: resolved. Likely 2/2 to reported illness and medication non-compliance. CBG's improving. Anti-GAD and anti-islet neg. TSH and Free T4 neg. Suggestive DM-2. C-peptide 0.3 (low) suggestive forB-cells burnout vs DM-1. - Lantus 13u - Moderate SSI - Metformin 500 mg twice a day. - CBG qACHS - appreciate diabetic care recs - HHRN for management of medication at discharge-ordered. Will call [facility] (clinic) for this.  Dyslipidemia: HDL 29, LDL 109. ASCVD score 17.9% not including HTN. -Start Atorvastatin 40 mg once daily -Start ASA 81 mg  Hypernatremia: improving. Na 146, corrected 148. -1/2NS with KCL -BMP in the morning  Hypokalemia: K to 3.3>>3.6. Likely intracellular shift with insulin. Mg normal -KCL as above  AKI: resolved. (baseline ~0.8). Likely prerenal 2/2 to dehydration.  - Continue IVF as above - Continue to monitor   Neck pain/Odophagyia: improved. No concerning finding exam. -Continue magic mouth wash with lidocaine.  Constipation: resolved. Had daily BM - Miralax 30 mg twice a day   HFpED: Last echo 06/16 with EF 65-70%. Taking Coreg 3.125mg  BID and Lasix at home. No cardiac sign or sign of fluid overload. Has reproducible chest pain this morning. - continue coreg - Hold Lasix at this time given hypovolemia  History of EtOH abuse: last drink 03/2014.  - Monitor for signs of  withdrawal. CIWA if symptoms  FEN/GI:  -carb modified diet -IVF as above -Miralax  Prophylaxis: Lovenox  Disposition: regular floor. Discharge today on Lantus and metformin.  Subjective:  Reports neck pain has improved. Able to eat his break fast today. Reports normal BM this morning.   Objective: Temp:  [97.4 F (36.3 C)-99.4 F (37.4 C)] 98.4 F (36.9 C) (04/07 0456) Pulse Rate:  [85-104] 99 (04/07 0456) Resp:  [16-18] 16 (04/07 0456) BP: (126-150)/(88-97) 126/88 mmHg (04/07 0456) SpO2:  [100 %] 100 % (04/07 0456) Physical Exam: Gen: lying in bed.  Oropharynx: moist, no lesion Neck: supple, no LAD, full range of motion, no palpable mass, but tender to palpation CV: regular rate and rythm. S1 & S2 audible, no murmurs. Resp: no apparent work of breathing, clear to auscultation bilaterally. GI: bowel sounds normal, no tenderness to palpation, no rebound or guarding, no mass.  GU: no suprapubic tenderness Skin: no lesion, surgical scare over his left upper arm appears clean MSK: normal bulk Neuro: alert and oriented, no gross deficit Psych: flat affect, poor medical literacy  Laboratory:  Recent Labs Lab 06/21/15 2034  WBC 7.4  HGB 15.7  HCT 44.7  PLT 226    Recent Labs Lab 06/21/15 2034  06/22/15 0834 06/22/15 1719 06/23/15 0505  NA 138  < > 148* 149* 146*  K 4.1  < > 3.4* 3.6 3.5  CL 105  < > 122* 121* 116*  CO2 7*  < > 18* 19* 20*  BUN 15  < > 7 <5* <5*  CREATININE 1.67*  < > 0.93 0.80 0.81  CALCIUM 9.8  < >  9.0 9.1 8.9  PROT 8.2*  --   --   --   --   BILITOT 1.7*  --   --   --   --   ALKPHOS 72  --   --   --   --   ALT 13*  --   --   --   --   AST 11*  --   --   --   --   GLUCOSE 622*  < > 138* 161* 332*  < > = values in this interval not displayed.  Imaging/Diagnostic Tests: No information on file.  Randy Herculesaye T Gonfa, MD 06/24/2015, 8:03 AM PGY-1, [facility] FPTS Intern pager: [phone], text pages welcome

## 2015-06-26 LAB — INSULIN ANTIBODIES, BLOOD

## 2015-06-28 ENCOUNTER — Inpatient Hospital Stay: Payer: Medicaid Other | Admitting: Internal Medicine

## 2015-06-29 ENCOUNTER — Telehealth: Payer: Self-pay | Admitting: Family Medicine

## 2015-06-29 NOTE — Telephone Encounter (Signed)
Selena BattenKim is calling from [facility] and she is wanting verbal orders for the patient to see a Child psychotherapistsocial worker, PT, home health, skilled nursing and any other services that benefit the patient. Please call her at [phone]. Myriam Jacobsonjw

## 2015-06-30 NOTE — Telephone Encounter (Signed)
Verbal orders given for services.  Erasmo DownerAngela M Rylynn Kobs, MD, MPH PGY-2,  [facility] 06/30/2015 12:15 PM

## 2015-07-04 ENCOUNTER — Encounter: Payer: Self-pay | Admitting: Family Medicine

## 2015-07-04 ENCOUNTER — Ambulatory Visit (INDEPENDENT_AMBULATORY_CARE_PROVIDER_SITE_OTHER): Payer: Medicaid Other | Admitting: Family Medicine

## 2015-07-04 VITALS — BP 126/71 | HR 93 | Temp 98.1°F | Ht 69.0 in | Wt 161.8 lb

## 2015-07-04 DIAGNOSIS — H538 Other visual disturbances: Secondary | ICD-10-CM

## 2015-07-04 DIAGNOSIS — R519 Headache, unspecified: Secondary | ICD-10-CM | POA: Insufficient documentation

## 2015-07-04 DIAGNOSIS — R51 Headache: Secondary | ICD-10-CM

## 2015-07-04 DIAGNOSIS — E118 Type 2 diabetes mellitus with unspecified complications: Secondary | ICD-10-CM

## 2015-07-04 DIAGNOSIS — H269 Unspecified cataract: Secondary | ICD-10-CM | POA: Insufficient documentation

## 2015-07-04 DIAGNOSIS — Z794 Long term (current) use of insulin: Secondary | ICD-10-CM | POA: Diagnosis not present

## 2015-07-04 LAB — BASIC METABOLIC PANEL WITH GFR
BUN: 9 mg/dL (ref 7–25)
CALCIUM: 8.5 mg/dL — AB (ref 8.6–10.3)
CO2: 25 mmol/L (ref 20–31)
Chloride: 98 mmol/L (ref 98–110)
Creat: 1.06 mg/dL (ref 0.70–1.25)
GFR, EST AFRICAN AMERICAN: 87 mL/min (ref >=60)
GFR, EST NON AFRICAN AMERICAN: 75 mL/min (ref >=60)
GLUCOSE: 327 mg/dL — AB (ref 65–99)
POTASSIUM: 5.4 mmol/L — AB (ref 3.5–5.3)
SODIUM: 133 mmol/L — AB (ref 135–146)

## 2015-07-04 LAB — POCT SEDIMENTATION RATE: POCT SED RATE: 30 mm/hr — AB (ref 0–22)

## 2015-07-04 MED ORDER — INSULIN GLARGINE 100 UNIT/ML ~~LOC~~ SOLN: 20.0000 [IU] | Freq: Every day | SUBCUTANEOUS | Status: DC

## 2015-07-04 MED ORDER — "INSULIN SYRINGE-NEEDLE U-100 29G X 7/16"" 1 ML MISC": Status: DC

## 2015-07-04 MED ORDER — CYCLOBENZAPRINE HCL 5 MG PO TABS: 5.0000 mg | ORAL_TABLET | Freq: Three times a day (TID) | ORAL | Status: DC | PRN

## 2015-07-04 NOTE — Patient Instructions (Signed)
Take flexeril 5mg  three times daily as needed for neck pain/muscle spasm Use caution as this might make you sleepy  For diabetes, go up to 20 units daily. I sent in more syringes for you.  Checking blood work today Referring you to an eye doctor for the eye issues you've been having.  Follow up with Dr. Beryle FlockBacigalupo in 1 week  Be well, Dr. Pollie MeyerMcIntyre

## 2015-07-04 NOTE — Assessment & Plan Note (Signed)
History somewhat limited by interpretation issues today. Neuro exam is grossly nonfocal. Suspect source of headache is R trapezius muscle spasm. Will treat with flexeril and monitor for improvement. Given report of blurry vision, will also obtain sed rate with labs to decrease suspicion for temporal arteritis (doubt this as no temporal tenderness presently). Follow up with me in 1 week since PCP not available. Discussed need to return here sooner or go to ED if worsening.

## 2015-07-04 NOTE — Assessment & Plan Note (Signed)
My eye exam is normal today. Blood pressure well controlled. Unclear as to the exact chronicity of these complaints. Patient reported they've been present since discharge from the hospital. Uncontrolled CBG's may be contributing.  Needs eye exam for diabetes. Plan: - increasing insulin as above - treating headache with flexeril as above - refer to ophthalmology for evaluation

## 2015-07-04 NOTE — Assessment & Plan Note (Addendum)
CBGs uncontrolled. No lows. Will increase lantus to 20 units daily. Had patient demonstrate this new dose for me on an insulin syringe. Follow up in 1 week. Since PCP is not available in clinic I have him scheduled to see me. He will come with a swahili in-person interpreter to help curtail some of the interpretation issues we faced today. Check BMET today.

## 2015-07-04 NOTE — Progress Notes (Signed)
HPI:  Randy Fisher presents for hospital follow up.   Note: Swahili interpreter was utilized during this visit. Note that we had to use 3 separate phone swahili interpreters as the connection cut off twice. Even with the phone interpreter on the line, there were significant delays in interpretation. Patient did not always answer the question he was asked, even when asked repeatedly by the interpreter. Face to face time with patient nearly 1 hour, though a large portion of this was spent waiting on interpreting. Patient is accompanied by Luanne BrasLizzie, a case Production designer, theatre/television/filmmanager from the [facility] [employer] [place].  Patient was hospitalized from 06/21/15 to 06/24/15 with DKA. Was treated with IV insulin and transitioned to lantus. Discharged on lantus dose 15 units daily as well as metformin 1000mg  twice daily. [facility] to assist with medications at home. Started on aspirin, atorvastatin, and lisinopril while inpatient.  Pt now reports he's been doing better since discharge from the hospital.  Some decreased appetite but is drinking well. He complains of R sided headache and neck pain. Also complains of vision changes in both of his eyes. Headache has been present constantly since discharge from the hospital. Points to his neck as source location of his pain. Reports trouble seeing things that are far away but also has blurriness with close vision as well. Some pain around eyes as well.   Regarding diabetes, has been taking lantus 15 units daily. He is able to show me on a syringe how much insulin he draws up. Checks sugars and typically gets 300s and above. Meter confirms this report. Denies having any low blood sugars.   ROS: See HPI.  PMFSH: diabetes, diastolic CHF, remote ETOH abuse, chronic constipation, hypertension   PHYSICAL EXAM: BP 126/71 mmHg  Pulse 93  Temp(Src) 98.1 F (36.7 C) (Oral)  Ht 5\' 9"  (1.753 m)  Wt 161 lb 12.8 oz (73.392 kg)  BMI 23.88 kg/m2    Visual  Acuity Screening   Right eye Left eye Both eyes  Without correction: 20/100 20/100 20/100  With correction: 20/70 20/100 20/100   Gen: NAD, pleasant, cooperative, well appearing HEENT: normocephalic, atraumatic, moist mucous membranes, oropharynx clear and moist. Pupils equal round and reactive to light. Extraocular movements intact. Face nontender to palpation. No sinus tenderness or temporal tenderness bilaterally. Sclera muddy but no scleral injection. Arcus senilis present bilaterally. Visualized portion of retina appears normal bilaterally, exam limited by lack of mydriasis. Heart: regular rate and rhythm, no murmur Lungs: clear to auscultation bilaterally, normal work of breathing  Abdomen: soft, nontender to palpation, no masses or organomegaly Neuro: alert, grossly nonfocal, speech fluent Ext: No appreciable lower extremity edema bilaterally   ASSESSMENT/PLAN:  T2DM (type 2 diabetes mellitus) (HCC) CBGs uncontrolled. No lows. Will increase lantus to 20 units daily. Had patient demonstrate this new dose for me on an insulin syringe. Follow up in 1 week. Since PCP is not available in clinic I have him scheduled to see me. He will come with a swahili in-person interpreter to help curtail some of the interpretation issues we faced today. Check BMET today.  Headache History somewhat limited by interpretation issues today. Neuro exam is grossly nonfocal. Suspect source of headache is R trapezius muscle spasm. Will treat with flexeril and monitor for improvement. Given report of blurry vision, will also obtain sed rate with labs to decrease suspicion for temporal arteritis (doubt this as no temporal tenderness presently). Follow up with me in 1 week  since PCP not available. Discussed need to return here sooner or go to ED if worsening.  Blurry vision My eye exam is normal today. Blood pressure well controlled. Unclear as to the exact chronicity of these complaints. Patient reported they've  been present since discharge from the hospital. Uncontrolled CBG's may be contributing.  Needs eye exam for diabetes. Plan: - increasing insulin as above - treating headache with flexeril as above - refer to ophthalmology for evaluation   FOLLOW UP: Follow up in 1 week for above issues Referring to ophthalmology  [place] J. Pollie Meyer, MD [facility]   Greater than 25 minutes were spent during this encounter, with at least 50% of the time devoted to counseling and coordination of care.

## 2015-07-06 ENCOUNTER — Telehealth: Payer: Self-pay | Admitting: Family Medicine

## 2015-07-06 NOTE — Telephone Encounter (Signed)
Patient dropped off form to be filled out for social services.  Please fax when completed. °

## 2015-07-08 NOTE — Telephone Encounter (Signed)
Representative of pt comes by to check status of papers. Forms still in folder up front. Advised her it will be taken care of soon.

## 2015-07-08 NOTE — Telephone Encounter (Signed)
Form placed in PCP box 

## 2015-07-08 NOTE — Telephone Encounter (Signed)
Form completed and given to Tamika.  Erasmo DownerAngela M Rainn Zupko, MD, MPH PGY-2,  [facility] 07/08/2015 1:38 PM

## 2015-07-11 NOTE — Telephone Encounter (Signed)
Form faxed to per request and placed up front for pick up.  Clovis PuMartin, Hoy Fallert L, RN

## 2015-07-12 ENCOUNTER — Other Ambulatory Visit: Payer: Self-pay | Admitting: Student

## 2015-07-13 ENCOUNTER — Ambulatory Visit (INDEPENDENT_AMBULATORY_CARE_PROVIDER_SITE_OTHER): Payer: Medicaid Other | Admitting: Family Medicine

## 2015-07-13 ENCOUNTER — Encounter: Payer: Self-pay | Admitting: Family Medicine

## 2015-07-13 VITALS — BP 116/75 | HR 74 | Temp 98.4°F | Wt 165.7 lb

## 2015-07-13 DIAGNOSIS — K5900 Constipation, unspecified: Secondary | ICD-10-CM | POA: Diagnosis not present

## 2015-07-13 DIAGNOSIS — E119 Type 2 diabetes mellitus without complications: Secondary | ICD-10-CM | POA: Diagnosis not present

## 2015-07-13 DIAGNOSIS — I5032 Chronic diastolic (congestive) heart failure: Secondary | ICD-10-CM

## 2015-07-13 DIAGNOSIS — H538 Other visual disturbances: Secondary | ICD-10-CM

## 2015-07-13 MED ORDER — INSULIN GLARGINE 100 UNIT/ML ~~LOC~~ SOLN: 15.0000 [IU] | Freq: Every day | SUBCUTANEOUS | Status: DC

## 2015-07-13 MED ORDER — POLYETHYLENE GLYCOL 3350 17 GM/SCOOP PO POWD: 17.0000 g | Freq: Three times a day (TID) | ORAL | Status: DC

## 2015-07-13 MED ORDER — SENNOSIDES-DOCUSATE SODIUM 8.6-50 MG PO TABS: 1.0000 | ORAL_TABLET | Freq: Two times a day (BID) | ORAL | Status: DC

## 2015-07-13 MED ORDER — DIGITAL GLASS SCALE MISC: Status: DC

## 2015-07-13 NOTE — Patient Instructions (Signed)
Go down to 15 units on lantus. Take in the morning.  Increase miralax to 3 times per day Increase sonokot-S tablet to twice daily  Printed prescription for scale to check weight  Your eye appointment is on   Schedule a follow up visit with Dr. Beryle FlockBacigalupo in the next 1-2 weeks.  Be well, Dr. Pollie MeyerMcIntyre

## 2015-07-13 NOTE — Progress Notes (Signed)
Date of Visit: 07/13/2015   HPI:  Patient presents for follow up. He is accompanied by an in-person Arabic interpreter.  Diabetes - currently taking 20 units of insulin at night. Did not bring medications with him today. Fasting CBG was 66 this morning, 76 yesterday morning. Two days ago it was 104. 66 is the lowest he's gotten. Does not check sugars other times during the day currently. Denies being symptomatic during those low sugars.  Vision issues - at last visit complained of blurry vision and was given appointment with eye doctor but he doesn't remember when it was. Blurry vision has occurred since discharge from the hospital. Vision issues are the same as last week, not worse. Eyes won't focus. Denies flashes of light or dark spots in his vision.  Constipation - currently taking miralax twice daily and senokot-S at night. Has bowel movement about daily or every other day. Stools are not hard but come out small and slow. No blood in stool. He wants his medications changed as he thinks that the constipation medications are not working.  CHF - wants to have a scale at home so he can monitor his weights. Needs rx for this.  ROS: See HPI.  PMFSH: history of type 2 diabetes, CHF, hypertension   PHYSICAL EXAM: BP 116/75 mmHg  Pulse 74  Temp(Src) 98.4 F (36.9 C) (Oral)  Wt 165 lb 11.2 oz (75.161 kg)   Visual Acuity Screening   Right eye Left eye Both eyes  Without correction: 20/100 20/100 20/70  With correction: 20/50 20/50 20/50     Gen: no acute distress, pleasant, cooperative HEENT: normocephalic, atraumatic. Pupils reactive bilaterally. L pupil with slightly oblong shape but still equal in size to R & reactive. Extraocular movements intact. Muddy sclera bilaterally. Heart: regular rate and rhythm, no murmur Lungs: clear to auscultation bilaterally, normal work of breathing  Neuro: alert, grossly nonfocal, speech normal Extremities: No appreciable lower extremity edema  bilaterally  Abdomen: normoactive bowel sounds, soft, nontender to palpation   ASSESSMENT/PLAN:  Constipation Uncontrolled. Increase miralax to three times daily. Increase senokot-S to twice daily. Follow up in a few weeks with PCP.  CHF (congestive heart failure) (HCC) Given printed rx for scale so he can measure weights at home. Volume status looks good today. Did not discuss CHF in greater detail.  T2DM (type 2 diabetes mellitus) (HCC) Having low fasting sugars, fortunately though is not symptomatic during these times. Will decrease lantus back to 15 units daily and have him move dosing to morning time. Follow up with PCP in 2 weeks to continue adjusting insulin.  Blurry vision Unchanged from prior per patient report. Visual acuity is actually improved today from last reading last week. No black spots or bright flashes lower concern for acute opthalmologic emergency.  We called ophtho office and gave him another card with his appointment on it. Follow up with ophthalmology as scheduled.    FOLLOW UP: Follow up in 1-2 weeks with PCP for above issues.  [place]adaBrittany J. Pollie MeyerMcIntyre, MD [facility]

## 2015-07-16 NOTE — Assessment & Plan Note (Signed)
Having low fasting sugars, fortunately though is not symptomatic during these times. Will decrease lantus back to 15 units daily and have him move dosing to morning time. Follow up with PCP in 2 weeks to continue adjusting insulin.

## 2015-07-16 NOTE — Assessment & Plan Note (Signed)
Uncontrolled. Increase miralax to three times daily. Increase senokot-S to twice daily. Follow up in a few weeks with PCP.

## 2015-07-16 NOTE — Assessment & Plan Note (Signed)
Unchanged from prior per patient report. Visual acuity is actually improved today from last reading last week. No black spots or bright flashes lower concern for acute opthalmologic emergency.  We called ophtho office and gave him another card with his appointment on it. Follow up with ophthalmology as scheduled.

## 2015-07-16 NOTE — Assessment & Plan Note (Signed)
Given printed rx for scale so he can measure weights at home. Volume status looks good today. Did not discuss CHF in greater detail.

## 2015-07-19 ENCOUNTER — Encounter: Payer: Self-pay | Admitting: *Deleted

## 2015-07-19 ENCOUNTER — Telehealth: Payer: Self-pay | Admitting: *Deleted

## 2015-07-19 DIAGNOSIS — Z139 Encounter for screening, unspecified: Secondary | ICD-10-CM

## 2015-07-19 NOTE — Telephone Encounter (Signed)
Waynetta SandyBeth, RN from [facility] requesting for home visits for medication teaching.  Please call [phone].  Clovis PuMartin, Kianni Lheureux L, RN

## 2015-07-19 NOTE — Congregational Nurse Program (Signed)
Congregational Nurse Program Note  Date of Encounter: 07/19/2015  Past Medical History: Past Medical History  Diagnosis Date  . Alcohol abuse   . Diabetes mellitus without complication [facility](HCC)     Encounter Details:     CNP Questionnaire - 07/19/15 1558    Patient Demographics   Is this a new or existing patient? New   Patient is considered a/an Refugee   Race African   Patient Assistance   Location of Patient Assistance [place]   Patient's financial/insurance status Medicaid   Uninsured Patient No   Patient referred to apply for the following financial assistance Not Applicable   Food insecurities addressed Not Applicable   Transportation assistance No   Assistance securing medications No   Educational health offerings Other   Encounter Details   Primary purpose of visit Other   Was an Emergency Department visit averted? Not Applicable   Does patient have a medical provider? Yes   Patient referred to Not Applicable   Was a mental health screening completed? (GAINS tool) No   Does patient have dental issues? No   Does patient have vision issues? No   Since previous encounter, have you referred patient for abnormal blood pressure that resulted in a new diagnosis or medication change? No   Since previous encounter, have you referred patient for abnormal blood glucose that resulted in a new diagnosis or medication change? No         Amb Nursing Assessment - 07/13/15 1038    Patient Literacy   What is the last grade level you completed in school? 3rd grade       Client at center today. Looks very well and happy. [facility] are seeing him and taking care of his needs. He only wanted help in transferring his name on the light bill and chantel assisted him in that. [employer] CorporationHelena Kelleen Stolze RN BSN CN [phone].

## 2015-07-21 NOTE — Telephone Encounter (Signed)
[facility] from [facility] at [facility] [phone] for verbal orders to extend home visits for two more weeks. States last visit is today and will need discharge orders if visits aren't extended. Note routed to PCP

## 2015-07-21 NOTE — Telephone Encounter (Signed)
Called and left VM for Beth approving 2 more weeks of home visits.  Erasmo DownerAngela M Bacigalupo, MD, MPH PGY-2,  [facility] 07/21/2015 6:49 PM

## 2015-08-01 ENCOUNTER — Ambulatory Visit (INDEPENDENT_AMBULATORY_CARE_PROVIDER_SITE_OTHER): Payer: Medicaid Other | Admitting: Family Medicine

## 2015-08-01 ENCOUNTER — Encounter: Payer: Self-pay | Admitting: Family Medicine

## 2015-08-01 VITALS — BP 113/83 | HR 75 | Temp 98.1°F | Ht 69.0 in | Wt 167.2 lb

## 2015-08-01 DIAGNOSIS — E119 Type 2 diabetes mellitus without complications: Secondary | ICD-10-CM

## 2015-08-01 DIAGNOSIS — K5900 Constipation, unspecified: Secondary | ICD-10-CM

## 2015-08-01 DIAGNOSIS — H538 Other visual disturbances: Secondary | ICD-10-CM | POA: Diagnosis not present

## 2015-08-01 DIAGNOSIS — Z789 Other specified health status: Secondary | ICD-10-CM

## 2015-08-01 MED ORDER — POLYETHYLENE GLYCOL 3350 17 GM/SCOOP PO POWD: 17.0000 g | Freq: Three times a day (TID) | ORAL | Status: DC

## 2015-08-01 NOTE — Assessment & Plan Note (Signed)
Reportedly well controlled fasting CBGs Continue metformin and Lantus 15u daily Does not have medications with him today, so unable to instruct on use F/u in 1 month A1c and foot exam at that time

## 2015-08-01 NOTE — Assessment & Plan Note (Signed)
Increase miralax to TID Continue senokot-s BID F/u prn

## 2015-08-01 NOTE — Patient Instructions (Signed)
Patient instructions given verbally.

## 2015-08-01 NOTE — Assessment & Plan Note (Signed)
Not changing No red flags Made another appt with optho Stressed importance of going to appt

## 2015-08-01 NOTE — Progress Notes (Signed)
   Subjective:   Osborne Omanorozi Surber is a 60 y.o. male with a history of dCHF, T2DM, HTN here for T2DM f/u. In person Swahili interpreter  T2DM - Checking BG at home: yes, fasting in 80s - Medications: metformin 1000mg  BID, Lantus 15 units qAM - Compliance: good - Diet: eating chicken and fish only, because he thought he wasn't supposed to eat anything else - Exercise: not yet - eye exam: scheduled for 5/9 but missed optho - foot exam: needs - microalbumin: n/a - denies symptoms of hypoglycemia, polyuria, polydipsia  Blurry vision - has not changed  - missed appt on 5/9  With optho - R>L eye  Constipation - taking miralax BID and senokot-S BID - continues to have difficulty - no BRBPR or melena   Review of Systems:  Per HPI.   Social History: never smoker  Objective:  BP 113/83 mmHg  Pulse 75  Temp(Src) 98.1 F (36.7 C) (Oral)  Ht 5\' 9"  (1.753 m)  Wt 167 lb 3.2 oz (75.841 kg)  BMI 24.68 kg/m2  SpO2 99%  Gen:  60 y.o. male in NAD HEENT: NCAT, MMM, EOMI, PERRL, anicteric sclerae CV: RRR, no MRG Resp: Non-labored, CTAB, no wheezes noted Abd: Soft, NTND, BS present, no guarding or organomegaly Ext: WWP, no edema MSK: No obvious deformities Neuro: Alert and oriented, speech normal      Chemistry      Component Value Date/Time   NA 133* 07/04/2015 1142   K 5.4* 07/04/2015 1142   CL 98 07/04/2015 1142   CO2 25 07/04/2015 1142   BUN 9 07/04/2015 1142   CREATININE 1.06 07/04/2015 1142   CREATININE 0.72 06/24/2015 0938      Component Value Date/Time   CALCIUM 8.5* 07/04/2015 1142   ALKPHOS 72 06/21/2015 2034   AST 11* 06/21/2015 2034   ALT 13* 06/21/2015 2034   BILITOT 1.7* 06/21/2015 2034      Lab Results  Component Value Date   WBC 7.4 06/21/2015   HGB 15.7 06/21/2015   HCT 44.7 06/21/2015   MCV 78.3 06/21/2015   PLT 226 06/21/2015   Lab Results  Component Value Date   TSH 0.925 06/22/2015   Lab Results  Component Value Date   HGBA1C 13.0  06/03/2015   Assessment & Plan:     Osborne Omanorozi Doster is a 60 y.o. male here for f/u of :  T2DM (type 2 diabetes mellitus) (HCC) Reportedly well controlled fasting CBGs Continue metformin and Lantus 15u daily Does not have medications with him today, so unable to instruct on use F/u in 1 month A1c and foot exam at that time  Constipation Increase miralax to TID Continue senokot-s BID F/u prn  Blurry vision Not changing No red flags Made another appt with optho Stressed importance of going to appt     Erasmo DownerAngela M Bacigalupo, MD MPH PGY-2,  [facility] 08/01/2015  10:32 AM

## 2015-08-03 ENCOUNTER — Telehealth: Payer: Self-pay | Admitting: *Deleted

## 2015-08-03 NOTE — Telephone Encounter (Signed)
Beth, RN with [facility] called to request verbal orders to continue nursing care and social work consult.  Please give her a call at [phone].  Clovis PuMartin, Tamika L, RN

## 2015-08-04 NOTE — Telephone Encounter (Signed)
Left message giving verbal orders for RN and SW services.  Erasmo DownerAngela M Shaniyah Wix, MD, MPH PGY-2,  [facility] 08/04/2015 8:41 AM

## 2015-08-09 LAB — GLUCOSE, POCT (MANUAL RESULT ENTRY): POC GLUCOSE: 109 mg/dL — AB (ref 70–99)

## 2015-08-10 ENCOUNTER — Encounter: Payer: Self-pay | Admitting: *Deleted

## 2015-08-10 DIAGNOSIS — Z139 Encounter for screening, unspecified: Secondary | ICD-10-CM

## 2015-08-10 NOTE — Congregational Nurse Program (Signed)
Congregational Nurse Program Note  Date of Encounter: 08/10/2015  Past Medical History: Past Medical History  Diagnosis Date  . Alcohol abuse   . Diabetes mellitus without complication [facility](HCC)     Encounter Details:     CNP Questionnaire - 08/09/15 1300    Patient Demographics   Is this a new or existing patient? Existing   Patient is considered a/an Refugee   Race African   Patient Assistance   Location of Patient Assistance [place]   Patient's financial/insurance status Medicaid   Uninsured Patient No   Patient referred to apply for the following financial assistance Not Applicable   Food insecurities addressed Not Applicable   Transportation assistance No   Assistance securing medications No   Educational health offerings Medications   Encounter Details   Primary purpose of visit Safety   Was an Emergency Department visit averted? Not Applicable   Does patient have a medical provider? Yes   Patient referred to Other (comment)   Was a mental health screening completed? (GAINS tool) No   Does patient have dental issues? No   Does patient have vision issues? No   Does your patient have an abnormal blood pressure today? No   Since previous encounter, have you referred patient for abnormal blood pressure that resulted in a new diagnosis or medication change? No   Does your patient have an abnormal blood glucose today? No   Since previous encounter, have you referred patient for abnormal blood glucose that resulted in a new diagnosis or medication change? No   Was there a life-saving intervention made? No         Amb Nursing Assessment - 08/01/15 0850    Pre-visit preparation   Pre-visit preparation completed Yes   Abuse/Neglect Assessment   Do you feel unsafe in your current relationship? No   Do you feel physically threatened by others? No   Anyone hurting you at home, work, or school? No   Unable to ask? No   Patient Literacy   How often do you need to have  someone help you when you read instructions, pamphlets, or other written materials from your doctor or pharmacy? 5 - Always   What is the last grade level you completed in school? 3 years of school total    Language Assistant   Interpreter Needed? Yes   Interpreter Agency [employer]    Interpreter Name Collins ScotlandJosias gasitha       Client only wanted assistance with medications. Also he has been referred to [facility] for further services with [facility] .They  Have a meeting this week. He really looks good and is improving each day/client. Other assistance was with Ladona Ridgelaylor concerning his living quarters.Fluor CorporationHelena Leeandre Nordling RN CN [phone]

## 2015-08-26 ENCOUNTER — Ambulatory Visit (INDEPENDENT_AMBULATORY_CARE_PROVIDER_SITE_OTHER): Payer: Medicaid Other | Admitting: Family Medicine

## 2015-08-26 VITALS — BP 127/72 | HR 76 | Temp 98.6°F | Ht 69.0 in | Wt 169.0 lb

## 2015-08-26 DIAGNOSIS — E119 Type 2 diabetes mellitus without complications: Secondary | ICD-10-CM | POA: Diagnosis not present

## 2015-08-26 DIAGNOSIS — R1032 Left lower quadrant pain: Secondary | ICD-10-CM

## 2015-08-26 DIAGNOSIS — I10 Essential (primary) hypertension: Secondary | ICD-10-CM

## 2015-08-26 DIAGNOSIS — K5900 Constipation, unspecified: Secondary | ICD-10-CM | POA: Diagnosis not present

## 2015-08-26 LAB — BASIC METABOLIC PANEL WITH GFR
BUN: 16 mg/dL (ref 7–25)
CALCIUM: 8.7 mg/dL (ref 8.6–10.3)
CO2: 24 mmol/L (ref 20–31)
CREATININE: 0.91 mg/dL (ref 0.70–1.25)
Chloride: 105 mmol/L (ref 98–110)
GLUCOSE: 163 mg/dL — AB (ref 65–99)
POTASSIUM: 4.7 mmol/L (ref 3.5–5.3)
SODIUM: 137 mmol/L (ref 135–146)

## 2015-08-26 LAB — POCT GLYCOSYLATED HEMOGLOBIN (HGB A1C): HEMOGLOBIN A1C: 8

## 2015-08-26 NOTE — Assessment & Plan Note (Signed)
Abdominal exam benign and no red flags Likely related to hunger pains and constipation given history patient appears to have gastroenterology appointment next week Follow-up as needed

## 2015-08-26 NOTE — Assessment & Plan Note (Signed)
Well-controlled Continue current medications BMP today Follow-up in 3 months 

## 2015-08-26 NOTE — Progress Notes (Signed)
   Subjective:   Randy Fisher is a 60 y.o. male with a history of HTN, dCHF, T2DM with DKA here for diabetes follow-up  T2DM - Checking BG at home: yes, fasting - 96 today, usually 90-110 - Medications: metformin 1000mg  BID, Lantus 15 units daily - Compliance: good - Diet: only eating corn fufu (an african meal stable with carbs) with vegetables and chicken/fish - Exercise: reports he is biking everyday - eye exam: saw optho within last month and told that he needs surgery - foot exam: needs today - microalbumin: n/a ACEi - denies symptoms of hypoglycemia, polyuria, polydipsia  HTN: - Medications: lisinopril 5mg  daily, lasix 20mg  daily - Compliance: reports good - supposed to be taking coreg 3.125 BID, but not sure if he is - Checking BP at home: no - Denies any SOB, CP, LE edema, medication SEs, or symptoms of hypotension  Constipation - taking miralax BID and senokot-S BID - continues to have difficulty passing stool intermittently - reports BM daily - no BRBPR or melena - reports abd discomfort (not pain) that comes when feeling hungry - better with eating - no epigastric burning  Also complains of R foot back, back pain - advised f/u within next few weeks  Review of Systems:  Per HPI.   Social History: never smoker  Objective:  BP 127/72 mmHg  Pulse 76  Temp(Src) 98.6 F (37 C) (Oral)  Ht 5\' 9"  (1.753 m)  Wt 169 lb (76.658 kg)  BMI 24.95 kg/m2  Gen:  60 y.o. male in NAD HEENT: NCAT, MMM, EOMI, PERRL, anicteric sclerae CV: RRR, no MRG Resp: Non-labored, CTAB, no wheezes noted Abd: Soft, NTND, BS present, no rebound/guarding or organomegaly Ext: WWP, no edema MSK: no obvious deformities Neuro: Alert and oriented, speech normal     Diabetic Foot Check -  Appearance - no lesions, ulcers. + calluses on b/l great toes, L 4th toe Skin - no unusual pallor or redness Monofilament testing - patient unable to understand. Gross sensation intact   Lab Results    Component Value Date   HGBA1C 8.0 08/26/2015   Assessment & Plan:     Randy Fisher is a 60 y.o. male here for  T2DM (type 2 diabetes mellitus) (HCC) A1c 8.0 (down from 13 3 months ago) Will liekly see continue to trend down Continue current medications Foot exam completed today Recently saw ophthalmology, so up-to-date on eye exam Follow-up in 3 months  Constipation Improving Continue Mira lax and senna Follow-up as needed  HTN (hypertension) Well-controlled Continue current medications BMP today Follow-up in 3 months  Abdominal pain Abdominal exam benign and no red flags Likely related to hunger pains and constipation given history patient appears to have gastroenterology appointment next week Follow-up as needed     Erasmo DownerAngela M Rourke Mcquitty, MD MPH PGY-2,  [facility] 08/26/2015  11:49 AM

## 2015-08-26 NOTE — Assessment & Plan Note (Signed)
A1c 8.0 (down from 13 3 months ago) Will liekly see continue to trend down Continue current medications Foot exam completed today Recently saw ophthalmology, so up-to-date on eye exam Follow-up in 3 months

## 2015-08-26 NOTE — Assessment & Plan Note (Signed)
Improving Continue Mira lax and senna Follow-up as needed

## 2015-08-27 ENCOUNTER — Other Ambulatory Visit: Payer: Self-pay | Admitting: Student

## 2015-09-01 ENCOUNTER — Ambulatory Visit (INDEPENDENT_AMBULATORY_CARE_PROVIDER_SITE_OTHER): Payer: Medicaid Other | Admitting: Internal Medicine

## 2015-09-01 ENCOUNTER — Encounter: Payer: Self-pay | Admitting: Internal Medicine

## 2015-09-01 VITALS — BP 98/70 | HR 68 | Ht 65.0 in | Wt 172.0 lb

## 2015-09-01 DIAGNOSIS — K625 Hemorrhage of anus and rectum: Secondary | ICD-10-CM | POA: Diagnosis not present

## 2015-09-01 DIAGNOSIS — R194 Change in bowel habit: Secondary | ICD-10-CM | POA: Diagnosis not present

## 2015-09-01 NOTE — Progress Notes (Signed)
Referred by [name], MD [address] [place], [place] [address]  Subjective:    Patient ID: Randy Fisher, male    DOB: 12/19/1955, 60 y.o.   MRN: [id number] Cc; worsening constipation HPI Middle-aged man from [place] here w/ Swahili interpreter - has 4 mos worsening constipation better but not completely relieved w/ several doses Miralax qd. Says weight is down. No med changes, diet changes.  Some rare slight bright red recta; bleeding. Wt Readings from Last 3 Encounters:  09/01/15 172 lb (78.019 kg)  08/26/15 169 lb (76.658 kg)  08/01/15 167 lb 3.2 oz (75.841 kg)   All other GI ROS neg No Known Allergies Outpatient Prescriptions Prior to Visit  Medication Sig Dispense Refill  . ACCU-CHEK SOFTCLIX LANCETS lancets Use to check your blood sugar in the morning before breakfast, 2 hours after lunch and dinner 100 each 12  . aspirin EC 81 MG EC tablet Take 1 tablet (81 mg total) by mouth daily. 30 tablet 11  . atorvastatin (LIPITOR) 40 MG tablet Take 1 tablet (40 mg total) by mouth daily at 6 PM. 30 tablet 3  . Blood Glucose Monitoring Suppl (ACCU-CHEK AVIVA) device Use as instructed 1 each 0  . carvedilol (COREG) 3.125 MG tablet Take 3.125 mg by mouth 2 (two) times daily.  3  . cyclobenzaprine (FLEXERIL) 5 MG tablet Take 1 tablet (5 mg total) by mouth 3 (three) times daily as needed for muscle spasms. 30 tablet 0  . furosemide (LASIX) 20 MG tablet TAKE 1 TABLET BY MOUTH DAILY 30 tablet 5  . gabapentin (NEURONTIN) 100 MG capsule TAKE ONE CAPSULE BY MOUTH THREE TIMES DAILY 90 capsule 5  . glucose blood (ACCU-CHEK AVIVA) test strip Use to check your blood sugar in the morning before breakfast, 2 hours after lunch and dinner 100 each 12  . insulin glargine (LANTUS) 100 UNIT/ML injection Inject 0.15 mLs (15 Units total) into the skin daily after breakfast. 10 mL   . Insulin Syringe-Needle U-100 29G X 7/16" 1 ML MISC Use to inject insulin once daily 30 each 11  . lactobacillus  acidophilus & bulgar (LACTINEX) chewable tablet Chew 1 tablet by mouth 3 (three) times daily with meals. Reported on 08/26/2015    . Lancet Devices (AUTOLET LANCING DEVICE) MISC use to check blood sugar in the morning before breakfast, 2 hours after lunch and dinner 1 each 0  . lisinopril (PRINIVIL,ZESTRIL) 5 MG tablet take half a TABLET BY MOUTH EVERY DAY 15 tablet 6  . metFORMIN (GLUCOPHAGE) 1000 MG tablet Take 1 tablet (1,000 mg total) by mouth 2 (two) times daily with a meal. 60 tablet 3  . Misc. Devices (DIGITAL GLASS SCALE) MISC Check weight daily 1 each 0  . naproxen (NAPROSYN) 500 MG tablet Take 500 mg by mouth 2 (two) times daily.  3  . polyethylene glycol powder (GLYCOLAX/MIRALAX) powder Take 17 g by mouth 3 (three) times daily. 850 g 6  . Probiotic Product (PROBIOTIC DAILY PO) Take 1 tablet by mouth 2 (two) times daily.    Marland Kitchen senna-docusate (SENOKOT-S) 8.6-50 MG tablet Take 1 tablet by mouth 2 (two) times daily. 60 tablet 2   No facility-administered medications prior to visit.   Past Medical History  Diagnosis Date  . Alcohol abuse   . Diabetes mellitus without complication [facility])    Past Surgical History  Procedure Laterality Date  . 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  Marital Status: Single    Spouse Name: N/A  . Number of Children: 10  . Years of Education: N/A   Occupational History  . disabled    Social History Main Topics  . Smoking status: Never Smoker   . Smokeless tobacco: None  . Alcohol Use: No  . Drug Use: None  . Sexual Activity: Not Asked   Other Topics Concern  . None   Social History Narrative   [place] since about 2006   7 sons 3 daughters some in [place] some in [place]       Family History  Problem Relation Age of Onset  . Family history unknown: Yes   Language barrier makes FHx difficult to obtain - he seems unaware of family hx - do not think any colon cancer     Review of  Systems All other negative, does have DM w/ Hgb A1C 8    Objective:   Physical Exam @BP  98/70 mmHg  Pulse 68  Ht 5\' 5"  (1.651 m)  Wt 172 lb (78.019 kg)  BMI 28.62 kg/m2@  General:  Well-developed, well-nourished and in no acute distress Eyes:  anicteric. ENT:   Mouth and posterior pharynx free of lesions.  Neck:   supple w/o thyromegaly or mass.  Lungs: Clear to auscultation bilaterally. Heart:  S1S2, no rubs, murmurs, gallops. Abdomen:  soft, non-tender, no hepatosplenomegaly, hernia, or mass and BS+.  Rectal: NL tone, no mass, nontedner Lymph:  no cervical or supraclavicular adenopathy. Extremities:   Trace ankle edema, cyanosis or clubbing Skin   no rash. Neuro:  A&O x 3.  Psych:  appropriate mood and  Affect.   Data Reviewed: Lab Results  Component Value Date   WBC 7.4 06/21/2015   HGB 15.7 06/21/2015   HCT 44.7 06/21/2015   MCV 78.3 06/21/2015   PLT 226 06/21/2015   Lab Results  Component Value Date   TSH 0.925 06/22/2015   PCP notes       Assessment & Plan:   Encounter Diagnoses  Name Primary?  . Change in bowel habits Yes  . Rectal bleeding    Schedule colonoscopy to evaluate. The risks and benefits as well as alternatives of endoscopic procedure(s) have been discussed and reviewed. All questions answered. The patient agrees to proceed.  WU:[id number]:Angela Beryle FlockBacigalupo, MD

## 2015-09-01 NOTE — Patient Instructions (Signed)
  You have been scheduled for a colonoscopy. Please follow written instructions given to you at your visit today.  Please pick up your prep supplies at the pharmacy. If you use inhalers (even only as needed), please bring them with you on the day of your procedure.    HOLD YOUR DIABETIC MEDICINES THE MORNING OF YOUR PROCEDURE.  Please get your nephew to help you read over the instructions.    I appreciate the opportunity to care for you. Stan Headarl Gessner, MD, [facility]

## 2015-09-02 ENCOUNTER — Encounter: Payer: Self-pay | Admitting: Internal Medicine

## 2015-09-02 ENCOUNTER — Ambulatory Visit: Payer: Medicaid Other | Admitting: Family Medicine

## 2015-09-06 ENCOUNTER — Other Ambulatory Visit: Payer: Self-pay | Admitting: Student

## 2015-09-13 ENCOUNTER — Encounter: Payer: Self-pay | Admitting: Family Medicine

## 2015-09-13 ENCOUNTER — Ambulatory Visit (INDEPENDENT_AMBULATORY_CARE_PROVIDER_SITE_OTHER): Payer: Medicaid Other | Admitting: Family Medicine

## 2015-09-13 VITALS — BP 123/85 | HR 72 | Temp 98.2°F | Ht 65.0 in | Wt 167.4 lb

## 2015-09-13 DIAGNOSIS — E119 Type 2 diabetes mellitus without complications: Secondary | ICD-10-CM

## 2015-09-13 DIAGNOSIS — I10 Essential (primary) hypertension: Secondary | ICD-10-CM | POA: Diagnosis not present

## 2015-09-13 DIAGNOSIS — I5032 Chronic diastolic (congestive) heart failure: Secondary | ICD-10-CM

## 2015-09-13 DIAGNOSIS — Z7189 Other specified counseling: Secondary | ICD-10-CM | POA: Insufficient documentation

## 2015-09-13 NOTE — Assessment & Plan Note (Signed)
Discussed the control is improving after hospitalization for DKA Continue current Lantus dosing Advised caseworker that patient would benefit from a sharps disposal container for his needles Advised patient to eat his breakfast if he is feeling shaky in the morning, but fasting blood glucoses are well controlled

## 2015-09-13 NOTE — Assessment & Plan Note (Signed)
Discussed with patient and caseworker that he is not appropriate for palliative care referral Advised patient on the status of his chronic medical conditions Advised him that he does not have ESRD and that his heart failure is very mild Advised him to continue taking his current medications and following up with his ophthalmologist for cataract surgery and a gastroenterologist for his colonoscopy

## 2015-09-13 NOTE — Assessment & Plan Note (Signed)
Discussed mild severity with patient and caseworker Explained the difference between systolic and diastolic dysfunction

## 2015-09-13 NOTE — Assessment & Plan Note (Signed)
Discussed this is well controlled on current medications and he should continue these medications

## 2015-09-13 NOTE — Progress Notes (Signed)
   Subjective:   Randy Fisher is a 60 y.o. male with a history of Grade 1 diastolic CHF, T2 DM, HTN here for possible referral to palliative care  Interviews conducted with an person Swahili interpreter  Case worker intern present during visit. She reports that home health nurse has requested palliative care referral for the patient. She reports that he has very bad CHF and ESRD and therefore would benefit from palliative care services. She also reports that he has many bad medical conditions and does not understand the severity of them.  Discussed with patient his diabetes that is getting under better control. He reports that he is taking insulin regularly checking his blood sugars every morning. He reports rare episodes of shaking and sweating when his blood sugar is in the 80s in the morning. This is better after eating.  Also discussed as well controlled hypertension and very mild diastolic heart failure.  Patient reports he would also like to talk about back pain and leg pain. Discussed that we would have follow-up appointment to discuss these issues.  Review of Systems:  Per HPI.   Social History: never smoker  Objective:  BP 123/85 mmHg  Pulse 72  Temp(Src) 98.2 F (36.8 C) (Oral)  Ht 5\' 5"  (1.651 m)  Wt 167 lb 6.4 oz (75.932 kg)  BMI 27.86 kg/m2  Gen:  60 y.o. male in NAD, sitting comfortably HEENT: NCAT, MMM, EOMI, PERRL, anicteric sclerae CV: RRR, no MRG Resp: Non-labored, CTAB, no wheezes noted Ext: WWP, no edema MSK: No obvious deformities Neuro: Alert and oriented, speech normal    Assessment & Plan:     Randy Fisher is a 60 y.o. male here for   CHF (congestive heart failure) (HCC) Discussed mild severity with patient and caseworker Explained the difference between systolic and diastolic dysfunction  HTN (hypertension) Discussed this is well controlled on current medications and he should continue these medications  T2DM (type 2 diabetes mellitus)  (HCC) Discussed the control is improving after hospitalization for DKA Continue current Lantus dosing Advised caseworker that patient would benefit from a sharps disposal container for his needles Advised patient to eat his breakfast if he is feeling shaky in the morning, but fasting blood glucoses are well controlled  Goals of care, counseling/discussion Discussed with patient and caseworker that he is not appropriate for palliative care referral Advised patient on the status of his chronic medical conditions Advised him that he does not have ESRD and that his heart failure is very mild Advised him to continue taking his current medications and following up with his ophthalmologist for cataract surgery and a gastroenterologist for his colonoscopy       Erasmo DownerAngela M Ruey Storer, MD MPH PGY-2,  [facility] 09/13/2015  4:56 PM

## 2015-09-19 ENCOUNTER — Ambulatory Visit (INDEPENDENT_AMBULATORY_CARE_PROVIDER_SITE_OTHER): Payer: Medicaid Other | Admitting: Family Medicine

## 2015-09-19 VITALS — BP 125/75 | HR 79 | Temp 98.6°F | Ht 65.0 in | Wt 170.0 lb

## 2015-09-19 DIAGNOSIS — M545 Low back pain, unspecified: Secondary | ICD-10-CM

## 2015-09-19 DIAGNOSIS — M6788 Other specified disorders of synovium and tendon, other site: Secondary | ICD-10-CM | POA: Insufficient documentation

## 2015-09-19 DIAGNOSIS — M7661 Achilles tendinitis, right leg: Secondary | ICD-10-CM | POA: Diagnosis not present

## 2015-09-19 DIAGNOSIS — M7662 Achilles tendinitis, left leg: Secondary | ICD-10-CM

## 2015-09-19 MED ORDER — NAPROXEN 500 MG PO TABS: 500.0000 mg | ORAL_TABLET | Freq: Two times a day (BID) | ORAL | Status: DC

## 2015-09-19 MED ORDER — GABAPENTIN 300 MG PO CAPS: 300.0000 mg | ORAL_CAPSULE | Freq: Three times a day (TID) | ORAL | Status: DC

## 2015-09-19 NOTE — Assessment & Plan Note (Signed)
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

## 2015-09-19 NOTE — Progress Notes (Signed)
   Subjective:   Osborne Omanorozi Suniga is a 60 y.o. male with a history of T2 DM with peripheral neuropathy, hypertension here for leg and back pain  B/l leg pain - present for ~1 month - but patient has discussed this with me at previous visits up to 1 year ago - gradually getting worse - difficulty walking - hurts - no OTC meds - no longer taking naproxen - hurts on posterior heel - pulling sensation  Back pain - present for ~1 month - the patient has discussed this with me at previous visits up to 1 year ago - middle of low back - stabbing pain - constant - leg pain makes it worse in back because he walks strangely - worse with sitting and walking - taking gabapentin 100mg  TID - tingling in feet - present for a long time  Review of Systems:  Per HPI.   Social History: never smoker  Objective:  BP 125/75 mmHg  Pulse 79  Temp(Src) 98.6 F (37 C) (Oral)  Ht 5\' 5"  (1.651 m)  Wt 170 lb (77.111 kg)  BMI 28.29 kg/m2  Gen:  60 y.o. male in NAD HEENT: NCAT, MMM CV: RRR, no MRG Resp: Non-labored, CTAB, no wheezes noted Ext: WWP, trace edema MSK: Back: TTP over every spinous process, worse in L spine, also TTP diffusely over paraspinal musculature.  Hip ROM intact passively.  Ankles: TTP over b/l achilles tendons, no haglans deformity.  Ankle dorsiflexion limited b/l.  Strength intact, sensation grossly intact. Neuro: Alert and oriented, speech normal     Assessment & Plan:     Osborne Omanorozi Brion is a 60 y.o. male here for  Back pain Most consistent with musculoskeletal back pain Previous MRI of T and L-spine in 2016 with multilevel facet changes and previous remote compression fractures of L4 and L5 No evidence of neurologic involvement of lower extremities on exam, but exam is difficult due to patient comprehension Increase gabapentin to 300 mg 3 times a day Advised naproxen twice a day Physical therapy referral Referral to sports medicine  Achilles tendinosis Exam consistent  with a chronic Achilles tendinopathy bilaterally Patient would benefit from heel lifts but not available in clinic today Advised naproxen twice a day Physical therapy referral Sports medicine referral for heel lifts and ultrasonography of the area       Erasmo DownerAngela M Deontre Allsup, MD MPH PGY-3,  [facility] 09/19/2015  12:37 PM

## 2015-09-19 NOTE — Patient Instructions (Signed)
Increase gabapentin to 300mg  TID  Start Naproxen 500mg  BID  Start physical therapy - someone will call with appointment

## 2015-09-19 NOTE — Assessment & Plan Note (Addendum)
Most consistent with musculoskeletal back pain Previous MRI of T and L-spine in 2016 with multilevel facet changes and previous remote compression fractures of L4 and L5 No evidence of neurologic involvement of lower extremities on exam, but exam is difficult due to patient comprehension Increase gabapentin to 300 mg 3 times a day Advised naproxen twice a day Physical therapy referral Referral to sports medicine

## 2015-09-22 HISTORY — PX: CATARACT EXTRACTION: SUR2

## 2015-09-26 ENCOUNTER — Ambulatory Visit: Payer: Medicaid Other | Admitting: Family Medicine

## 2015-09-27 ENCOUNTER — Telehealth: Payer: Self-pay | Admitting: Internal Medicine

## 2015-09-27 NOTE — Telephone Encounter (Signed)
The patient requires an interpretor due to the fact that he is swahili.  The patient took his miralax yesterday instead of today.  She also said that the patient ate today.  I told her to have him get more miralax and gatorade today and mix it.   Take 1/2 at 5pm today and half 5 hours before his procedure.  Also stressed that he has to be on clear liquids only today.  She said that he did take the dulcolax today.   She said that he has had a tough time understanding what to do. She will explain it all again.  I told her to call us if he has any other issues. Interpretor is aware that the miralax is available over the counter.

## 2015-09-28 ENCOUNTER — Encounter: Payer: Self-pay | Admitting: Internal Medicine

## 2015-09-28 ENCOUNTER — Encounter: Payer: Medicaid Other | Admitting: Internal Medicine

## 2015-09-28 ENCOUNTER — Ambulatory Visit (AMBULATORY_SURGERY_CENTER): Payer: Medicaid Other | Admitting: Internal Medicine

## 2015-09-28 VITALS — BP 110/73 | HR 61 | Temp 98.6°F | Resp 14 | Ht 65.0 in | Wt 170.0 lb

## 2015-09-28 DIAGNOSIS — K625 Hemorrhage of anus and rectum: Secondary | ICD-10-CM

## 2015-09-28 DIAGNOSIS — R194 Change in bowel habit: Secondary | ICD-10-CM

## 2015-09-28 DIAGNOSIS — K648 Other hemorrhoids: Secondary | ICD-10-CM

## 2015-09-28 LAB — GLUCOSE, CAPILLARY
GLUCOSE-CAPILLARY: 80 mg/dL (ref 65–99)
GLUCOSE-CAPILLARY: 64 mg/dL — AB (ref 65–99)
Glucose-Capillary: 72 mg/dL (ref 65–99)
Glucose-Capillary: 150 mg/dL — ABNORMAL HIGH (ref 65–99)

## 2015-09-28 MED ORDER — SODIUM CHLORIDE 0.9 % IV SOLN: 500.0000 mL | INTRAVENOUS | Status: DC

## 2015-09-28 MED ORDER — DEXTROSE 5 % IV SOLN: INTRAVENOUS | Status: DC

## 2015-09-28 NOTE — Progress Notes (Signed)
Intrepreter, case Production designer, theatre/television/filmmanager and nephew at bedside. Patient stating he feels bloated yet refuses to pass gas. Patient refuses to go to restroom. Abdomen soft.

## 2015-09-28 NOTE — Op Note (Signed)
[facility] Patient Name: Randy Fisher Procedure Date: 09/28/2015 10:11 AM MRN: [id number] Endoscopist: Iva Booparl E Krikor Willet , MD Age: 60 Referring MD:  Date of Birth: 05/12/1955 Gender: Male Account #: [id number] Procedure:                Colonoscopy Indications:              Evaluation of unexplained GI bleeding, Rectal                            bleeding, Change in bowel habits Medicines:                Propofol per Anesthesia, Monitored Anesthesia Care Procedure:                Pre-Anesthesia Assessment:                           - Prior to the procedure, a History and Physical                            was performed, and patient medications and                            allergies were reviewed. The patient's tolerance of                            previous anesthesia was also reviewed. The risks                            and benefits of the procedure and the sedation                            options and risks were discussed with the patient.                            All questions were answered, and informed consent                            was obtained. Prior Anticoagulants: The patient has                            taken no previous anticoagulant or antiplatelet                            agents. ASA Grade Assessment: II - A patient with                            mild systemic disease. After reviewing the risks                            and benefits, the patient was deemed in                            satisfactory condition to undergo the procedure.  After obtaining informed consent, the colonoscope                            was passed under direct vision. Throughout the                            procedure, the patient's blood pressure, pulse, and                            oxygen saturations were monitored continuously. The                            Model CF-HQ190L [id number]) scope was introduced                            through  the anus and advanced to the the cecum,                            identified by appendiceal orifice and ileocecal                            valve. The colonoscopy was performed without                            difficulty. The ileocecal valve, appendiceal                            orifice, and rectum were photographed. The quality                            of the bowel preparation was good. Scope In: 10:21:23 AM Scope Out: 10:39:33 AM Scope Withdrawal Time: 0 hours 14 minutes 2 seconds  Total Procedure Duration: 0 hours 18 minutes 10 seconds  Findings:                 The perianal and digital rectal examinations were                            normal. Pertinent negatives include normal prostate                            (size, shape, and consistency).                           Internal hemorrhoids were found during retroflexion.                           The colon (entire examined portion) was moderately                            redundant. Advancing the scope required using                            manual pressure.  The exam was otherwise without abnormality on                            direct and retroflexion views. Complications:            No immediate complications. Estimated Blood Loss:     Estimated blood loss: none. Impression:               - Internal hemorrhoids.                           - Redundant colon.                           - The examination was otherwise normal on direct                            and retroflexion views.                           - No specimens collected. Recommendation:           - Patient has a contact number available for                            emergencies. The signs and symptoms of potential                            delayed complications were discussed with the                            patient. Return to normal activities tomorrow.                            Written discharge instructions were provided  to the                            patient.                           - High fiber diet.                           - Continue present medications.                           - Repeat colonoscopy in 10 years for screening                            purposes.                           - Stay on MiraLax                           - If persistent problems return to me - consider  hemorrhoid banding but regulating bowel habits will                            hopefully take care of his problems. Diabetice                            neuropathy may be playing a role in constipation                            and GI sxs. Iva Boop, MD 09/28/2015 10:49:46 AM This report has been signed electronically.

## 2015-09-28 NOTE — Patient Instructions (Addendum)
Nothing bad here. Bawasiri (hemorrhoids) are causing the bleeding. These are not dangerous.  Please continue MiraLax tio help bowels move. Keep diabetes under control.   Next routine colonoscopy in 10 years - 2027  I appreciate the opportunity to care for you. Iva Booparl E. Gessner, MD, FACG  YOU HAD AN ENDOSCOPIC PROCEDURE TODAY AT THE [facility]:   Refer to the procedure report that was given to you for any specific questions about what was found during the examination.  If the procedure report does not answer your questions, please call your gastroenterologist to clarify.  If you requested that your care partner not be given the details of your procedure findings, then the procedure report has been included in a sealed envelope for you to review at your convenience later.  YOU SHOULD EXPECT: Some feelings of bloating in the abdomen. Passage of more gas than usual.  Walking can help get rid of the air that was put into your GI tract during the procedure and reduce the bloating. If you had a lower endoscopy (such as a colonoscopy or flexible sigmoidoscopy) you may notice spotting of blood in your stool or on the toilet paper. If you underwent a bowel prep for your procedure, you may not have a normal bowel movement for a few days.  Please Note:  You might notice some irritation and congestion in your nose or some drainage.  This is from the oxygen used during your procedure.  There is no need for concern and it should clear up in a day or so.  SYMPTOMS TO REPORT IMMEDIATELY:   Following lower endoscopy (colonoscopy or flexible sigmoidoscopy):  Excessive amounts of blood in the stool  Significant tenderness or worsening of abdominal pains  Swelling of the abdomen that is new, acute  Fever of 100F or higher   For urgent or emergent issues, a gastroenterologist can be reached at any hour by calling [phone].   DIET: Your first meal following the procedure should be  a small meal and then it is ok to progress to your normal diet. Heavy or fried foods are harder to digest and may make you feel nauseous or bloated.  Likewise, meals heavy in dairy and vegetables can increase bloating.  Drink plenty of fluids but you should avoid alcoholic beverages for 24 hours.  ACTIVITY:  You should plan to take it easy for the rest of today and you should NOT DRIVE or use heavy machinery until tomorrow (because of the sedation medicines used during the test).    FOLLOW UP: Our staff will call the number listed on your records the next business day following your procedure to check on you and address any questions or concerns that you may have regarding the information given to you following your procedure. If we do not reach you, we will leave a message.  However, if you are feeling well and you are not experiencing any problems, there is no need to return our call.  We will assume that you have returned to your regular daily activities without incident.  If any biopsies were taken you will be contacted by phone or by letter within the next 1-3 weeks.  Please call us at [phone] if you have not heard about the biopsies in 3 weeks.    SIGNATURES/CONFIDENTIALITY: You and/or your care partner have signed paperwork which will be entered into your electronic medical record.  These signatures attest to the fact that that the information above on  your After Visit Summary has been reviewed and is understood.  Full responsibility of the confidentiality of this discharge information lies with you and/or your care-partner. 

## 2015-09-28 NOTE — Progress Notes (Signed)
A/ox3 pleased with MAC, report to Karen RN 

## 2015-09-28 NOTE — Progress Notes (Signed)
Blood glucose 64 on arrival in recovery. Closed NS, D5W open.

## 2015-09-29 ENCOUNTER — Telehealth: Payer: Self-pay | Admitting: *Deleted

## 2015-09-29 NOTE — Telephone Encounter (Signed)
  Follow up Call-  Call back number 09/28/2015  Post procedure Call Back phone  # [phone]  Fari Darbi  Case Manager from [facility] [employer]     Patient questions:  Message left to call us if necessary.

## 2015-10-08 ENCOUNTER — Other Ambulatory Visit: Payer: Self-pay | Admitting: Student

## 2015-11-02 ENCOUNTER — Telehealth: Payer: Self-pay | Admitting: *Deleted

## 2015-11-02 ENCOUNTER — Ambulatory Visit: Payer: Medicaid Other | Admitting: Internal Medicine

## 2015-11-02 NOTE — Telephone Encounter (Signed)
Client wanted appointment made for him to see MD c/o his knees hurting . Called and made an appointment. Information given to client. Estée LauderHelena Kareema Keitt RN BSN CN [phone]

## 2015-11-30 ENCOUNTER — Ambulatory Visit (INDEPENDENT_AMBULATORY_CARE_PROVIDER_SITE_OTHER): Payer: Medicaid Other | Admitting: Family Medicine

## 2015-11-30 VITALS — BP 122/76 | HR 86 | Temp 98.8°F | Ht 65.0 in | Wt 172.0 lb

## 2015-11-30 DIAGNOSIS — M25561 Pain in right knee: Secondary | ICD-10-CM

## 2015-11-30 DIAGNOSIS — M25529 Pain in unspecified elbow: Secondary | ICD-10-CM

## 2015-11-30 DIAGNOSIS — M25562 Pain in left knee: Secondary | ICD-10-CM | POA: Diagnosis not present

## 2015-11-30 MED ORDER — MELOXICAM 15 MG PO TABS: 15.0000 mg | ORAL_TABLET | Freq: Every day | ORAL | 0 refills | Status: DC

## 2015-11-30 NOTE — Assessment & Plan Note (Addendum)
Bilateral elbow pain in medial and lateral epicondyle areas. Exam slightly limited due to language barrier and patient compliance. However findings most consistent with osteoarthritic pain. No signs of septic joint or gout. Unknown likely that patient has medial and lateral epicondylitis bilaterally. Could consider rheumatologic process. -We'll try 15 mg of meloxicam daily for 2 weeks -Follow-up with PCP in 2 weeks -If symptoms persist may consider further studies to include elbow x-rays as well as autoimmune workup

## 2015-11-30 NOTE — Progress Notes (Signed)
Subjective:    Patient ID: Randy Fisher , male   DOB: 07/09/1955 , 60 y.o..   MRN: [id number]  HPI  Randy Fisher is here for a same day appointment for knee pain.  Chief Complaint  Patient presents with  . Knee Pain     Swahili interpreter used via [employer] interpreter's  Patient comes in today with complaints of bilateral knee pain and bilateral elbow pain for the last 1-1/2 months. He states that it is very painful to move these joints and it is hard for him to sleep at night. The pain is described as piercing. He has tried massaging the joints as well as hot water which have not provided any relief. Denies any trauma or inciting event. 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 Does have some difficulty ambulating especially when going upstairs, he feels like he needs to sit a lot. Denies any weakness, numbness, tingling or fevers. Admits to some joint swelling around his knees which wax and wane.   Review of Systems: Per HPI. All other systems reviewed and are negative.   Past Medical History: Patient Active Problem List   Diagnosis Date Noted  . Elbow pain 11/30/2015  . Achilles tendinosis 09/19/2015  . Goals of care, counseling/discussion 09/13/2015  . Cataract 07/04/2015  . Abdominal pain 06/22/2015  . HTN (hypertension) 06/22/2015  . Constipation 03/22/2015  . CHF (congestive heart failure) (HCC) 12/31/2014  . Refugee health examination 10/11/2014  . Language barrier 10/11/2014  . Back pain 08/26/2014  . T2DM (type 2 diabetes mellitus) (HCC) 08/26/2014  . History of alcohol abuse 08/26/2014  . Knee pain, bilateral 08/26/2014    Medications: reviewed and updated Current Outpatient Prescriptions  Medication Sig Dispense Refill  . ACCU-CHEK SOFTCLIX LANCETS lancets Use to check your blood sugar in the morning before breakfast, 2 hours after lunch and dinner 100 each 12  . aspirin EC 81 MG EC tablet Take 1  tablet (81 mg total) by mouth daily. 30 tablet 11  . atorvastatin (LIPITOR) 40 MG tablet Take 1 tablet by mouth daily at 6 pm 30 tablet 6  . Blood Glucose Monitoring Suppl (ACCU-CHEK AVIVA) device Use as instructed 1 each 0  . carvedilol (COREG) 3.125 MG tablet Take 3.125 mg by mouth 2 (two) times daily.  3  . cyclobenzaprine (FLEXERIL) 5 MG tablet Take 1 tablet (5 mg total) by mouth 3 (three) times daily as needed for muscle spasms. 30 tablet 0  . furosemide (LASIX) 20 MG tablet TAKE 1 TABLET BY MOUTH DAILY 30 tablet 5  . gabapentin (NEURONTIN) 300 MG capsule Take 1 capsule (300 mg total) by mouth 3 (three) times daily. 90 capsule 2  . glucose blood (ACCU-CHEK AVIVA) test strip Use to check your blood sugar in the morning before breakfast, 2 hours after lunch and dinner 100 each 12  . insulin glargine (LANTUS) 100 UNIT/ML injection Inject 0.15 mLs (15 Units total) into the skin daily after breakfast. 10 mL   . Insulin Syringe-Needle U-100 29G X 7/16" 1 ML MISC Use to inject insulin once daily 30 each 11  . lactobacillus acidophilus & bulgar (LACTINEX) chewable tablet Chew 1 tablet by mouth 3 (three) times daily with meals. Reported on 08/26/2015    . Lancet Devices (AUTOLET LANCING DEVICE) MISC use to check blood sugar in the morning before breakfast, 2 hours after lunch and dinner 1 each 0  . lisinopril (PRINIVIL,ZESTRIL) 5 MG tablet  take half a TABLET BY MOUTH EVERY DAY 15 tablet 6  . meloxicam (MOBIC) 15 MG tablet Take 1 tablet (15 mg total) by mouth daily. 30 tablet 0  . metFORMIN (GLUCOPHAGE) 1000 MG tablet Take 1 tablet by mouth twice daily with a meal * Stop 500mg  when you start this 60 tablet 3  . Misc. Devices (DIGITAL GLASS SCALE) MISC Check weight daily 1 each 0  . naproxen (NAPROSYN) 500 MG tablet Take 1 tablet (500 mg total) by mouth 2 (two) times daily. 60 tablet 3  . polyethylene glycol powder (GLYCOLAX/MIRALAX) powder Take 17 g by mouth 3 (three) times daily. 850 g 6  . Probiotic  Product (PROBIOTIC DAILY PO) Take 1 tablet by mouth 2 (two) times daily.    Marland Kitchen. senna-docusate (SENOKOT-S) 8.6-50 MG tablet Take 1 tablet by mouth 2 (two) times daily. 60 tablet 2   No current facility-administered medications for this visit.     Social Hx:  reports that he has never smoked. He does not have any smokeless tobacco history on file.    Objective:   BP 122/76   Pulse 86   Temp 98.8 F (37.1 C) (Oral)   Ht 5\' 5"  (1.651 m)   Wt 172 lb (78 kg)   BMI 28.62 kg/m  Physical Exam  Gen: NAD, alert, cooperative with exam, well-appearing MSK:   Bilateral Elbow exam: lateral epicondylar tenderness, medial epicondylar tenderness, full ROM, no swelling. Neurovascularly intact.   Bilateral Knee: Normal to inspection with no erythema or effusion or obvious bony abnormalities. Palpation with joint line tenderness, patellar tenderness. No warmth.  ROM full in flexion and extension and lower leg rotation although this is painful. Ligaments with solid consistent endpoints including ACL, PCL, LCL, MCL. Patellar glide withcrepitus. Patellar and quadriceps tendons unremarkable. Hamstring and quadriceps strength is normal.  Neurovascularly intact  Assessment & Plan:  Knee pain, bilateral Exam slightly limited due to language barrier and patient compliance. However findings most consistent with osteoarthritic pain. No signs of ligamentous tear, septic joint or gout. Could consider rheumatologic process as well. -We'll try 15 mg of meloxicam daily for 2 weeks -Follow-up with PCP in 2 weeks -If symptoms persist may consider further studies to include knee x-rays as well as autoimmune workup  Elbow pain Bilateral elbow pain in medial and lateral epicondyle areas. Exam slightly limited due to language barrier and patient compliance. However findings most consistent with osteoarthritic pain. No signs of septic joint or gout. Unknown likely that patient has medial and lateral epicondylitis  bilaterally. Could consider rheumatologic process. -We'll try 15 mg of meloxicam daily for 2 weeks -Follow-up with PCP in 2 weeks -If symptoms persist may consider further studies to include elbow x-rays as well as autoimmune workup   Anders Simmondshristina Laurence Folz, MD [facility], PGY-2

## 2015-11-30 NOTE — Assessment & Plan Note (Signed)
Exam slightly limited due to language barrier and patient compliance. However findings most consistent with osteoarthritic pain. No signs of ligamentous tear, septic joint or gout. Could consider rheumatologic process as well. -We'll try 15 mg of meloxicam daily for 2 weeks -Follow-up with PCP in 2 weeks -If symptoms persist may consider further studies to include knee x-rays as well as autoimmune workup

## 2015-11-30 NOTE — Patient Instructions (Addendum)
Thank you for coming in today, it was so nice to see you! Today we talked about:    Knee Pain: We will start you on a medication for knee pain. If the pain does not improve, please follow up with your primary doctor (Dr. Beryle FlockBacigalupo).   Please go to the hospital if your knee pain gets worse to where you can't walk, you get fever, or you notice redness around your knee.   If you have any questions or concerns, please do not hesitate to call the office at [phone]. You can also message me directly via MyChart.   Sincerely,  Anders Simmondshristina Gambino, MD

## 2015-12-14 ENCOUNTER — Ambulatory Visit (INDEPENDENT_AMBULATORY_CARE_PROVIDER_SITE_OTHER): Payer: Medicaid Other | Admitting: Family Medicine

## 2015-12-14 VITALS — BP 115/74 | HR 78 | Temp 98.6°F | Ht 65.0 in | Wt 168.8 lb

## 2015-12-14 DIAGNOSIS — M25562 Pain in left knee: Secondary | ICD-10-CM

## 2015-12-14 DIAGNOSIS — M25561 Pain in right knee: Secondary | ICD-10-CM

## 2015-12-14 MED ORDER — PREDNISONE 50 MG PO TABS: 50.0000 mg | ORAL_TABLET | Freq: Every day | ORAL | 0 refills | Status: DC

## 2015-12-14 MED ORDER — TRAMADOL HCL 50 MG PO TABS: 50.0000 mg | ORAL_TABLET | Freq: Two times a day (BID) | ORAL | 0 refills | Status: DC | PRN

## 2015-12-14 NOTE — Patient Instructions (Addendum)
Thank you so much for coming to visit today! We will check several labs today. I have ordered Predisone for you to take once daily over the next 5 days. I have ordered Tramadol for you to take every 12hr as needed for pain. Please note there is an increased risk of falls with this medication. Please return in 1-2 weeks to see your PCP.  Dr. Caroleen Hammanumley

## 2015-12-14 NOTE — Progress Notes (Signed)
Subjective:     Patient ID: Randy Fisher, male   DOB: March 09, 1956, 60 y.o.   MRN: [id number]  HPI Mr. Cosby is a 60yo male presenting for bilateral knee and elbow pain. Visit conducted with the aid of Swahili interpreter. - Previously seen on 9/13 for bilateral knee pain and elbow pain x1-1.33month. Difficulty ambulating, especially when going up stairs. Suspected to be secondary to osteoarthritis. Meloxicam 175mprescribed.  -Continues to note bilateral knees and elbow pain. Also reports pain in back, bilateral hands (at MCP and PIP joints, no pain in DIP), and left shoulder. -Reports swelling occurs intermittently in both knees, elbows, and hands. -Reports morning stiffness, often lasting several hours -Pain is been present for 2 months, constant -Previously prescribed Modic. Reports this is not helping -No immunological workup done -Requests disability paperwork. States it was sent to his PCP. -Nonsmoker  Review of Systems Per HPI    Objective:   Physical Exam  Constitutional: He appears well-developed and well-nourished. No distress.  HENT:  Head: Normocephalic and atraumatic.  Cardiovascular: Normal rate and regular rhythm.   No murmur heard. Pulmonary/Chest: Effort normal. No respiratory distress. He has no wheezes.  Abdominal: Soft. He exhibits no distension. There is no tenderness.  Musculoskeletal:  Knees symmetric without signs of effusion. Flexion limited to 45 when watching, however when patient was not aware he was being watched was closer to 75-80. Diffuse tenderness noted over bilateral knees. Anterior and posterior drawer of knees are negative. Medial and lateral collateral ligaments intact. No patellar apprehension. Unable to tolerate Thessaly's. Diffuse tenderness over medial and lateral epicondyles. Full passive range of motion noted of elbows, however when directly questioned range of motion it was significantly limited. Antalgic gait noted, however nurse noted this  was more pronounced than she noted initially.  Psychiatric: He has a normal mood and affect. His behavior is normal.      Assessment and Plan:     Bilateral Joint Pain: Knees, Elbows, Hands -Will initiate rheumatological workup given bilateral nature, swelling, and morning stiffness -Will obtain rheumatoid factor, anti-CCP, ESR, CRP, ANA -Consider imaging if workup negative -1 week course of tramadol prescribed. Discussed that this can increase risk of falls. -Given concern for rheumatological cause, will give short burst of prednisone -Follow-up in 1-2 weeks with PCP

## 2015-12-15 LAB — ANA: Anti Nuclear Antibody(ANA): NEGATIVE

## 2015-12-15 LAB — SEDIMENTATION RATE: SED RATE: 7 mm/h (ref 0–20)

## 2015-12-15 LAB — C-REACTIVE PROTEIN: CRP: 5.4 mg/L (ref <8.0)

## 2015-12-15 LAB — RHEUMATOID FACTOR: Rhuematoid fact SerPl-aCnc: <14 IU/mL (ref <14)

## 2015-12-15 LAB — CYCLIC CITRUL PEPTIDE ANTIBODY, IGG: Cyclic Citrullin Peptide Ab: <16 Units

## 2015-12-21 ENCOUNTER — Ambulatory Visit (INDEPENDENT_AMBULATORY_CARE_PROVIDER_SITE_OTHER): Payer: Medicaid Other | Admitting: Student

## 2015-12-21 VITALS — BP 129/84 | HR 74 | Temp 97.6°F | Ht 65.0 in | Wt 170.8 lb

## 2015-12-21 DIAGNOSIS — M79602 Pain in left arm: Secondary | ICD-10-CM | POA: Diagnosis not present

## 2015-12-21 DIAGNOSIS — R200 Anesthesia of skin: Secondary | ICD-10-CM | POA: Insufficient documentation

## 2015-12-21 MED ORDER — NAPROXEN 500 MG PO TABS: 500.0000 mg | ORAL_TABLET | Freq: Two times a day (BID) | ORAL | 3 refills | Status: DC

## 2015-12-21 MED ORDER — NAPROXEN 500 MG PO TABS: 500.0000 mg | ORAL_TABLET | Freq: Two times a day (BID) | ORAL | 0 refills | Status: DC
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
## 2015-12-21 NOTE — Patient Instructions (Addendum)
It was great seeing you today! We have addressed the following issues today  1. Left arm and shoulder pain: Please take the prescription I gave you to the pharmacy to have it filled. You may have some of this medicine at home. Make sure you check the names before you take your medicines. Don't take Meloxicam while you take this medicine. Please make sure you take it with food so that it doesn't upset your stomach. I have also ordered an x-ray of your shoulder and arm. Please go [facility] to have this done. We will contact you when we get the results of the x-rays 2. About your application for food stump: We have given you a letter to show that you were here in our clinic on 11/30/2015. Please take the letter to you caseworker. They can contact us if they have more question.    If we did any lab work today, and the results require attention, either me or my nurse will get in touch with you. If everything is normal, you will get a letter in mail. If you don't hear from us in two weeks, please give us a call. Otherwise, I look forward to talking with you again at our next visit. If you have any questions or concerns before then, please call the clinic at [phone].  Please bring all your medications to every doctors visit   Sign up for My Chart to have easy access to your labs results, and communication with your Primary care physician.    Please check-out at the front desk before leaving the clinic.   Take Care,

## 2015-12-21 NOTE — Assessment & Plan Note (Addendum)
Unclear what is causing his pain at this time. It appears there are musculoskeletal and neurologic components to it. The limited range of motion is concerning for frozen shoulder versus severe osteoarthritis. However, this doesn't explain reduced sensation, weakness and numbness in his left extremity. The distribution of his neurologic findings also make this very odd. He has no constitutional symptoms. He had elevated CRP to 5.7 about a week ago. However, his ESR and limited rheumatologic workup was normal. I discussed the patient with Dr. Erin Hearing who was the preceptor for the day. We have agreed to start with plain films of his left shoulder and left humerus. I have also given him a prescription for naproxen 500 mg twice a day and advised him to take with food. If no findings on a plain film and he continues to have these signs and symptoms, I think it is reasonable to consider MRI of his left shoulder at least.   I have given him a letter he requested for his food stamp application. We have also gotten him some packed foods from our social worker to take home.   Very challenging patient due to language barrier, low medical litracy and social issues.

## 2016-01-11 ENCOUNTER — Telehealth: Payer: Self-pay | Admitting: Family Medicine

## 2016-01-11 NOTE — Telephone Encounter (Signed)
Case manager is inuring about referral for an orthopedist for the pt's leg pain. Please advise. Thanks! ep

## 2016-01-12 ENCOUNTER — Ambulatory Visit (HOSPITAL_COMMUNITY)
Admission: RE | Admit: 2016-01-12 | Discharge: 2016-01-12 | Disposition: A | Discharge status: Home / Self Care | Payer: Medicaid Other | Source: Ambulatory Visit | Attending: Student | Admitting: Student

## 2016-01-12 DIAGNOSIS — R200 Anesthesia of skin: Secondary | ICD-10-CM | POA: Diagnosis not present

## 2016-01-12 DIAGNOSIS — M79602 Pain in left arm: Secondary | ICD-10-CM | POA: Diagnosis present

## 2016-01-12 NOTE — Telephone Encounter (Signed)
Did any of you discuss Ortho or Rheum referral with patient at recent appts?  I cannot tell if referral was placed and I haven't seen the patient for ortho issues.  Erasmo DownerAngela M Alyson Ki, MD, MPH PGY-3,  [facility] 01/12/2016 10:20 AM

## 2016-01-12 NOTE — Telephone Encounter (Signed)
Ordered X-ray of his left shoulder and left humerus. That is all.  Thanks

## 2016-01-13 NOTE — Telephone Encounter (Signed)
Attempted to call case manager back but no answer. It appears patient was referred to sports medicine in 09/2015. This referral was processed the patient was able to be contacted. Please let case manager know that it is okay to schedule the patient an appointment at sports medicine to be seen when possible.  Erasmo DownerAngela M Ameka Krigbaum, MD, MPH PGY-3,  [facility] 01/13/2016 10:51 AM

## 2016-01-16 ENCOUNTER — Encounter: Payer: Self-pay | Admitting: Student

## 2016-01-16 NOTE — Progress Notes (Signed)
Left shoulder and left humerus x-ray within normal. Sent result letter to the patient

## 2016-01-20 NOTE — Telephone Encounter (Signed)
Attempted to call case manager, no answer or voicemail set up. Randy Fisher, CMA

## 2016-04-18 ENCOUNTER — Other Ambulatory Visit: Payer: Self-pay | Admitting: Family Medicine

## 2016-04-18 NOTE — Telephone Encounter (Signed)
Pt needs a refill on lantus, lancets, and test strips. Pt uses [facility]. ep

## 2016-04-19 ENCOUNTER — Other Ambulatory Visit: Payer: Self-pay | Admitting: Family Medicine

## 2016-04-19 MED ORDER — ACCU-CHEK SOFTCLIX LANCETS MISC: 12 refills | Status: DC

## 2016-04-19 MED ORDER — GLUCOSE BLOOD VI STRP: ORAL_STRIP | 12 refills | Status: DC

## 2016-04-19 MED ORDER — INSULIN GLARGINE 100 UNIT/ML ~~LOC~~ SOLN: 15.0000 [IU] | Freq: Every day | SUBCUTANEOUS | Status: DC

## 2016-04-25 NOTE — Congregational Nurse Program (Unsigned)
Congregational Nurse Program Note  Date of Encounter: 04/03/2016  Past Medical History: Past Medical History:  Diagnosis Date  . Alcohol abuse   . Diabetes mellitus without complication (HCC)     Encounter Details:  Office visit at [facility] requesting blood sugar check after arrival at school. Admits to not eating prior to arrival. Currently has prescription for  Lantis 100 U/ml., 1.25 U and Metformin for diabetes. Aware of medication, but did not take any today. Alert and responsive  in limited English. Swahili primary language. CBG 51 @ 11:15. Repeated at 11:45 after eating crackers and coke. CBG 96. Counseled in daily diet needed and importance of medication scheduling. Brief review of booklet and management of diabetes, testing and diet. Return to office for support and follow-up 1 week. Ferol LuzMarietta Kiven Vangilder, RN/CN.

## 2016-04-25 NOTE — Congregational Nurse Program (Signed)
Congregational Nurse Program Note  Date of Encounter: 04/25/2016  Past Medical History: Past Medical History:  Diagnosis Date  . Alcohol abuse   . Diabetes mellitus without complication [facility](HCC)     Encounter Details:     CNP Questionnaire - 04/25/16 1130      Patient Demographics   Is this a new or existing patient? Existing   Patient is considered a/an Refugee   Race African     Patient Assistance   Location of Patient Assistance Not Applicable   Patient's financial/insurance status Medicaid   Uninsured Patient ([place] Card/Care Connects) No   Patient referred to apply for the following financial assistance Not Applicable   Food insecurities addressed Not Applicable   Transportation assistance No   Assistance securing medications No   Educational health offerings Diabetes;Medications     Encounter Details   Primary purpose of visit Acute Illness/Condition Visit   Was an Emergency Department visit averted? Yes   Does patient have a medical provider? Yes   Patient referred to Follow up with established PCP   Was a mental health screening completed? (GAINS tool) No   Does patient have dental issues? No   Does patient have vision issues? Yes   Was a vision referral made? No   Does your patient have an abnormal blood pressure today? Yes   Since previous encounter, have you referred patient for abnormal blood pressure that resulted in a new diagnosis or medication change? No   Does your patient have an abnormal blood glucose today? Yes   Since previous encounter, have you referred patient for abnormal blood glucose that resulted in a new diagnosis or medication change? No   Was there a life-saving intervention made? No     [facility]. Requested change in PCP appointment. [facility]. Expressed concern about housing and utilities issues. Return prn for review of BS and follow-up with agency SW to discuss  housing issues. Ferol LuzMarietta Kimberly Nieland, RN/CN

## 2016-04-25 NOTE — Congregational Nurse Program (Signed)
Congregational Nurse Program Note  Date of Encounter: 04/10/2016  Past Medical History: Past Medical History:  Diagnosis Date  . Alcohol abuse   . Diabetes mellitus without complication [facility](HCC)     Encounter Details:     CNP Questionnaire - 04/25/16 1420      Patient Demographics   Is this a new or existing patient? Existing   Patient is considered a/an Refugee     Patient Assistance   Location of Patient Assistance Not Applicable   Patient's financial/insurance status Medicaid   Uninsured Patient ([place] Card/Care Connects) No   Patient referred to apply for the following financial assistance Not Applicable   Food insecurities addressed Not Applicable   Transportation assistance No   Assistance securing medications No   Educational health offerings Diabetes;Medications     Encounter Details   Primary purpose of visit Acute Illness/Condition Visit   Was an Emergency Department visit averted? Yes   Does patient have a medical provider? Yes   Patient referred to Follow up with established PCP   Was a mental health screening completed? (GAINS tool) No   Does patient have dental issues? No   Does patient have vision issues? Yes   Was a vision referral made? No   Does your patient have an abnormal blood pressure today? Yes   Since previous encounter, have you referred patient for abnormal blood pressure that resulted in a new diagnosis or medication change? No   Does your patient have an abnormal blood glucose today? Yes   Since previous encounter, have you referred patient for abnormal blood glucose that resulted in a new diagnosis or medication change? No   Was there a life-saving intervention made? No     Follow-up visit at [facility] to review Diabetic medications and testing. Need to confirm Medicaid information. Reports out of test strips and insulin today. CBG 115; fasting at 9:45 am. [facility], [address] contacted regarding medication and test strips  renewal. Medicaid coverage current and request provided to pharmacy today. Client agreed to secure meds and test strips today. [facility]. Appointment 05/09/16 at 2:45.  Ferol LuzMarietta Eiza Canniff, RN/CN.

## 2016-05-09 ENCOUNTER — Ambulatory Visit: Payer: Medicaid Other | Admitting: Family Medicine

## 2016-05-22 ENCOUNTER — Ambulatory Visit (INDEPENDENT_AMBULATORY_CARE_PROVIDER_SITE_OTHER): Payer: Medicaid Other | Admitting: Family Medicine

## 2016-05-22 ENCOUNTER — Telehealth: Payer: Self-pay

## 2016-05-22 VITALS — BP 130/85 | HR 68 | Temp 97.8°F | Ht 65.0 in | Wt 175.4 lb

## 2016-05-22 DIAGNOSIS — I10 Essential (primary) hypertension: Secondary | ICD-10-CM

## 2016-05-22 DIAGNOSIS — Z609 Problem related to social environment, unspecified: Secondary | ICD-10-CM

## 2016-05-22 DIAGNOSIS — K5900 Constipation, unspecified: Secondary | ICD-10-CM

## 2016-05-22 DIAGNOSIS — E119 Type 2 diabetes mellitus without complications: Secondary | ICD-10-CM

## 2016-05-22 LAB — BASIC METABOLIC PANEL WITH GFR
BUN: 12 mg/dL (ref 7–25)
CHLORIDE: 103 mmol/L (ref 98–110)
CO2: 31 mmol/L (ref 20–31)
CREATININE: 0.75 mg/dL (ref 0.70–1.25)
Calcium: 9.3 mg/dL (ref 8.6–10.3)
GFR, Est African American: >89 mL/min (ref >=60)
GFR, Est Non African American: >89 mL/min (ref >=60)
Glucose, Bld: 101 mg/dL — ABNORMAL HIGH (ref 65–99)
Potassium: 4.8 mmol/L (ref 3.5–5.3)
SODIUM: 140 mmol/L (ref 135–146)

## 2016-05-22 LAB — POCT GLYCOSYLATED HEMOGLOBIN (HGB A1C): Hemoglobin A1C: 7.3

## 2016-05-22 MED ORDER — METFORMIN HCL 1000 MG PO TABS: 1000.0000 mg | ORAL_TABLET | Freq: Two times a day (BID) | ORAL | 5 refills | Status: DC

## 2016-05-22 MED ORDER — ATORVASTATIN CALCIUM 40 MG PO TABS: ORAL_TABLET | ORAL | 6 refills | Status: DC

## 2016-05-22 MED ORDER — GABAPENTIN 300 MG PO CAPS: 300.0000 mg | ORAL_CAPSULE | Freq: Three times a day (TID) | ORAL | 2 refills | Status: DC

## 2016-05-22 MED ORDER — INSULIN GLARGINE 100 UNIT/ML ~~LOC~~ SOLN: 18.0000 [IU] | Freq: Every day | SUBCUTANEOUS | 2 refills | Status: DC

## 2016-05-22 MED ORDER — POLYETHYLENE GLYCOL 3350 17 GM/SCOOP PO POWD: 17.0000 g | Freq: Three times a day (TID) | ORAL | 6 refills | Status: DC

## 2016-05-22 MED ORDER — SENNOSIDES-DOCUSATE SODIUM 8.6-50 MG PO TABS: 1.0000 | ORAL_TABLET | Freq: Two times a day (BID) | ORAL | 2 refills | Status: DC

## 2016-05-22 MED ORDER — CARVEDILOL 3.125 MG PO TABS: 3.1250 mg | ORAL_TABLET | Freq: Two times a day (BID) | ORAL | 3 refills | Status: DC

## 2016-05-22 MED ORDER — LISINOPRIL 5 MG PO TABS: ORAL_TABLET | ORAL | 6 refills | Status: DC

## 2016-05-22 MED ORDER — ASPIRIN 81 MG PO TBEC: 81.0000 mg | DELAYED_RELEASE_TABLET | Freq: Every day | ORAL | 11 refills | Status: DC

## 2016-05-22 NOTE — Assessment & Plan Note (Signed)
Control is improving with A1c of 7.3 Increase Lantus to 18 units daily Continue metformin Discussed hypoglycemia precautions Follow-up in one month

## 2016-05-22 NOTE — Patient Instructions (Addendum)
A1c is better.  Increase Lantus to 18 units daily  Make appointment with [facility] [phone]  Continue lisinopril and coreg for blood pressure  Next appointment in 1 month for diabetes, sooner if needed    High-Fiber Diet Fiber, also called dietary fiber, is a type of carbohydrate found in fruits, vegetables, whole grains, and beans. A high-fiber diet can have many health benefits. Your health care provider may recommend a high-fiber diet to help:  Prevent constipation. Fiber can make your bowel movements more regular.  Lower your cholesterol.  Relieve hemorrhoids, uncomplicated diverticulosis, or irritable bowel syndrome.  Prevent overeating as part of a weight-loss plan.  Prevent heart disease, type 2 diabetes, and certain cancers. What is my plan? The recommended daily intake of fiber includes:  38 grams for men under age 89.  30 grams for men over age 5.  25 grams for women under age 68.  21 grams for women over age 40. You can get the recommended daily intake of dietary fiber by eating a variety of fruits, vegetables, grains, and beans. Your health care provider may also recommend a fiber supplement if it is not possible to get enough fiber through your diet. What do I need to know about a high-fiber diet?  Fiber supplements have not been widely studied for their effectiveness, so it is better to get fiber through food sources.  Always check the fiber content on thenutrition facts label of any prepackaged food. Look for foods that contain at least 5 grams of fiber per serving.  Ask your dietitian if you have questions about specific foods that are related to your condition, especially if those foods are not listed in the following section.  Increase your daily fiber consumption gradually. Increasing your intake of dietary fiber too quickly may cause bloating, cramping, or gas.  Drink plenty of water. Water helps you to digest fiber. What foods can I  eat? Grains  Whole-grain breads. Multigrain cereal. Oats and oatmeal. Brown rice. Barley. Bulgur wheat. Millet. Bran muffins. Popcorn. Rye wafer crackers. Vegetables  Sweet potatoes. Spinach. Kale. Artichokes. Cabbage. Broccoli. Green peas. Carrots. Squash. Fruits  Berries. Pears. Apples. Oranges. Avocados. Prunes and raisins. Dried figs. Meats and Other Protein Sources  Navy, kidney, pinto, and soy beans. Split peas. Lentils. Nuts and seeds. Dairy  Fiber-fortified yogurt. Beverages  Fiber-fortified soy milk. Fiber-fortified orange juice. Other  Fiber bars. The items listed above may not be a complete list of recommended foods or beverages. Contact your dietitian for more options.  What foods are not recommended? Grains  White bread. Pasta made with refined flour. White rice. Vegetables  Fried potatoes. Canned vegetables. Well-cooked vegetables. Fruits  Fruit juice. Cooked, strained fruit. Meats and Other Protein Sources  Fatty cuts of meat. Fried Environmental education officer or fried fish. Dairy  Milk. Yogurt. Cream cheese. Sour cream. Beverages  Soft drinks. Other  Cakes and pastries. Butter and oils. The items listed above may not be a complete list of foods and beverages to avoid. Contact your dietitian for more information.  What are some tips for including high-fiber foods in my diet?  Eat a wide variety of high-fiber foods.  Make sure that half of all grains consumed each day are whole grains.  Replace breads and cereals made from refined flour or white flour with whole-grain breads and cereals.  Replace white rice with brown rice, bulgur wheat, or millet.  Start the day with a breakfast that is high in fiber, such as a cereal  that contains at least 5 grams of fiber per serving.  Use beans in place of meat in soups, salads, or pasta.  Eat high-fiber snacks, such as berries, raw vegetables, nuts, or popcorn. This information is not intended to replace advice given to you by your  health care provider. Make sure you discuss any questions you have with your health care provider. Document Released: 03/05/2005 Document Revised: 08/11/2015 Document Reviewed: 08/18/2013 Elsevier Interactive Patient Education  2017 [employer]

## 2016-05-22 NOTE — Telephone Encounter (Signed)
Called for client . His medications are ready and he has to be home . They were delivered on the weekend but he was not there. Pharmacy will deliver today. Also called his daughters MD to follow up with her on 05/28/2016. Noreta, Love.at [facility].I wrote out a note for [facility] to show the client the over counter medications ordered for her. This was explained to them by interpreter and  Understood. [employer] CorporationHelena Philipp Callegari Market researcherN BSN CN [employer]

## 2016-05-22 NOTE — Assessment & Plan Note (Signed)
Borderline control today, patient has been out of his medications Resume low-dose lisinopril and Coreg Follow-up in one month Check BMP today

## 2016-05-22 NOTE — Progress Notes (Signed)
Subjective:   Dade Rodin is a 61 y.o. male with a history of T2DM, HTN, HFpEF, polyarthralgia, constipation here for T2DM, HTN f/u and constipation. Swahili interpreter Imran  [phone]   T2DM - Checking BG at home: no - Medications: Lantus 15 units daily, metformin 1000 mg twice a day - Compliance: good - Diet: eats oatmeal, fufu, chicken everyday, no fruits/veggies - Exercise: none - eye exam: had appt late in 2017 for cataracts at [facility] - patient to schedule f/u - foot exam: Completed 08/26/15 - microalbumin: n/a ACEi - denies symptoms of hypoglycemia, polyuria, polydipsia, numbness extremities, foot ulcers/trauma  - A1c has improved from 8 in 08/2015 to 7.3 today (was previously 13)  HTN: - Medications: Coreg 3.125 mg twice a day, lisinopril 2.5 mg daily - Compliance: does not have coreg with him, lisinopril bottle is empty - Checking BP at home: no - Denies any SOB, CP, vision changes, LE edema, medication SEs, or symptoms of hypotension  Constipation - reports he tries and tries to have BM and cannot - eats same meals everyday - fufu, chicken, oatmeal - wonders if diet is related to constipation - does not eat any vegetables or much fruit - taking miralax 34g in water daily  - normal colonoscopy in 09/2015  Review of Systems:  Per HPI.   Social History: Never smoker  Objective:  BP 130/85 (BP Location: Right Arm, Patient Position: Sitting, Cuff Size: Normal)   Pulse 68   Temp 97.8 F (36.6 C) (Oral)   Ht 5\' 5"  (1.651 m)   Wt 175 lb 6.4 oz (79.6 kg)   SpO2 98%   BMI 29.19 kg/m   Gen:  61 y.o. male in NAD HEENT: NCAT, MMM, EOMI, PERRL, anicteric sclerae CV: RRR, no MRG Resp: Non-labored, CTAB, no wheezes noted Abd: Soft, NTND, BS present, no guarding or organomegaly Ext: WWP, no edema MSK: No obvious deformities, gait intact Neuro: Alert and oriented, speech normal       Chemistry      Component Value Date/Time   NA 137 08/26/2015 1103   K  4.7 08/26/2015 1103   CL 105 08/26/2015 1103   CO2 24 08/26/2015 1103   BUN 16 08/26/2015 1103   CREATININE 0.91 08/26/2015 1103      Component Value Date/Time   CALCIUM 8.7 08/26/2015 1103   ALKPHOS 72 06/21/2015 2034   AST 11 (L) 06/21/2015 2034   ALT 13 (L) 06/21/2015 2034   BILITOT 1.7 (H) 06/21/2015 2034      Lab Results  Component Value Date   WBC 7.4 06/21/2015   HGB 15.7 06/21/2015   HCT 44.7 06/21/2015   MCV 78.3 06/21/2015   PLT 226 06/21/2015   Lab Results  Component Value Date   TSH 0.925 06/22/2015   Lab Results  Component Value Date   HGBA1C 7.3 05/22/2016   Assessment & Plan:     Zaire Levesque is a 61 y.o. male here for   HTN (hypertension) Borderline control today, patient has been out of his medications Resume low-dose lisinopril and Coreg Follow-up in one month Check BMP today  T2DM (type 2 diabetes mellitus) (HCC) Control is improving with A1c of 7.3 Increase Lantus to 18 units daily Continue metformin Discussed hypoglycemia precautions Follow-up in one month  Constipation Unchanged Discussed in depth about high-fiber diet Discussed increasing Miralax to 17 g 3 times a day Resume senokot-s BID Reassured by normal colonoscopy recently Can consider GI f/u if not improving at  f/u  Screening Hep C at this visit   Erasmo DownerAngela M Bryana Froemming, MD MPH PGY-3,  [facility] 05/22/2016  10:33 AM

## 2016-05-22 NOTE — Assessment & Plan Note (Signed)
Unchanged Discussed in depth about high-fiber diet Discussed increasing Miralax to 17 g 3 times a day Resume senokot-s BID Reassured by normal colonoscopy recently Can consider GI f/u if not improving at f/u

## 2016-05-23 LAB — HEPATITIS C ANTIBODY: HCV Ab: NEGATIVE

## 2016-06-05 NOTE — Congregational Nurse Program (Signed)
Congregational Nurse Program Note  Date of Encounter: 06/05/2016  Past Medical History: Past Medical History:  Diagnosis Date  . Alcohol abuse   . Diabetes mellitus without complication [facility](HCC)     Encounter Details:     CNP Questionnaire - 06/05/16 1731      Patient Demographics   Is this a new or existing patient? New   Patient is considered a/an Refugee   Race African     Patient Assistance   Location of Patient Assistance [place]   Patient's financial/insurance status Medicare   Uninsured Patient ([place] Card/Care Connects) No   Patient referred to apply for the following financial assistance Not Applicable   Food insecurities addressed Not Applicable   Transportation assistance No   Assistance securing medications Yes   Type of Assistance Other   Educational health offerings Medications;Hypertension;Safety     Encounter Details   Primary purpose of visit Education/Health Concerns;Safety   Was an Emergency Department visit averted? Not Applicable   Does patient have a medical provider? Yes   Patient referred to Not Applicable   Was a mental health screening completed? (GAINS tool) No   Does patient have dental issues? No   Does patient have vision issues? No   Does your patient have an abnormal blood pressure today? No   Since previous encounter, have you referred patient for abnormal blood pressure that resulted in a new diagnosis or medication change? No   Does your patient have an abnormal blood glucose today? No   Since previous encounter, have you referred patient for abnormal blood glucose that resulted in a new diagnosis or medication change? No         Clinical Intake - 05/22/16 0957      Pre-visit preparation   Pre-visit preparation completed Yes     Nutrition Screen   Diabetes Yes     Abuse/Neglect   Do you feel unsafe in your current relationship? No   Do you feel physically threatened by others? No   Anyone hurting you at home, work, or  school? No   Unable to ask? No     Patient Literacy   How often do you need to have someone help you when you read instructions, pamphlets, or other written materials from your doctor or pharmacy? 5 - Always     Language Assistant   Interpreter Needed? Yes   Interpreter Agency [employer]   Interpreter Name Imran   Interpreter ID [id number]   Patient Declined Interpreter  No   Patient signed [place] waiver No     Helped client with his medications and  Checked his BP. His machine was not functioning well so he is  going to ask his Child psychotherapistsocial worker about another machine [id number] Market researcherN BSN [employer]

## 2016-06-18 ENCOUNTER — Other Ambulatory Visit: Payer: Self-pay | Admitting: Student

## 2016-06-26 ENCOUNTER — Telehealth: Payer: Self-pay

## 2016-06-26 NOTE — Telephone Encounter (Signed)
Client Mr Randy Fisher in office requesting assistance with obtaining immunuzation records for his children Randy Fisher and Randy Fisher. Telephone call made to [facility] and client was advised to go to the office to sign and pick the records. Dorothy Muhoro RN/ [employer]. [phone]

## 2016-08-06 DIAGNOSIS — Z794 Long term (current) use of insulin: Principal | ICD-10-CM

## 2016-08-06 DIAGNOSIS — E119 Type 2 diabetes mellitus without complications: Secondary | ICD-10-CM

## 2016-08-06 LAB — GLUCOSE, POCT (MANUAL RESULT ENTRY): POC Glucose: 227 mg/dl — AB (ref 70–99)

## 2016-08-06 NOTE — Congregational Nurse Program (Signed)
Congregational Nurse Program Note  Date of Encounter: 08/06/2016  Past Medical History: Past Medical History:  Diagnosis Date  . Alcohol abuse   . Diabetes mellitus without complication [facility](HCC)     Encounter Details:     CNP Questionnaire - 08/06/16 1231      Patient Demographics   Is this a new or existing patient? Existing   Patient is considered a/an [place]e   Race African     Patient Assistance   Food insecurities addressed Not Applicable   Transportation assistance No   Assistance securing medications No      Patient came to the clinic requesting for blood pressure check and blood sugar check.Same done.Health education provided regarding heart healthy diet and exercise. Nicole Cellaorothy Isiac Breighner RN BSN PCCN CNP [phone]

## 2016-08-20 ENCOUNTER — Telehealth: Payer: Self-pay

## 2016-08-20 DIAGNOSIS — R739 Hyperglycemia, unspecified: Secondary | ICD-10-CM

## 2016-08-20 NOTE — Telephone Encounter (Signed)
Client came to the center requesting assistance to make telephone call to the doctors office for appointment. Same done. Appointment scheduled for June 19th at 1030 am. Nicole CellaDorothy Fariha Goto RN,BSN [facility] CNP

## 2016-08-20 NOTE — Congregational Nurse Program (Signed)
Congregational Nurse Program Note  Date of Encounter: 08/20/2016  Past Medical History: Past Medical History:  Diagnosis Date  . Alcohol abuse   . Diabetes mellitus without complication [facility](HCC)     Encounter Details:     CNP Questionnaire - 08/20/16 1100      Patient Demographics   Is this a new or existing patient? Existing   Patient is considered a/an Refugee   Race African     Patient Assistance   Location of Patient Assistance [place]   Patient's financial/insurance status Medicaid   Uninsured Patient ([place] Card/Care Connects) No   Patient referred to apply for the following financial assistance Not Applicable   Food insecurities addressed Not Applicable   Transportation assistance No   Assistance securing medications No   Educational health offerings Medications;Hypertension;Safety;Health literacy;Diabetes;Chronic disease;Cardiac disease;Other  client educated on how to take and record blood pressure rea     Encounter Details   Primary purpose of visit Other  assist making doctors appointment   Was an Emergency Department visit averted? Not Applicable   Does patient have a medical provider? Yes   Patient referred to Not Applicable   Was a mental health screening completed? (GAINS tool) No   Does patient have dental issues? No   Does patient have vision issues? No   Does your patient have an abnormal blood pressure today? Yes   Since previous encounter, have you referred patient for abnormal blood pressure that resulted in a new diagnosis or medication change? Yes   Does your patient have an abnormal blood glucose today? No   Since previous encounter, have you referred patient for abnormal blood glucose that resulted in a new diagnosis or medication change? No   Was there a life-saving intervention made? No

## 2016-08-21 LAB — GLUCOSE, POCT (MANUAL RESULT ENTRY): POC GLUCOSE: 122 mg/dL — AB (ref 70–99)

## 2016-09-04 ENCOUNTER — Encounter: Payer: Self-pay | Admitting: Family Medicine

## 2016-09-04 ENCOUNTER — Ambulatory Visit (INDEPENDENT_AMBULATORY_CARE_PROVIDER_SITE_OTHER): Payer: Medicaid Other | Admitting: Family Medicine

## 2016-09-04 VITALS — BP 150/80 | HR 78 | Temp 98.3°F | Ht 65.0 in | Wt 175.0 lb

## 2016-09-04 DIAGNOSIS — E119 Type 2 diabetes mellitus without complications: Secondary | ICD-10-CM | POA: Diagnosis present

## 2016-09-04 DIAGNOSIS — M79642 Pain in left hand: Secondary | ICD-10-CM | POA: Diagnosis not present

## 2016-09-04 DIAGNOSIS — I10 Essential (primary) hypertension: Secondary | ICD-10-CM

## 2016-09-04 LAB — POCT SEDIMENTATION RATE: POCT SED RATE: 5 mm/h (ref 0–22)

## 2016-09-04 LAB — POCT GLYCOSYLATED HEMOGLOBIN (HGB A1C): Hemoglobin A1C: 7.3

## 2016-09-04 MED ORDER — NAPROXEN 500 MG PO TABS: 500.0000 mg | ORAL_TABLET | Freq: Two times a day (BID) | ORAL | 0 refills | Status: DC

## 2016-09-04 MED ORDER — INSULIN GLARGINE 100 UNIT/ML ~~LOC~~ SOLN: 20.0000 [IU] | Freq: Every day | SUBCUTANEOUS | 2 refills | Status: DC

## 2016-09-04 NOTE — Assessment & Plan Note (Signed)
Above goal, but has not taken meds yet today No changes at this time F/u in 1 month

## 2016-09-04 NOTE — Assessment & Plan Note (Signed)
Some concern for rheumatologic process given stiffness, swelling and previous polyarthralgias Check ANA and RF and ESR XR of L hand to evaluate for arthritic changes Could consider rheum referral in the future

## 2016-09-04 NOTE — Progress Notes (Signed)
Subjective:   Randy Fisher is a 61 y.o. male with a history of T2 DM, HTN, diastolic heart failure, constipation here for T2 DM, HTN follow-up  Swahili interpreter [phone]  T2DM - Checking BG at home: no - Medications: Lantus 18 units daily, Metformin 1017m BID - Compliance: good, but did not take this morning, patient cannot tell me how many units of insulin he is using and interpreter does not know what insulin is - Diet: fufu, chicken, no veggies/fruits - Exercise: none - [facility] exam: [facility] -needs f/u appt - foot exam: needs today - microalbumin: n/a ACEi - denies symptoms of hypoglycemia, polyuria, polydipsia, numbness extremities, foot ulcers/trauma - A1c 7.3 in 05/2016 > 7.3 today  HTN: - Medications: coreg 3.1273mBID, lisinopril 2.29m44maily - Compliance: good but did not take this AM - Checking BP at home: no - Denies any SOB, CP, vision changes, LE edema, medication SEs, or symptoms of hypotension - Diet/Exercise: as above  L hand pain - sometimes not able to hold on to things Hurting for 3 months Intermittent Swells sometimes Worse after sleeping on it Hurts in 2-4 fingers in entire finger and metacarpals hasnt tried any medications No numbness/weakness R hand dominant   Review of Systems:  Per HPI.   Social History: never smoker  Objective:  BP (!) 150/80   Pulse 78   Temp 98.3 F (36.8 C) (Oral)   Ht '5\' 5"'  (1.651 m)   Wt 175 lb (79.4 kg)   SpO2 98%   BMI 29.12 kg/m   Gen:  [age]. male in NAD HEENT: NCAT, MMM, anicteric sclerae CV: RRR, no MRG Resp: Non-labored, CTAB, no wheezes noted Ext: WWP, no edema MSK: L hand: Inspection normal with exception of swelling of MTPs, PIPs and DIPs of fingers 2-4. ROM full, but patient hesitant to participate due to pain. Palpation is normal over metacarpals, navicular, lunate, and TFCC; tendons without tenderness/ swelling Unable to participate in strength testing due to  pain Negative Finkelstein, tinel's and phalens. Neuro: Alert and oriented, speech normal       Chemistry      Component Value Date/Time   NA 140 05/22/2016 1046   K 4.8 05/22/2016 1046   CL 103 05/22/2016 1046   CO2 31 05/22/2016 1046   BUN 12 05/22/2016 1046   CREATININE 0.75 05/22/2016 1046      Component Value Date/Time   CALCIUM 9.3 05/22/2016 1046   ALKPHOS 72 06/21/2015 2034   AST 11 (L) 06/21/2015 2034   ALT 13 (L) 06/21/2015 2034   BILITOT 1.7 (H) 06/21/2015 2034      Lab Results  Component Value Date   WBC 7.4 06/21/2015   HGB 15.7 06/21/2015   HCT 44.7 06/21/2015   MCV 78.3 06/21/2015   PLT 226 06/21/2015   Lab Results  Component Value Date   TSH 0.925 06/22/2015   Lab Results  Component Value Date   HGBA1C 7.3 09/04/2016   Assessment & Plan:     Randy Fisher a 61 56o. male here for   T2DM (type 2 diabetes mellitus) (HCCStella1c remains 7.3 Goal a1c <7 Continue metformin Increase Lantus to 20 units daily Discussed hypoglycemia precautions Foot exam completed today Needs vaccinations at next visit Follow-up in 1 month  HTN (hypertension) Above goal, but has not taken meds yet today No changes at this time F/u in 1 month  Left hand pain Some concern for rheumatologic process  given stiffness, swelling and previous polyarthralgias Check ANA and RF and ESR XR of L hand to evaluate for arthritic changes Could consider rheum referral in the future   Randy Fisher, Dionne Bucy, MD MPH PGY-3,  [facility] 09/04/2016  12:09 PM

## 2016-09-04 NOTE — Assessment & Plan Note (Signed)
A1c remains 7.3 Goal a1c <7 Continue metformin Increase Lantus to 20 units daily Discussed hypoglycemia precautions Foot exam completed today Needs vaccinations at next visit Follow-up in 1 month

## 2016-09-04 NOTE — Patient Instructions (Signed)
Lantus insulin 20 units daily  Lab work, Personal assistantXray, and medicine for hand pain.

## 2016-09-05 LAB — RHEUMATOID FACTOR: Rhuematoid fact SerPl-aCnc: <10 IU/mL (ref 0.0–13.9)

## 2016-09-05 LAB — ANA: Anti Nuclear Antibody(ANA): NEGATIVE

## 2016-09-06 ENCOUNTER — Encounter: Payer: Self-pay | Admitting: Family Medicine

## 2016-10-02 ENCOUNTER — Other Ambulatory Visit: Payer: Self-pay | Admitting: Family Medicine

## 2016-10-02 DIAGNOSIS — K5900 Constipation, unspecified: Secondary | ICD-10-CM

## 2016-10-02 DIAGNOSIS — E119 Type 2 diabetes mellitus without complications: Secondary | ICD-10-CM

## 2016-10-08 ENCOUNTER — Emergency Department (HOSPITAL_COMMUNITY)
Admission: EM | Admit: 2016-10-08 | Discharge: 2016-10-08 | Disposition: A | Discharge status: Home / Self Care | Payer: Medicaid Other | Attending: Emergency Medicine | Admitting: Emergency Medicine

## 2016-10-08 ENCOUNTER — Encounter (HOSPITAL_COMMUNITY): Payer: Self-pay | Admitting: *Deleted

## 2016-10-08 DIAGNOSIS — Z794 Long term (current) use of insulin: Secondary | ICD-10-CM | POA: Diagnosis not present

## 2016-10-08 DIAGNOSIS — F1010 Alcohol abuse, uncomplicated: Secondary | ICD-10-CM | POA: Insufficient documentation

## 2016-10-08 DIAGNOSIS — Z7982 Long term (current) use of aspirin: Secondary | ICD-10-CM | POA: Insufficient documentation

## 2016-10-08 DIAGNOSIS — R739 Hyperglycemia, unspecified: Secondary | ICD-10-CM

## 2016-10-08 DIAGNOSIS — I509 Heart failure, unspecified: Secondary | ICD-10-CM | POA: Insufficient documentation

## 2016-10-08 DIAGNOSIS — I110 Hypertensive heart disease with heart failure: Secondary | ICD-10-CM | POA: Insufficient documentation

## 2016-10-08 DIAGNOSIS — E1165 Type 2 diabetes mellitus with hyperglycemia: Secondary | ICD-10-CM | POA: Diagnosis not present

## 2016-10-08 DIAGNOSIS — K047 Periapical abscess without sinus: Secondary | ICD-10-CM

## 2016-10-08 DIAGNOSIS — R6884 Jaw pain: Secondary | ICD-10-CM | POA: Diagnosis present

## 2016-10-08 DIAGNOSIS — Z79899 Other long term (current) drug therapy: Secondary | ICD-10-CM | POA: Insufficient documentation

## 2016-10-08 DIAGNOSIS — I10 Essential (primary) hypertension: Secondary | ICD-10-CM | POA: Diagnosis not present

## 2016-10-08 HISTORY — DX: Essential (primary) hypertension: I10

## 2016-10-08 LAB — COMPREHENSIVE METABOLIC PANEL
ALT: 48 U/L (ref 17–63)
AST: 21 U/L (ref 15–41)
Albumin: 3.9 g/dL (ref 3.5–5.0)
Alkaline Phosphatase: 94 U/L (ref 38–126)
Anion gap: 10 (ref 5–15)
BUN: 12 mg/dL (ref 6–20)
CHLORIDE: 98 mmol/L — AB (ref 101–111)
CO2: 25 mmol/L (ref 22–32)
CREATININE: 0.8 mg/dL (ref 0.61–1.24)
Calcium: 9.1 mg/dL (ref 8.9–10.3)
GFR calc non Af Amer: >60 mL/min (ref >60)
Glucose, Bld: 288 mg/dL — ABNORMAL HIGH (ref 65–99)
POTASSIUM: 4.4 mmol/L (ref 3.5–5.1)
SODIUM: 133 mmol/L — AB (ref 135–145)
Total Bilirubin: 0.9 mg/dL (ref 0.3–1.2)
Total Protein: 7.5 g/dL (ref 6.5–8.1)

## 2016-10-08 LAB — CBC
HEMATOCRIT: 42.6 % (ref 39.0–52.0)
Hemoglobin: 15.1 g/dL (ref 13.0–17.0)
MCH: 28.3 pg (ref 26.0–34.0)
MCHC: 35.4 g/dL (ref 30.0–36.0)
MCV: 79.8 fL (ref 78.0–100.0)
PLATELETS: 190 10*3/uL (ref 150–400)
RBC: 5.34 MIL/uL (ref 4.22–5.81)
RDW: 13 % (ref 11.5–15.5)
WBC: 6.9 10*3/uL (ref 4.0–10.5)

## 2016-10-08 LAB — CBG MONITORING, ED: Glucose-Capillary: 304 mg/dL — ABNORMAL HIGH (ref 65–99)

## 2016-10-08 LAB — LIPASE, BLOOD: Lipase: 37 U/L (ref 11–51)

## 2016-10-08 MED ORDER — AMOXICILLIN 500 MG PO CAPS: 1000.0000 mg | ORAL_CAPSULE | Freq: Two times a day (BID) | ORAL | 0 refills | Status: DC

## 2016-10-08 MED ORDER — HYDROCODONE-ACETAMINOPHEN 5-325 MG PO TABS: ORAL_TABLET | ORAL | 0 refills | Status: DC

## 2016-10-08 MED ORDER — HYDROCODONE-ACETAMINOPHEN 5-325 MG PO TABS
1.0000 | ORAL_TABLET | Freq: Once | ORAL | Status: AC
  Administered 2016-10-08: 1 via ORAL
  Filled 2016-10-08: qty 1

## 2016-10-08 MED ORDER — AMOXICILLIN 500 MG PO CAPS
1000.0000 mg | ORAL_CAPSULE | Freq: Once | ORAL | Status: AC
  Administered 2016-10-08: 1000 mg via ORAL
  Filled 2016-10-08: qty 2

## 2016-10-08 NOTE — ED Triage Notes (Addendum)
Pt speaks swahili and using video interpreter. Pt is here for his blood sugar concerns of 200, abdominal pain with constipation, and right upper tooth pain. Pt reports pain in hands. Pt also reports dizziness.

## 2016-10-08 NOTE — Discharge Instructions (Signed)
[place] vicodin kwa maumivu ya mafanikio, usinywe pombe, [place] Arab Jamahiriyakuendesha gari, Algeriakuwahudumia watoto au Romaniakufanya kazi nyingine muhimu wakati unachukua vicodin.   Rudi Lockheed Martinkwenye chumba cha dharura kwa homa, mabadiliko Rentzkatika maono, upeo wa uso ambao huenea kwa haraka kwa jicho, kichefuchefu au St. James[place]utapika, shida Bahamaskumeza au kupunguzwa kwa pumzi.   [place] antibiotics yako kama ilivyoelekezwa na mwisho wa kozi. Antibiotics hizi zinaweza kusababisha kiasi kidogo [place]cha kuharisha. Jena GaussUsiacha kuwatumia ikiwa Jaye Beagleunakuza kuhara.  Kufuatilia na daktari wa meno ni muhimu sana kwa ajili ya tathmini inayoendelea na usimamizi wa maumivu ya kawaida ya meno. Rudi kwenye idara ya dharura kwa kubadilisha au kuzidi dalili.  Take vicodin for breakthrough pain, do not drink alcohol, drive, care for children or do other critical tasks while taking vicodin.   Return to the emergency room for fever, change in vision, redness to the face that rapidly spreads towards the eye, nausea or vomiting, difficulty swallowing or shortness of breath.   Take your antibiotics as directed and to the end of the course. These antibiotics may cause a small amount of diarrhea. Do not stop taking them if you develop diarrhea.  Followup with a dentist is very important for ongoing evaluation and management of recurrent dental pain. Return to emergency department for changing or worsening symptoms.

## 2016-10-08 NOTE — ED Provider Notes (Signed)
[facility]-EMERGENCY DEPT Provider Note   CSN: [id number] Arrival date & time: 10/08/16  1120     History   Chief Complaint Chief Complaint  Patient presents with  . Abdominal Pain  . Dental Pain     HPI  Blood pressure 129/90, pulse 84, temperature 98.1 F (36.7 C), temperature source Oral, resp. rate 20, SpO2 99 %.  Randy Fisher is a 61 y.o. male with past medical history significant for hypertension, insulin and diabetes and alcohol abuse complaining of ill fitting partial denture and pain on the right upper jaw worsening over the course of several days. He states that swallowing has been painful and he hasn't been eating and drinking normally therefore he held all of his medication including insulin over the past several days. He checked his blood sugar this morning it was 288. Patient denies any abdominal pain, nausea, vomiting, change in bowel or bladder habits although the triage note says abdominal pain I've checked this with the interpreter multiple times he continuously denies this. The issue he is having with his stomach is that he is sensitive to certain foods. Pain is severe, has taken no pain medication. He is worried that his blood pressure was elevated, this morning it was in the 170s systolic however he hasn't taken his high blood pressure medication.  Video swahili interpreter used for communication.  Past Medical History:  Diagnosis Date  . Alcohol abuse   . Diabetes mellitus without complication (HCC)   . Hypertension     Patient Active Problem List   Diagnosis Date Noted  . Left hand pain 09/04/2016  . Left upper extremity numbness 12/21/2015  . Elbow pain 11/30/2015  . Achilles tendinosis 09/19/2015  . Cataract 07/04/2015  . HTN (hypertension) 06/22/2015  . Constipation 03/22/2015  . CHF (congestive heart failure) (HCC) 12/31/2014  . Language barrier 10/11/2014  . T2DM (type 2 diabetes mellitus) (HCC) 08/26/2014  . History of alcohol abuse 08/26/2014  .  Knee pain, bilateral 08/26/2014    Past Surgical History:  Procedure Laterality Date  . CATARACT EXTRACTION Right 09/22/15  . TUMOR REMOVAL Left March 2017   upper arm       Home Medications    Prior to Admission medications   Medication Sig Start Date End Date Taking? Authorizing Provider  ACCU-CHEK SOFTCLIX LANCETS lancets use to check blood sugar in the morning before breakfast, 2 hours after lunch and dinner 06/18/16   [name], MD  amoxicillin (AMOXIL) 500 MG capsule Take 2 capsules (1,000 mg total) by mouth 2 (two) times daily. 10/08/16   Lovie Agresta, Joni Reining, PA-C  aspirin 81 MG EC tablet Take 1 tablet (81 mg total) by mouth daily. 05/22/16   [name], MD  atorvastatin (LIPITOR) 40 MG tablet Take 1 tablet by mouth daily at 6 pm 05/22/16   [name], MD  carvedilol (COREG) 3.125 MG tablet Take 1 tablet (3.125 mg total) by mouth 2 (two) times daily. 05/22/16   [name], MD  EASY COMFORT INSULIN SYRINGE 30G X 5/16" 1 ML MISC use to inject insulin up to THREE TIMES DAILY 10/02/16   Almon Hercules, MD  gabapentin (NEURONTIN) 300 MG capsule Take 1 capsule (300 mg total) by mouth 3 (three) times daily. 05/22/16   [name], MD  glucose blood (ACCU-CHEK AVIVA) test strip Use to check your blood sugar in the morning before breakfast, 2 hours after lunch and dinner 04/19/16   [name], MD  HYDROcodone-acetaminophen (NORCO/VICODIN)  5-325 MG tablet Take 1-2 tablets by mouth every 6 hours as needed for pain and/or cough. 10/08/16   Rella Egelston, Joni Reining, PA-C  insulin glargine (LANTUS) 100 UNIT/ML injection Inject 0.2 mLs (20 Units total) into the skin daily after breakfast. 09/04/16   Bacigalupo, Marzella Schlein, MD  Insulin Syringe-Needle U-100 29G X 7/16" 1 ML MISC Use to inject insulin once daily 07/04/15   Latrelle Dodrill, MD  Lancet Devices (AUTOLET LANCING DEVICE) MISC use to check blood sugar in the morning before breakfast, 2 hours after lunch  and dinner 07/13/15   [name], MD  lisinopril (PRINIVIL,ZESTRIL) 5 MG tablet take half a TABLET BY MOUTH EVERY DAY 05/22/16   [name], MD  metFORMIN (GLUCOPHAGE) 1000 MG tablet Take 1 tablet (1,000 mg total) by mouth 2 (two) times daily with a meal. 05/22/16   Bacigalupo, Marzella Schlein, MD  Misc. Devices (DIGITAL GLASS SCALE) MISC Check weight daily 07/13/15   Latrelle Dodrill, MD  naproxen (NAPROSYN) 500 MG tablet Take 1 tablet (500 mg total) by mouth 2 (two) times daily with a meal. 09/04/16   Bacigalupo, Marzella Schlein, MD  polyethylene glycol powder (GLYCOLAX/MIRALAX) powder Take 17 g by mouth 3 (three) times daily. 05/22/16   [name], MD  Probiotic Product (PROBIOTIC DAILY PO) Take 1 tablet by mouth 2 (two) times daily.    [provider]  senna-docusate (SENOKOT-S) 8.6-50 MG tablet Take 1 tablet by mouth 2 (two) times daily. 05/22/16   Bacigalupo, Marzella Schlein, MD  traMADol (ULTRAM) 50 MG tablet Take 1 tablet (50 mg total) by mouth every 12 (twelve) hours as needed. 12/14/15   Araceli Bouche, DO    Family History Family History  Problem Relation Age of Onset  . Family history unknown: Yes    Social History Social History  Substance Use Topics  . Smoking status: Never Smoker  . Smokeless tobacco: Never Used  . Alcohol use No     Allergies   Patient has no known allergies.   Review of Systems Review of Systems  A complete review of systems was obtained and all systems are negative except as noted in the HPI and PMH.    Physical Exam Updated Vital Signs BP (!) 123/92   Pulse 72   Temp 98.1 F (36.7 C) (Oral)   Resp 16   SpO2 100%   Physical Exam  Constitutional: He is oriented to person, place, and time. He appears well-developed and well-nourished. No distress.  HENT:  Head: Normocephalic and atraumatic.  Mouth/Throat: Oropharynx is clear and moist.    Generally poor dentition with deep dental caries. Patient has fluctuant abscess as  diagrammed, swelling visible from on the face but with with no facial cellulitis. Patient is handling their secretions. There is no tenderness to palpation or firmness underneath tongue bilaterally. No trismus.      Eyes: Pupils are equal, round, and reactive to light. Conjunctivae and EOM are normal.  Neck: Normal range of motion.  Cardiovascular: Normal rate, regular rhythm and intact distal pulses.   Pulmonary/Chest: Effort normal and breath sounds normal.  Abdominal: Soft. There is no tenderness.  Musculoskeletal: Normal range of motion.  Neurological: He is alert and oriented to person, place, and time.  Skin: He is not diaphoretic.  Psychiatric: He has a normal mood and affect.  Nursing note and vitals reviewed.    ED Treatments / Results  Labs (all labs ordered are listed, but only abnormal results are displayed)  Labs Reviewed  COMPREHENSIVE METABOLIC PANEL - Abnormal; Notable for the following:       Result Value   Sodium 133 (*)    Chloride 98 (*)    Glucose, Bld 288 (*)    All other components within normal limits  CBG MONITORING, ED - Abnormal; Notable for the following:    Glucose-Capillary 304 (*)    All other components within normal limits  LIPASE, BLOOD  CBC    EKG  EKG Interpretation None       Radiology No results found.  Procedures Procedures (including critical care time)  Medications Ordered in ED Medications  amoxicillin (AMOXIL) capsule 1,000 mg (1,000 mg Oral Given 10/08/16 1752)  HYDROcodone-acetaminophen (NORCO/VICODIN) 5-325 MG per tablet 1 tablet (1 tablet Oral Given 10/08/16 1752)     Initial Impression / Assessment and Plan / ED Course  I have reviewed the triage vital signs and the nursing notes.  Pertinent labs & imaging results that were available during my care of the patient were reviewed by me and considered in my medical decision making (see chart for details).     Vitals:   10/08/16 1157 10/08/16 1800 10/08/16 1804  BP:  129/90  (!) 123/92  Pulse: 84 72   Resp: 20 16   Temp: 98.1 F (36.7 C) 98.1 F (36.7 C)   TempSrc: Oral Oral   SpO2: 99% 100%     Medications  amoxicillin (AMOXIL) capsule 1,000 mg (1,000 mg Oral Given 10/08/16 1752)  HYDROcodone-acetaminophen (NORCO/VICODIN) 5-325 MG per tablet 1 tablet (1 tablet Oral Given 10/08/16 1752)    Randy Fisher is 61 y.o. male presenting with Ill fitting partial denture with dental abscess. Patient is insulin-dependent diabetic and has hypertension however he self DC'd all medication several days ago because he states he wasn't eating and drinking normally. Blood sugar is elevated today at 288 however he has a normal anion gap, he reports an elevated blood pressure this morning but this has normalized. This does not appear to be a deep space infection. Will start him on amoxicillin. I've advised patient to follow with his dentist in 7-10 days. With had an extensive discussion of return precautions patient verbalizes understanding. Repeat abdominal exam remains benign. I've advised him to stop using the partial denture because I do think that that is irritating his gums. Advised him to check his blood sugar regularly and resume metformin  Evaluation does not show pathology that would require ongoing emergent intervention or inpatient treatment. Pt is hemodynamically stable and mentating appropriately. Discussed findings and plan with patient/guardian, who agrees with care plan. All questions answered. Return precautions discussed and outpatient follow up given.      Final Clinical Impressions(s) / ED Diagnoses   Final diagnoses:  Dental abscess  Hyperglycemia without ketosis    New Prescriptions Discharge Medication List as of 10/08/2016  5:46 PM    START taking these medications   Details  amoxicillin (AMOXIL) 500 MG capsule Take 2 capsules (1,000 mg total) by mouth 2 (two) times daily., Starting Mon 10/08/2016, Print    HYDROcodone-acetaminophen  (NORCO/VICODIN) 5-325 MG tablet Take 1-2 tablets by mouth every 6 hours as needed for pain and/or cough., Print         Tameshia Bonneville, Mardella Laymanicole, PA-C 10/08/16 2031    Benjiman CorePickering, Nathan, MD 10/08/16 [id number]

## 2016-10-17 NOTE — Congregational Nurse Program (Signed)
Congregational Nurse Program Note  Date of Encounter: 10/17/2016  Past Medical History: Past Medical History:  Diagnosis Date  . Alcohol abuse   . Diabetes mellitus without complication (HCC)   . Hypertension     Encounter Details:     CNP Questionnaire - 10/17/16 1141      Patient Demographics   Is this a new or existing patient? Existing   Patient is considered a/an Refugee   Race African     Patient Assistance   Location of Patient Assistance Not Applicable   Patient's financial/insurance status Medicaid   Uninsured Patient ([place] Card/Care Connects) No   Patient referred to apply for the following financial assistance Not Applicable   Food insecurities addressed Not Applicable   Transportation assistance No   Assistance securing medications No   Educational health offerings Medications;Hypertension;Health literacy;Diabetes;Chronic disease;Cardiac disease;Other;Nutrition     Encounter Details   Primary purpose of visit Other;Chronic Illness/Condition Visit   Was an Emergency Department visit averted? Not Applicable   Does patient have a medical provider? Yes   Patient referred to Follow up with established PCP   Was a mental health screening completed? (GAINS tool) No   Does patient have dental issues? No   Does patient have vision issues? No   Does your patient have an abnormal blood pressure today? No   Since previous encounter, have you referred patient for abnormal blood pressure that resulted in a new diagnosis or medication change? No   Does your patient have an abnormal blood glucose today? Yes   Since previous encounter, have you referred patient for abnormal blood glucose that resulted in a new diagnosis or medication change? No   Was there a life-saving intervention made? No    Brief office visit [place] and requesting blood sugar check. Feeling dizzy. Admits to not eating and no meds taken. Blood pressure reading normal  and slightly lower than usual. CBG 246. Advised regarding importance of medications and not skipping meals. Return on 10/22/16 to review diet, food availability in home and access to meds.  Refer to PCP as needed. [facility] CorinthMarietta Aarron Wierzbicki,RN/CN

## 2016-10-17 NOTE — Congregational Nurse Program (Signed)
Congregational Nurse Program Note  Date of Encounter: 10/04/2016  Past Medical History: Past Medical History:  Diagnosis Date  . Alcohol abuse   . Diabetes mellitus without complication (HCC)   . Hypertension     Encounter Details:     CNP Questionnaire - 10/17/16 1141      Patient Demographics   Is this a new or existing patient? Existing   Patient is considered a/an Refugee   Race African     Patient Assistance   Location of Patient Assistance Not Applicable   Patient's financial/insurance status Medicaid   Uninsured Patient ([place] Card/Care Connects) No   Patient referred to apply for the following financial assistance Not Applicable   Food insecurities addressed Not Applicable   Transportation assistance No   Assistance securing medications No   Educational health offerings Medications;Hypertension;Health literacy;Diabetes;Chronic disease;Cardiac disease;Other;Nutrition     Encounter Details   Primary purpose of visit Other;Chronic Illness/Condition Visit   Was an Emergency Department visit averted? Not Applicable   Does patient have a medical provider? Yes   Patient referred to Follow up with established PCP   Was a mental health screening completed? (GAINS tool) No   Does patient have dental issues? No   Does patient have vision issues? No   Does your patient have an abnormal blood pressure today? No   Since previous encounter, have you referred patient for abnormal blood pressure that resulted in a new diagnosis or medication change? No   Does your patient have an abnormal blood glucose today? Yes   Since previous encounter, have you referred patient for abnormal blood glucose that resulted in a new diagnosis or medication change? No   Was there a life-saving intervention made? No     Office visit to request B/P and blood sugar check. Reports feeing dizzy and experiencing visual changes. Responsive and answered questions appropriately. No food eaten today.  Skipped medications today. Review meds and return 1 week. Ferol LuzMarietta Arihaan Bellucci, RN/CN

## 2016-11-22 NOTE — Congregational Nurse Program (Signed)
Congregational Nurse Program Note  Date of Encounter: 11/01/2016  Past Medical History: Past Medical History:  Diagnosis Date  . Alcohol abuse   . Diabetes mellitus without complication (HCC)   . Hypertension     Encounter Details:     CNP Questionnaire - 11/01/16 1754      Patient Demographics   Is this a new or existing patient? Existing   Patient is considered a/an Refugee   Race African     Patient Assistance   Location of Patient Assistance Not Applicable   Patient's financial/insurance status Low Income;Medicaid   Uninsured Patient ([employer] officer, trade unionCard/Care Connects) No   Patient referred to apply for the following financial assistance Not Applicable   Food insecurities addressed Not Applicable   Transportation assistance No   Assistance securing medications No   Educational Designer, television/film sethealth offerings Behavioral health;Interpersonal relationships     Encounter Details   Primary purpose of visit Family/Caregiver Support;Safety   Was an Emergency Department visit averted? Not Applicable   Does patient have a medical provider? Yes   Patient referred to Not Applicable   Was a mental health screening completed? (GAINS tool) No   Does patient have dental issues? No   Does patient have vision issues? No   Was a vision referral made? No   Does your patient have an abnormal blood pressure today? No   Since previous encounter, have you referred patient for abnormal blood pressure that resulted in a new diagnosis or medication change? No   Does your patient have an abnormal blood glucose today? No   Since previous encounter, have you referred patient for abnormal blood glucose that resulted in a new diagnosis or medication change? No   Was there a life-saving intervention made? No     Team meeting with [employer] staff involved with client and son.  CN, Ferol LuzMarietta Douglas and the father joined the meeting.  An interpreter provided by the school was used to communicate with the client.   Client is agreeable to involving son in counseling.  I have notified [facility] and they are willing to take the client and son.  The school navigator will work with the therapist and client to establish an agreeable time for the appointment

## 2016-11-22 NOTE — Congregational Nurse Program (Signed)
Congregational Nurse Program Note  Date of Encounter: 10/18/2016  Past Medical History: Past Medical History:  Diagnosis Date  . Alcohol abuse   . Diabetes mellitus without complication ([place])   . Hypertension     Encounter Details:  Met with client after [employer] staff reported concerns about his children.  Shared concerns that his son was demonstrating signs of depression and staff were concerned about his daughter as well.  He was tearful and wanting help with parenting his children.  States the children did better when they were going to the community center where they previously lived.  Encouraged him to explore ways to re-engage his children at the center.

## 2016-11-22 NOTE — Congregational Nurse Program (Signed)
Congregational Nurse Program Note  Date of Encounter: 10/30/2016  Past Medical History: Past Medical History:  Diagnosis Date  . Alcohol abuse   . Diabetes mellitus without complication (HCC)   . Hypertension     Encounter Details:     CNP Questionnaire - 10/30/16 1749      Patient Demographics   Is this a new or existing patient? Existing   Patient is considered a/an Refugee   Race African     Patient Assistance   Location of Patient Assistance Not Applicable   Patient's financial/insurance status Low Income;Medicaid   Uninsured Patient ([employer] officer, trade unionCard/Care Connects) No   Patient referred to apply for the following financial assistance Not Applicable   Food insecurities addressed Not Applicable   Transportation assistance No   Assistance securing medications No   Educational Designer, television/film sethealth offerings Behavioral health;Interpersonal relationships     Encounter Details   Primary purpose of visit Family/Caregiver Support;Safety   Was an Emergency Department visit averted? Not Applicable   Does patient have a medical provider? Yes   Patient referred to Not Applicable   Was a mental health screening completed? (GAINS tool) No   Does patient have dental issues? No   Does patient have vision issues? No   Was a vision referral made? No   Does your patient have an abnormal blood pressure today? No   Since previous encounter, have you referred patient for abnormal blood pressure that resulted in a new diagnosis or medication change? No   Does your patient have an abnormal blood glucose today? No   Since previous encounter, have you referred patient for abnormal blood glucose that resulted in a new diagnosis or medication change? No   Was there a life-saving intervention made? No     Home visit.  Client's son was seen in the ED a few days ago after making a suicidal gesture at the [employer]  The son was outside playing with friends when I arrived.  Talked to the client with the  assistance of his daughter as an interpreter.  The daughter states she is excited about going back to school.  Client reports he thinks his son is doing better and that his son denies wanting to hurt himself.  Discussed with client need to follow up with counseling for his son.  He agreed.  Reviewed his discharge papers.  Informed client I would assist him in obtaining a counselor for his son.

## 2016-12-04 NOTE — Congregational Nurse Program (Unsigned)
Congregational Nurse Program Note  Date of Encounter: 12/04/2016  Past Medical History: Past Medical History:  Diagnosis Date  . Alcohol abuse   . Diabetes mellitus without complication (HCC)   . Hypertension     Encounter Details:     CNP Questionnaire - 12/04/16 1436      Patient Demographics   Is this a new or existing patient? Existing   Patient is considered a/an Refugee   Race African     Patient Assistance   Location of Patient Assistance Not Applicable   Patient's financial/insurance status Medicaid;Low Income   Patient referred to apply for the following financial assistance Not Applicable   Food insecurities addressed Not Applicable   Assistance securing medications Yes   Type of Assistance Other   Educational health offerings Medications;Nutrition;Navigating the healthcare system     Encounter Details   Primary purpose of visit Chronic Illness/Condition Visit;Spiritual Care/Support Visit   Was an Emergency Department visit averted? Not Applicable   Does patient have a medical provider? Yes   Patient referred to Follow up with established PCP   Was a mental health screening completed? (GAINS tool) No   Does patient have dental issues? No   Does patient have vision issues? No   Does your patient have an abnormal blood pressure today? Yes   Since previous encounter, have you referred patient for abnormal blood pressure that resulted in a new diagnosis or medication change? No   Does your patient have an abnormal blood glucose today? Yes   Since previous encounter, have you referred patient for abnormal blood glucose that resulted in a new diagnosis or medication change? No   Was there a life-saving intervention made? No     Office visit at [employer] to request blood sugar check. Out of lancets for home testing. Fasting this am. CBG 78.  Admits feeling weak. Brought orange to class for snack and ate hard candy.  Expressed concern about previous referral for  self and children to counseling. Did not receive appointment for family. Repeat blood sugar. Return 09/19 to review medications and diet. Weigh weekly at center. Refer to [facility] for family's counseling appointments. Follow-up PCP appointments as scheduled. Ferol Luz, RN/CN

## 2016-12-10 ENCOUNTER — Other Ambulatory Visit: Payer: Self-pay | Admitting: Student

## 2016-12-10 NOTE — Telephone Encounter (Signed)
Patient has run out of following meds: Metformin, and his other meds need to be refilled at [facility].  Please call patient on interpreter line to see which meds need refilling as he does not speak English and could not bring all the bottles.  # [phone]

## 2016-12-12 MED ORDER — LISINOPRIL 5 MG PO TABS: ORAL_TABLET | ORAL | 6 refills | Status: DC

## 2016-12-12 MED ORDER — GABAPENTIN 300 MG PO CAPS: 300.0000 mg | ORAL_CAPSULE | Freq: Three times a day (TID) | ORAL | 2 refills | Status: DC

## 2016-12-12 MED ORDER — METFORMIN HCL 1000 MG PO TABS: 1000.0000 mg | ORAL_TABLET | Freq: Two times a day (BID) | ORAL | 5 refills | Status: DC

## 2016-12-12 NOTE — Telephone Encounter (Signed)
Refilled his metformin, lisinopril and gabapentin. Thanks!

## 2016-12-12 NOTE — Congregational Nurse Program (Signed)
Congregational Nurse Program Note  Date of Encounter: 12/12/2016  Past Medical History: Past Medical History:  Diagnosis Date  . Alcohol abuse   . Diabetes mellitus without complication (HCC)   . Hypertension     Encounter Details:     CNP Questionnaire - 12/12/16 1008      Patient Demographics   Is this a new or existing patient? Existing   Patient is considered a/an Refugee   Race African     Patient Assistance   Location of Patient Assistance Not Applicable   Patient's financial/insurance status Medicaid;Low Income   Uninsured Patient ([employer] Research officer, trade union) No   Patient referred to apply for the following financial assistance Not Applicable   Food insecurities addressed Not Applicable   Transportation assistance No   Assistance securing medications Yes   Type of Assistance Other   Educational health offerings Diabetes;Nutrition;Medications     Encounter Details   Primary purpose of visit Chronic Illness/Condition Visit;Navigating the Healthcare System   Was an Emergency Department visit averted? Not Applicable   Does patient have a medical provider? Yes   Patient referred to Follow up with established PCP   Was a mental health screening completed? (GAINS tool) No   Does patient have dental issues? No   Does patient have vision issues? No   Does your patient have an abnormal blood pressure today? No   Since previous encounter, have you referred patient for abnormal blood pressure that resulted in a new diagnosis or medication change? No   Does your patient have an abnormal blood glucose today? Yes   Since previous encounter, have you referred patient for abnormal blood glucose that resulted in a new diagnosis or medication change? No   Was there a life-saving intervention made? No     Office visit at [facility] with concerns about medication refills for B/P and diabetes. Speaks  Herbalist for interview.  Medications, Metformin1000,  Lisinopril . Gabaprntin 300 mgm need refilling. Glucose test 153 taken today  at 7am and 268 non fasting at 10:08 am. Shared blood sugar log for month of September.  Overall condition looking good. [facility] notified regarding refills. Refills called into [employer] for home delivery. Instructed regarding use of pill box provided for days of week. Destroy old pill bottles. Continue recording on BS log and share with physician also. Return to nurse office prn. Ferol Luz, RN/CN

## 2016-12-12 NOTE — Telephone Encounter (Signed)
Nurse with Congregational Nurses:  Pt is out of metformin, lisionpril and gabpentin.  [facility] on [address].  He can be contacted at this home and his daughter speaks [place].

## 2016-12-14 IMAGING — US US EXTREM UP*L* COMP
1 series · 13 of 25 positions shown · non-contrast
Comparison: None.

CLINICAL DATA: 59-year-old male with left upper extremity swelling,
mass. The patient does not speak English and no other clinical
information could be obtained from him. However, the electronic
medical record states chronic left upper extremity soft tissue mass
(since [REDACTED]) but only recently has become painful. Initial
encounter.

EXAM:
ULTRASOUND LEFT UPPER EXTREMITY
TECHNIQUE: Ultrasound examination was performed of the soft tissues in the area
of clinical concern.

[Series 1: us extrem up*left* comp · 0.09mm/px · 28 acquisitions, 13 frames shown]
[im 1/28]
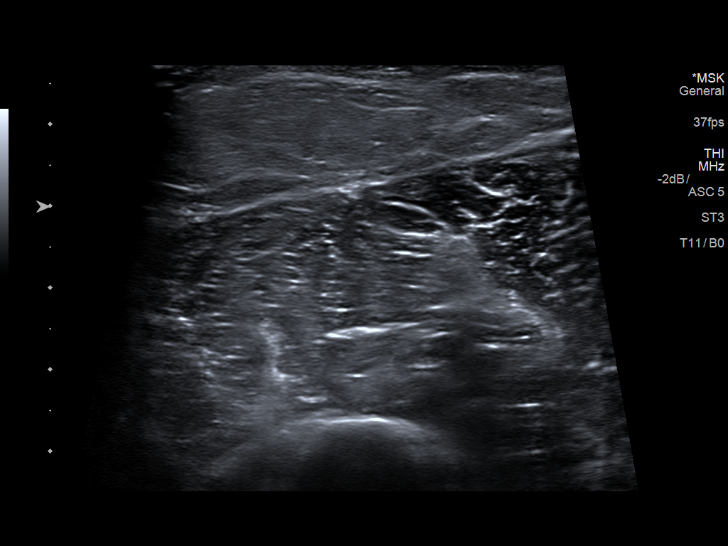
[im 3/28]
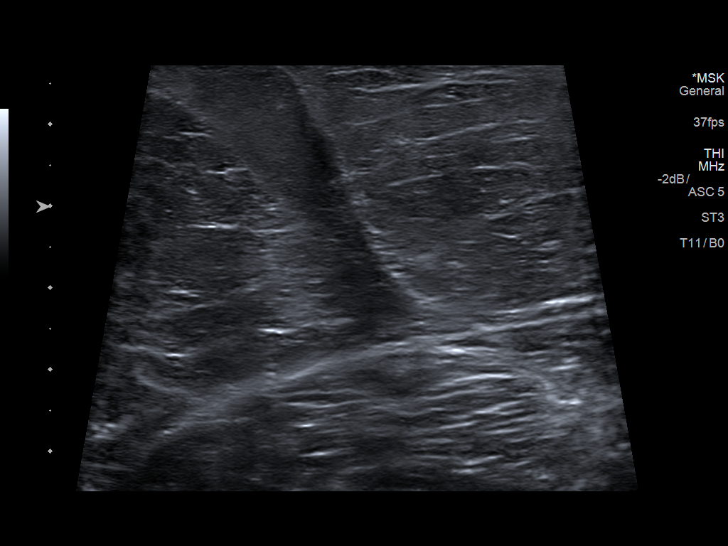
[im 5/28]
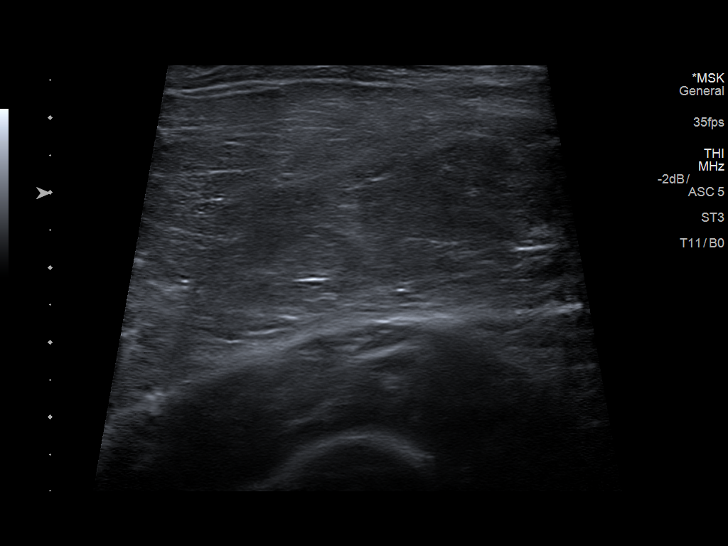
[im 7/28]
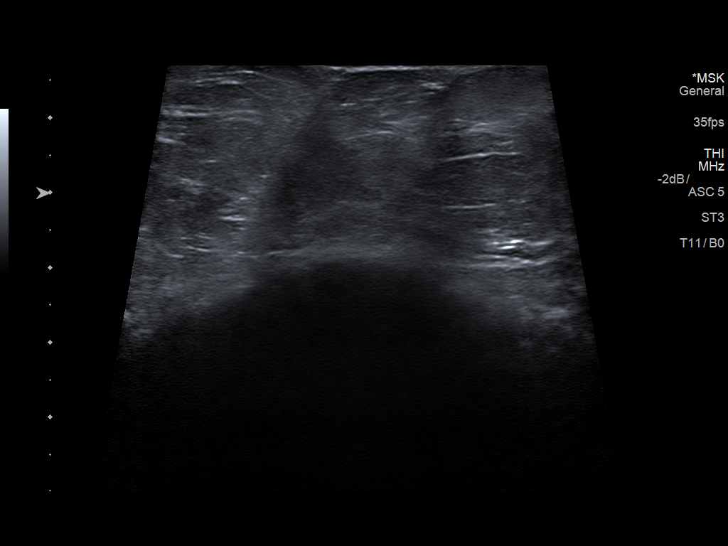
[im 10/28]
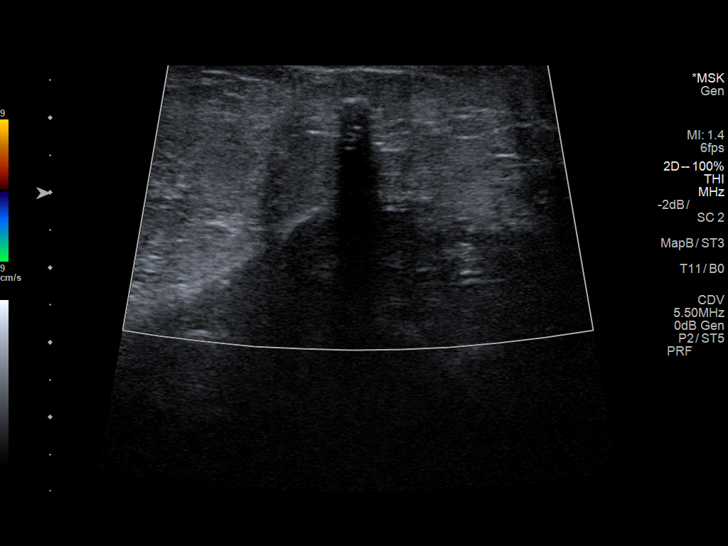
[im 12/28]
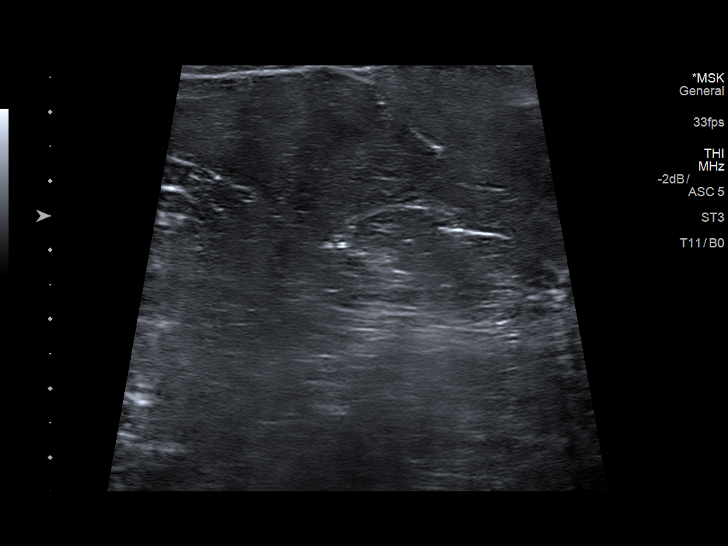
[im 14/28]
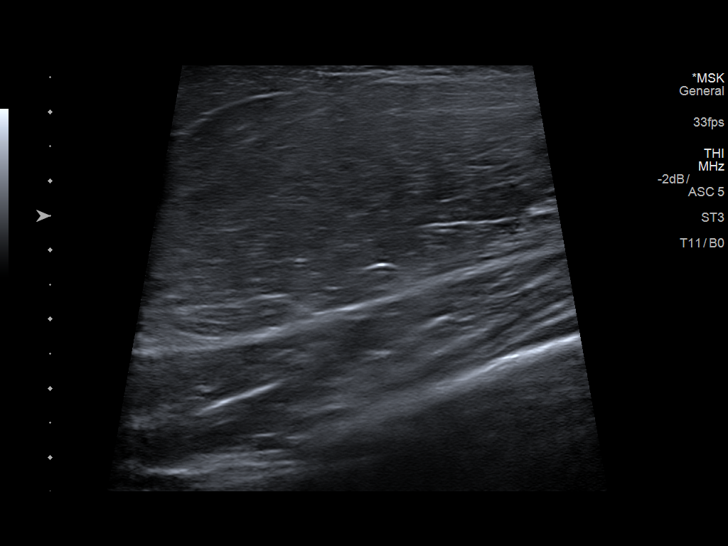
[im 16/28]
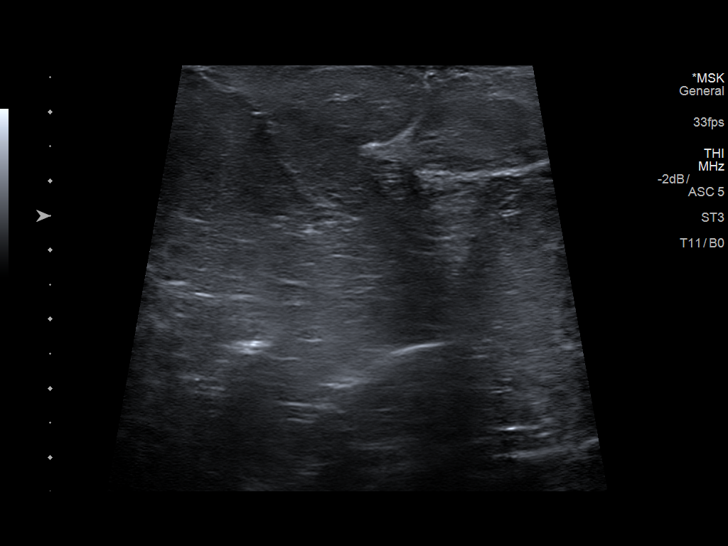
[im 19/28]
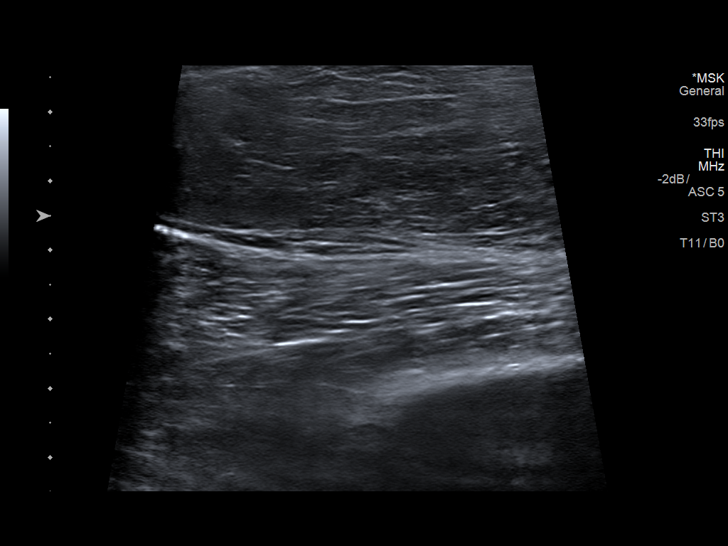
[im 21/28]
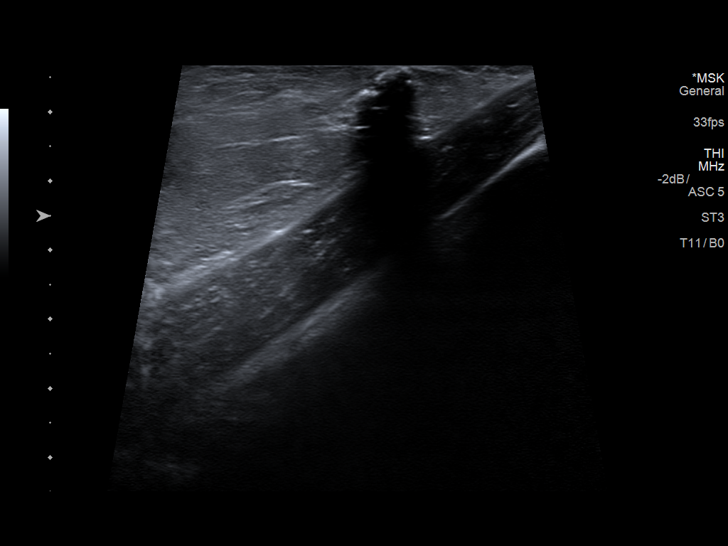
[im 23/28]
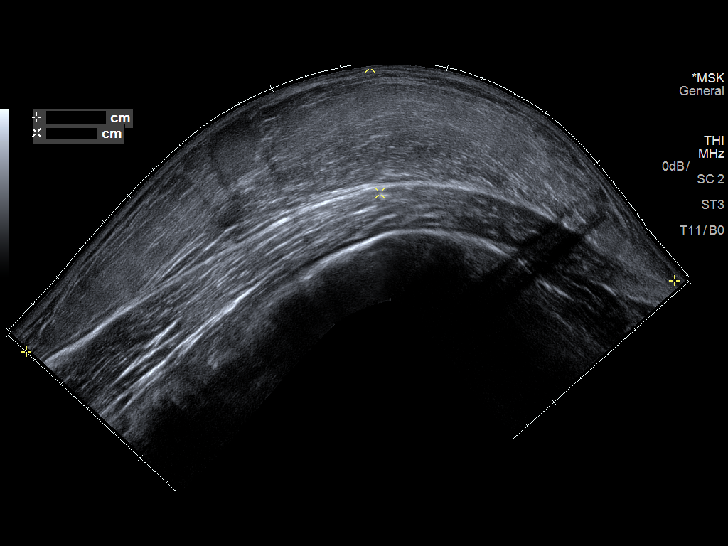
[im 25/28]
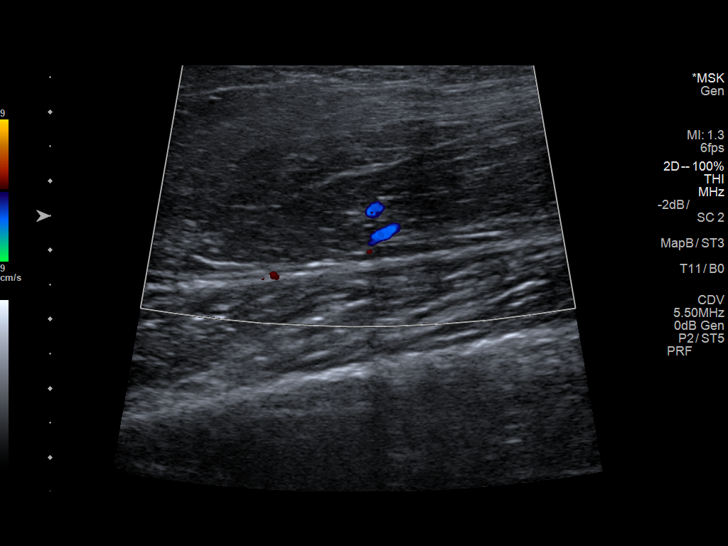
[im 28/28]
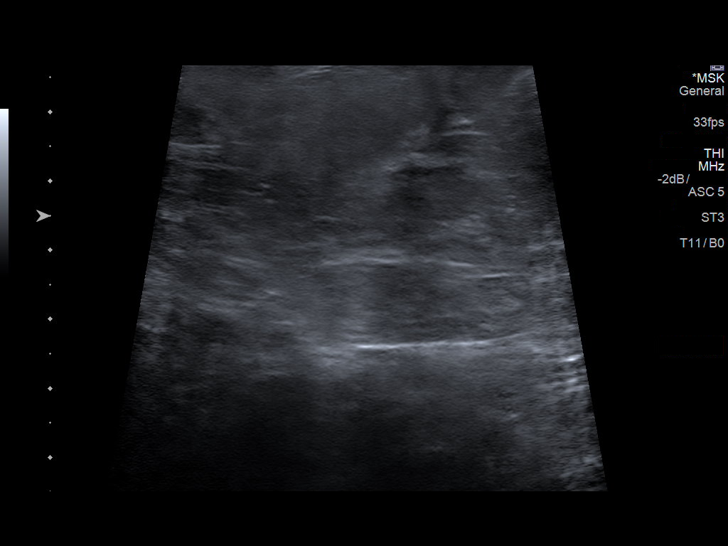

[13 of 25 positions shown; findings below may reference images not displayed]

FINDINGS: Situated between the elbow and the shoulder in the lateral aspect of
the upper arm there is a large, elongated soft tissue mass which the
technologist reports was mobile, encompassing 18 x 3.5 x 10.1 cm
(image 28). This has fibrous architecture. No associated fluid. No
definite hypervascularity, but there is internal flow (image 30).
This appears confined to the subcutaneous layer, the underlying
muscle architecture appears within normal limits.
IMPRESSION: Large 18 x 3.5 x 10.1 cm fibrous, complex appearing soft tissue mass
of the left upper arm. This appears confined to the subcutaneous
space and given chronicity, favor lipoma. However, recommend MRI of
the left upper arm without and with contrast to exclude
transformation to liposarcoma in this setting.

## 2017-01-01 ENCOUNTER — Encounter: Payer: Self-pay | Admitting: Family Medicine

## 2017-01-01 ENCOUNTER — Ambulatory Visit (INDEPENDENT_AMBULATORY_CARE_PROVIDER_SITE_OTHER): Payer: Medicaid Other | Admitting: Family Medicine

## 2017-01-01 VITALS — BP 128/84 | HR 76 | Temp 97.9°F | Ht 65.0 in | Wt 176.2 lb

## 2017-01-01 DIAGNOSIS — K5900 Constipation, unspecified: Secondary | ICD-10-CM | POA: Diagnosis present

## 2017-01-01 MED ORDER — POLYETHYLENE GLYCOL 3350 17 GM/SCOOP PO POWD: 17.0000 g | Freq: Three times a day (TID) | ORAL | 6 refills | Status: DC

## 2017-01-01 MED ORDER — SENNOSIDES-DOCUSATE SODIUM 8.6-50 MG PO TABS: 1.0000 | ORAL_TABLET | Freq: Two times a day (BID) | ORAL | 2 refills | Status: DC

## 2017-01-01 NOTE — Progress Notes (Signed)
    Subjective:  Randy Fisher is a 61 y.o. male who presents to the [facility] today with a chief complaint of abdominal pain.  HPI:  Swahili interpreter # [phone]  61yo man from [place] presenting today for abdominal pain.  Had his last BM yesterday morning, but does not feel like it was a "good one".  Typically goes once every 2-3 days. Ran out of his miralax several days ago and never picked up the senna that was prescribed to him previously. Denies any nausea, vomiting, fevers, chills or weight loss.   PMH: history of alcohol abuse, HTN, CHF, T2DM Tobacco use: none Medication: reviewed and updated ROS: see HPI   Objective:  Physical Exam: BP 128/84   Pulse 76   Temp 97.9 F (36.6 C) (Oral)   Ht  (1.651 m)   Wt 176 lb 3.2 oz (79.9 kg)   SpO2 98%   BMI 29.32 kg/m   Gen: 61yo M in NAD, resting comfortably CV: RRR with no murmurs appreciated Pulm: NWOB, CTAB with no crackles, wheezes, or rhonchi GI: +BS, soft, mild TTP diffusely without peritoneal signs or rebounding MSK: no edema, cyanosis, or clubbing noted Skin: warm, dry Neuro: grossly normal, moves all extremities Psych: Normal affect and thought content  No results found for this or any previous visit (from the past 72 hour(s)).   Assessment/Plan:  Constipation Given history of constipation and minimal diffuse abdominal tenderness, patient likely has stool burden. Refilled miralax and senna. Recommended increasing fiber content of diet and drink plenty of water. Follow up with PCP.   Daniel L. Myrtie Soman, MD [facility] Resident PGY-2 01/03/2017 8:29 PM

## 2017-01-01 NOTE — Patient Instructions (Signed)
Randy Fisher, you were seen today for constipation.  I have refilled your senna (constipation medicine). Take 2 pills each day.  You can also use the polyethylene glycol powder 3 times a day.   Please follow up with your primary doctor, Dr. Alanda Slim in two weeks to follow up.   [name] L. Myrtie Soman, MD [facility] Family Medicine Resident PGY-2 01/01/2017 11:50 AM

## 2017-01-03 NOTE — Assessment & Plan Note (Signed)
Given history of constipation and minimal diffuse abdominal tenderness, patient likely has stool burden. Refilled miralax and senna. Recommended increasing fiber content of diet and drink plenty of water. Follow up with PCP.

## 2017-01-14 ENCOUNTER — Telehealth: Payer: Self-pay | Admitting: Student

## 2017-01-14 NOTE — Telephone Encounter (Signed)
Pt came to office and drop a Medical Certification for Application & Renewal of Disability Parking Placard. [phone].

## 2017-01-14 NOTE — Telephone Encounter (Signed)
Clinical info completed on Handicap Placard form.  Place form in Dr. Sundra AlandGonfa's box for completion.  Sunday SpillersSharon T Saunders, CMA

## 2017-01-18 ENCOUNTER — Encounter: Payer: Self-pay | Admitting: Student

## 2017-01-18 ENCOUNTER — Ambulatory Visit (INDEPENDENT_AMBULATORY_CARE_PROVIDER_SITE_OTHER): Payer: Medicaid Other | Admitting: Student

## 2017-01-18 VITALS — BP 100/65 | HR 82 | Temp 98.1°F | Ht 65.0 in | Wt 181.0 lb

## 2017-01-18 DIAGNOSIS — K5900 Constipation, unspecified: Secondary | ICD-10-CM

## 2017-01-18 DIAGNOSIS — Z23 Encounter for immunization: Secondary | ICD-10-CM

## 2017-01-18 DIAGNOSIS — E119 Type 2 diabetes mellitus without complications: Secondary | ICD-10-CM

## 2017-01-18 LAB — POCT GLYCOSYLATED HEMOGLOBIN (HGB A1C): Hemoglobin A1C: 7.9

## 2017-01-18 MED ORDER — INSULIN GLARGINE 100 UNIT/ML ~~LOC~~ SOLN: 15.0000 [IU] | Freq: Every day | SUBCUTANEOUS | 2 refills | Status: DC

## 2017-01-18 NOTE — Patient Instructions (Signed)
It was great seeing you today! We have addressed the following issues today Diabetes: your A1c is 7.9% today. It was 7.3 %. Your goal A1c is less than 7.0%. See below for more information about A1c.  1. Check your blood glucose in the morning before breakfast 2. Continue injecting Lantus insulin 20 units every day 3. Continue taking your metformin twice a day 4. Please come back and see [place] on 01/25/2017.  Please bring all your medication bottles to that visit.  5. Good work on checking your blood sugar and writing the numbers.  Continue doing this and bring it to your next visit  What is A1c:  The A1C test result reflects your average blood sugar level for the past two to three months. Specifically, the A1C test measures what percentage of your hemoglobin - a protein in red blood cells that carries oxygen - is coated with sugar (glycated). The higher your A1C level, the poorer your blood sugar control and the higher your risk of diabetes complications. Portion Size    Choose healthier foods such as 100% whole grains, vegetables, fruits, beans, nut seeds, olive oil, most vegetable oils, fat-free dietary, wild game and fish.   Avoid sweet tea, other sweetened beverages, soda, fruit juice, cold cereal and milk and trans fat.   Eat at least 3 meals and 1-2 snacks per day.  Aim for no more than 5 hours between eating.  Eat breakfast within one hour of getting up.    Exercise at least 150 minutes per week, including weight resistance exercises 3 or 4 times per week.   Try to lose at least 7-10% of your current body weight.   Hypoglycemia Hypoglycemia is when the sugar (glucose) level in the blood is too low. Symptoms of low blood sugar may include:  Feeling: ? Hungry. ? Worried or nervous (anxious). ? Sweaty and clammy. ? Confused. ? Dizzy. ? Sleepy. ? Sick to your stomach (nauseous).  Having: ? A fast heartbeat. ? A headache. ? A change in your vision. ? Jerky movements that  you cannot control (seizure). ? Nightmares. ? Tingling or no feeling (numbness) around the mouth, lips, or tongue.  Having trouble with: ? Talking. ? Paying attention (concentrating). ? Moving (coordination). ? Sleeping.  Shaking.  Passing out (fainting).  Getting upset easily (irritability).  Low blood sugar can happen to people who have diabetes and people who do not have diabetes. Low blood sugar can happen quickly, and it can be an emergency. Treating Low Blood Sugar Low blood sugar is often treated by eating or drinking something sugary right away. If you can think clearly and swallow safely, follow the 15:15 rule:  Take 15 grams of a fast-acting carb (carbohydrate). Some fast-acting carbs are: ? 1 tube of glucose gel. ? 3 sugar tablets (glucose pills). ? 6-8 pieces of hard candy. ? 4 oz (120 mL) of fruit juice. ? 4 oz (120 mL) of regular (not diet) soda.  Check your blood sugar 15 minutes after you take the carb.  If your blood sugar is still at or below 70 mg/dL (3.9 mmol/L), take 15 grams of a carb again.  If your blood sugar does not go above 70 mg/dL (3.9 mmol/L) after 3 tries, get help right away.  After your blood sugar goes back to normal, eat a meal or a snack within 1 hour.  Treating Very Low Blood Sugar If your blood sugar is at or below 54 mg/dL (3 mmol/L), you have very low  blood sugar (severe hypoglycemia). This is an emergency. Do not wait to see if the symptoms will go away. Get medical help right away. Call your local emergency services (911 in the [place] Do not drive yourself to the hospital. If you have very low blood sugar and you cannot eat or drink, you may need a glucagon shot (injection). A family member or friend should learn how to check your blood sugar and how to give you a glucagon shot. Ask your doctor if you need to have a glucagon shot kit at home. Follow these instructions at home: General instructions  Avoid any diets that cause you to  not eat enough food. Talk with your doctor before you start any new diet.  Take over-the-counter and prescription medicines only as told by your doctor.  Limit alcohol to no more than 1 drink per day for nonpregnant women and 2 drinks per day for men. One drink equals 12 oz of beer, 5 oz of wine, or 1 oz of hard liquor.  Keep all follow-up visits as told by your doctor. This is important. If You Have Diabetes:   Make sure you know the symptoms of low blood sugar.  Always keep a source of sugar with you, such as: ? Sugar. ? Sugar tablets. ? Glucose gel. ? Fruit juice. ? Regular soda (not diet soda). ? Milk. ? Hard candy. ? Honey.  Take your medicines as told.  Follow your exercise and meal plan. ? Eat on time. Do not skip meals. ? Follow your sick day plan when you cannot eat or drink normally. Make this plan ahead of time with your doctor.  Check your blood sugar as often as told by your doctor. Always check before and after exercise.  Share your diabetes care plan with: ? Your work or school. ? People you live with.  Check your pee (urine) for ketones: ? When you are sick. ? As told by your doctor.  Carry a card or wear jewelry that says you have diabetes. If You Have Low Blood Sugar From Other Causes:   Check your blood sugar as often as told by your doctor.  Follow instructions from your doctor about what you cannot eat or drink. Contact a doctor if:  You have trouble keeping your blood sugar in your target range.  You have low blood sugar often. Get help right away if:  You still have symptoms after you eat or drink something sugary.  Your blood sugar is at or below 54 mg/dL (3 mmol/L).  You have jerky movements that you cannot control.  You pass out. These symptoms may be an emergency. Do not wait to see if the symptoms will go away. Get medical help right away. Call your local emergency services (911 in the [place] Do not drive yourself to the  hospital. This information is not intended to replace advice given to you by your health care provider. Make sure you discuss any questions you have with your health care provider. Document Released: 05/30/2009 Document Revised: 08/11/2015 Document Reviewed: 04/08/2015 Elsevier Interactive Patient Education  [employer].

## 2017-01-18 NOTE — Progress Notes (Signed)
Subjective:    Randy Fisher is a 61 y.o. old male here for follow-up on his diabetes. [employer] interpreter was ID [id number] was used for this encounter.  HPI Diabetes: A1c 7.9%.  Prior A1c 7.2%. However, he reports taking his Lantus every morning.  He could not tell me how much Lantus he is injecting.  He does not know what other medications he takes.  He reports running out of some of his medication for the last 2 months this.  He says the pharmacy delivers his medication to his home.  Unfortunately, he did not bring his medication bottles to this visit.  Call his pharmacy to verify about his medication.  Per pharmacist, he had he is metformin, lisinopril, Coreg and gabapentin filled less than a month ago.  He had his Lantus filled 2 and half months ago.  Per his pharmacy, he is using Lantus 15 units once daily. One bottle lasts him only 2 months if he uses his insulin everyday. He checks his fasting blood glucose every morning before breakfast.  He brought his FBG log to this visit. FBGs ranges from 105-450 over the last 1 month.  He had a fasting blood glucose to 48 about 2 days ago.  He reports feeling unwell at that time and drunk some orange juice and felt better.  He says his blood glucose was 85 when he rechecked it.  PMH/Problem List: has T2DM (type 2 diabetes mellitus) (HCC); History of alcohol abuse; Knee pain, bilateral; Language barrier; CHF (congestive heart failure) (HCC); Constipation; HTN (hypertension); Cataract; Achilles tendinosis; Elbow pain; Left upper extremity numbness; and Left hand pain on his problem list.   has a past medical history of Alcohol abuse; Diabetes mellitus without complication (HCC); and Hypertension.  FH:  Family History  Problem Relation Age of Onset  . Family history unknown: Yes    SH Social History  Substance Use Topics  . Smoking status: Never Smoker  . Smokeless tobacco: Never Used  . Alcohol use No    Review of Systems Review of systems negative  except for pertinent positives and negatives in history of present illness above.     Objective:     Vitals:   01/18/17 1040 01/18/17 1100  BP: (!) 88/40 100/65  Pulse: 82   Temp: 98.1 F (36.7 C)   TempSrc: Oral   SpO2: 98%   Weight: 181 lb (82.1 kg)   Height: 5\' 5"  (1.651 m)    Body mass index is 30.12 kg/m.  Physical Exam GEN: appears well, no apparent distress. Head: normocephalic and atraumatic  Eyes: conjunctiva without injection, sclera anicteric CVS: RRR, nl S1&S2, no murmurs, no edema RESP: no IWOB MSK: no focal tenderness or notable swelling SKIN: no apparent skin lesion NEURO: alert and oiented appropriately, no gross deficits  PSYCH: euthymic mood with congruent affect    Assessment and Plan:  1. Type 2 diabetes mellitus without complication, without long-term current use of insulin (HCC): A1c went up from 7.2-7.9%.  He reports using his Lantus but does not know how many units.  He says he has been out of some of his medication for the last 2 months but does not know which.  On the other side, his pharmacist states that he had his metformin filled less than a month ago.  However, he has not had his Lantus filled in more than 2.5 months. -Renewed his Lantus prescription today -Advised him to continue his metformin and other medications -I asked patient if he cannot return  on November 19 in the afternoon for my top C clinic.  Unfortunately, he declined this saying he has to be home when his children are back from school. So, I would would ask the clinic staff if they can blood my next slot after his at his next visit. - Pneumococcal polysaccharide vaccine 23-valent greater than or equal to 2yo subcutaneous/IM -Follow-up in 2 weeks.  Advised him to bring all his medications to that visit including his insulin  2. Need for pneumococcal vaccination - Pneumococcal polysaccharide vaccine 23-valent greater than or equal to 2yo subcutaneous/IM  3.  Constipation: Resolved  with MiraLAX.  Last bowel movement yesterday.  Return in about 2 weeks (around 02/04/2017) for Top C clinic at 1:30 PM on 02/04/2017.  Almon Herculesaye T Analysa Nutting, MD 01/18/17 Pager: [phone]

## 2017-01-21 NOTE — Telephone Encounter (Signed)
Not sure about the indication for this but will review with patient at next visit.

## 2017-01-22 ENCOUNTER — Telehealth: Payer: Self-pay | Admitting: *Deleted

## 2017-01-22 DIAGNOSIS — E119 Type 2 diabetes mellitus without complications: Secondary | ICD-10-CM

## 2017-01-22 MED ORDER — BASAGLAR KWIKPEN 100 UNIT/ML ~~LOC~~ SOPN: 15.0000 [IU] | PEN_INJECTOR | Freq: Every day | SUBCUTANEOUS | 3 refills | Status: DC

## 2017-01-22 MED ORDER — "PEN NEEDLES 5/16"" 30G X 8 MM MISC": 3 refills | Status: DC

## 2017-01-22 NOTE — Telephone Encounter (Signed)
Lantus requiring prior authorization. Formulary and prior auth form placed in PCP box

## 2017-01-22 NOTE — Telephone Encounter (Signed)
Changed to [employer]. Sent Rx for Pens as well. Thanks!

## 2017-01-23 ENCOUNTER — Other Ambulatory Visit: Payer: Self-pay | Admitting: Student

## 2017-01-23 DIAGNOSIS — E119 Type 2 diabetes mellitus without complications: Secondary | ICD-10-CM

## 2017-01-23 MED ORDER — INSULIN GLARGINE 100 UNIT/ML ~~LOC~~ SOLN: 15.0000 [IU] | Freq: Every day | SUBCUTANEOUS | 5 refills | Status: DC

## 2017-01-23 NOTE — Progress Notes (Signed)
Call pharmacy to clarify about the prior authorization on the patient's insulin (Lantus).  Lantus is on Medicaid formulary.  Unfortunately, insurance only pays for 30-day supply.  He is only injecting 15 units a day.  So a single vial would be 67-days supply.  I asked the pharmacy to change the day supply to 30 and resubmit the claim.  The pharmacist was able to confirm to me that the claim has gone through, and they will be able to fill patient's Lantus.

## 2017-02-04 ENCOUNTER — Other Ambulatory Visit: Payer: Self-pay | Admitting: Student

## 2017-03-04 ENCOUNTER — Encounter: Payer: Self-pay | Admitting: Student

## 2017-03-04 ENCOUNTER — Other Ambulatory Visit: Payer: Self-pay

## 2017-03-04 ENCOUNTER — Ambulatory Visit (INDEPENDENT_AMBULATORY_CARE_PROVIDER_SITE_OTHER): Payer: Medicaid Other | Admitting: Student

## 2017-03-04 VITALS — BP 118/84 | HR 74 | Temp 97.4°F | Ht 65.0 in | Wt 182.4 lb

## 2017-03-04 DIAGNOSIS — E119 Type 2 diabetes mellitus without complications: Secondary | ICD-10-CM

## 2017-03-04 MED ORDER — GLIPIZIDE 10 MG PO TABS: 10.0000 mg | ORAL_TABLET | Freq: Two times a day (BID) | ORAL | 3 refills | Status: DC

## 2017-03-04 NOTE — Progress Notes (Signed)
  Subjective:    Genella Rifeorozi is a 61 y.o. old male here for follow up on his diabetes [employer] interpreter was ID #[id number] was used for this encounter.  Patient is a very poor historian. HPI Diabetes: FBG in the range of 56 to 221. Usually in mid 100's.  He does not know how much insulin he injects. He is saying his belly is hurting from injecting insulin. He tried on his legs but it was more painful. He brought his medication to this visit except his insulins. He has 2 bottles of metformin.  He reports taking 1 tablet twice a day out of 1 bottle, and one as needed for pain out of other bottle. Walk twice weekly for about a mile. Rides stationary bike for 20 minutes on the weekdays and for about an hour on the weekends.  Denies drinking soda or juice.  Denies drinking alcohol at all.  Has not been to an eye doctor recently.  PMH/Problem List: has T2DM (type 2 diabetes mellitus) (HCC); History of alcohol abuse; Knee pain, bilateral; Language barrier; CHF (congestive heart failure) (HCC); Constipation; HTN (hypertension); Cataract; Achilles tendinosis; Elbow pain; Left upper extremity numbness; and Left hand pain on their problem list.   has a past medical history of Alcohol abuse, Diabetes mellitus without complication (HCC), and Hypertension.  FH:  Family History  Family history unknown: Yes    SH Social History   Tobacco Use  . Smoking status: Never Smoker  . Smokeless tobacco: Never Used  Substance Use Topics  . Alcohol use: No    Alcohol/week: 0.0 oz  . Drug use: No    Review of Systems Review of systems negative except for pertinent positives and negatives in history of present illness above.     Objective:     Vitals:   03/04/17 0953  BP: 118/84  Pulse: 74  Temp: (!) 97.4 F (36.3 C)  TempSrc: Oral  SpO2: 97%  Weight: 182 lb 6.4 oz (82.7 kg)  Height: 5\' 5"  (1.651 m)   Body mass index is 30.35 kg/m.  Physical Exam  GEN: appears well, no apparent distress. Head:  normocephalic and atraumatic  Oropharynx: mmm without erythema or exudation HEM: negative for cervical or periauricular lymphadenopathies CVS: RRR, nl s1 & s2, no murmurs, no edema RESP: no IWOB GI: BS present & normal, soft, NTND SKIN: no apparent skin lesion NEURO: alert and oiented appropriately, no gross deficits  PSYCH: euthymic mood with congruent affect    Assessment and Plan:  1. Type 2 diabetes mellitus without complication, without long-term current use of insulin (HCC): Fairly controlled.  Last A1c 7.9%.  The big issue seems to be understanding his medication.  He cannot read.  However, he has a 63 year old son at home who can read.  He is also complaining about pain when he injected his insulin.  At this point, I think it is reasonable to stop his Lantus and switch him to glipizide 10 mg twice daily and continuing metformin 1000 mg twice daily. Given his lower literacy, he would benefit from home health nurse to assist with his medication. He is agreeable to that.  [facility] - Face-to-face encounter (required for Medicare/Medicaid patients) -Ambulatory referral to ophthalmology for screening for diabetic retinopathy  Return in about 2 months (around 05/05/2017) for Diabetes.  Almon Herculesaye T Kierstyn Baranowski, MD 03/04/17 Pager: [phone]  I have spent over 30 minutes counseling and discussing about his diabetic care using pacific interpreter

## 2017-03-04 NOTE — Patient Instructions (Addendum)
It was great seeing you today! We have addressed the following issues today Diabetes:  1. We stopped your Lantus (insulin).  You do not need to check you blood sugar daily.  2. We started you on a medication called glipizide 10 mg twice a day 3. Continue taking your metformin 1000 mg twice a day 4. Eat regular meals (breakfast, lunch and dinner) around-the-clock. You may snack as needed. See below about few tips and recommendations on diet and exercise.  5. Watch for symptoms of low blood sugar (hypoglycemia). Read below about these symptoms and management. 6. Please come back and see [place] in 2 months.  7. Please bring all your medication bottles to that visit.   Portion Size    Choose healthier foods such as 100% whole grains, vegetables, fruits, beans, nut seeds, olive oil, most vegetable oils, fat-free dietary, wild game and fish.   Avoid sweet tea, other sweetened beverages, soda, fruit juice, cold cereal and milk and trans fat.   Eat at least 3 meals and 1-2 snacks per day.  Aim for no more than 5 hours between eating.  Eat breakfast within one hour of getting up.    Exercise at least 150 minutes per week, including weight resistance exercises 3 or 4 times per week.   Try to lose at least 7-10% of your current body weight.   Hypoglycemia Hypoglycemia is when the sugar (glucose) level in the blood is too low. Symptoms of low blood sugar may include:  Feeling: ? Hungry. ? Worried or nervous (anxious). ? Sweaty and clammy. ? Confused. ? Dizzy. ? Sleepy. ? Sick to your stomach (nauseous).  Having: ? A fast heartbeat. ? A headache. ? A change in your vision. ? Jerky movements that you cannot control (seizure). ? Nightmares. ? Tingling or no feeling (numbness) around the mouth, lips, or tongue.  Having trouble with: ? Talking. ? Paying attention (concentrating). ? Moving (coordination). ? Sleeping.  Shaking.  Passing out (fainting).  Getting upset easily  (irritability).  Low blood sugar can happen to people who have diabetes and people who do not have diabetes. Low blood sugar can happen quickly, and it can be an emergency. Treating Low Blood Sugar Low blood sugar is often treated by eating or drinking something sugary right away. If you can think clearly and swallow safely, follow the 15:15 rule:  Take 15 grams of a fast-acting carb (carbohydrate). Some fast-acting carbs are: ? 1 tube of glucose gel. ? 3 sugar tablets (glucose pills). ? 6-8 pieces of hard candy. ? 4 oz (120 mL) of fruit juice. ? 4 oz (120 mL) of regular (not diet) soda.  Check your blood sugar 15 minutes after you take the carb.  If your blood sugar is still at or below 70 mg/dL (3.9 mmol/L), take 15 grams of a carb again.  If your blood sugar does not go above 70 mg/dL (3.9 mmol/L) after 3 tries, get help right away.  After your blood sugar goes back to normal, eat a meal or a snack within 1 hour.  Treating Very Low Blood Sugar If your blood sugar is at or below 54 mg/dL (3 mmol/L), you have very low blood sugar (severe hypoglycemia). This is an emergency. Do not wait to see if the symptoms will go away. Get medical help right away. Call your local emergency services (911 in the [place] Do not drive yourself to the hospital. If you have very low blood sugar and you cannot eat or drink,  you may need a glucagon shot (injection). A family member or friend should learn how to check your blood sugar and how to give you a glucagon shot. Ask your doctor if you need to have a glucagon shot kit at home. Follow these instructions at home: General instructions  Avoid any diets that cause you to not eat enough food. Talk with your doctor before you start any new diet.  Take over-the-counter and prescription medicines only as told by your doctor.  Limit alcohol to no more than 1 drink per day for nonpregnant women and 2 drinks per day for men. One drink equals 12 oz of beer, 5 oz  of wine, or 1 oz of hard liquor.  Keep all follow-up visits as told by your doctor. This is important. If You Have Diabetes:   Make sure you know the symptoms of low blood sugar.  Always keep a source of sugar with you, such as: ? Sugar. ? Sugar tablets. ? Glucose gel. ? Fruit juice. ? Regular soda (not diet soda). ? Milk. ? Hard candy. ? Honey.  Take your medicines as told.  Follow your exercise and meal plan. ? Eat on time. Do not skip meals. ? Follow your sick day plan when you cannot eat or drink normally. Make this plan ahead of time with your doctor.  Check your blood sugar as often as told by your doctor. Always check before and after exercise.  Share your diabetes care plan with: ? Your work or school. ? People you live with.  Check your pee (urine) for ketones: ? When you are sick. ? As told by your doctor.  Carry a card or wear jewelry that says you have diabetes. If You Have Low Blood Sugar From Other Causes:   Check your blood sugar as often as told by your doctor.  Follow instructions from your doctor about what you cannot eat or drink. Contact a doctor if:  You have trouble keeping your blood sugar in your target range.  You have low blood sugar often. Get help right away if:  You still have symptoms after you eat or drink something sugary.  Your blood sugar is at or below 54 mg/dL (3 mmol/L).  You have jerky movements that you cannot control.  You pass out. These symptoms may be an emergency. Do not wait to see if the symptoms will go away. Get medical help right away. Call your local emergency services (911 in the [place] Do not drive yourself to the hospital. This information is not intended to replace advice given to you by your health care provider. Make sure you discuss any questions you have with your health care provider. Document Released: 05/30/2009 Document Revised: 08/11/2015 Document Reviewed: 04/08/2015 Elsevier Interactive Patient  Education  [employer].

## 2017-03-06 NOTE — Congregational Nurse Program (Signed)
Congregational Nurse Program Note  Date of Encounter: 01/08/2017  Past Medical History: Past Medical History:  Diagnosis Date  . Alcohol abuse   . Diabetes mellitus without complication (HCC)   . Hypertension     Encounter Details: Brief office visit for this Swahili and [place] speaking man with concern mainly about inability to secure medication after physician visit for constipation. [facility]  Pharmacy contacted regarding desire to have prescription delivered to home. Medication scheduled for delivery today. Information provided to patient. Encouraged increasing raw veggies, fruits and water in diet. Follow-up as needed. Ferol LuzMarietta Hulan Szumski, RN/CN

## 2017-03-26 NOTE — Congregational Nurse Program (Signed)
Congregational Nurse Program Note  Date of Encounter: 03/26/2017  Past Medical History: Past Medical History:  Diagnosis Date  . Alcohol abuse   . Diabetes mellitus without complication (HCC)   . Hypertension     Encounter Details: CNP Questionnaire - 03/26/17 0930      Questionnaire   Patient Status  Refugee    Race  African    Location [facility] Served At  [facility]    Insurance  Medicaid    Uninsured  Not Applicable    Food  Yes, have food insecurities    Housing/Utilities  Yes, have permanent housing    Transportation  No transportation needs    Interpersonal Safety  Yes, feel physically and emotionally safe where you currently live    Medication  Yes, have medication insecurities;Provided medication assistance    Medical Provider  Yes    Referrals  Other    ED Visit Averted  Not Applicable    Life-Saving Intervention Made  Not Applicable      Visit to CN office at [facility] with many concerns today starting with organizing daily medications in pill dispensary. Requesting appointments for two children experiencing ongoing illness and school concerns. Currently involved with agency Family Coordinator at [facility] to assist client and two teens with family counseling. Unsure about time medication should be taken. Currently using a single dose pill dispenser. Dressed neat and appropriate for weather. Also had concerns about generic Polyethlene G taken for constipation.  [employer] ineffective compared to same product by [employer] Admits not knowing which products to purchase in grocery store for health and skips breakfast frequently including today. Glucose 51 at 10:15 am. Repeat CBG 82 after eating hard candy and cookies in office.  Alert and well. Organized and provided larger pill dispenser with review of all meds. Assist calling [facility], [address]. Return to nurse 03/27/16 to receive information on food  selection and review of medications. Schedule appointment to see [facility] Family counselor, Rayburn MaKristian Hultgren and student intern assigned to family regarding appointments for children and counseling appointments. Ferol LuzMarietta Kita Neace, RN/CN

## 2017-03-26 NOTE — Patient Instructions (Signed)
M

## 2017-04-10 NOTE — Congregational Nurse Program (Signed)
Congregational Nurse Program Note  Date of Encounter: 04/09/2017  Past Medical History: Past Medical History:  Diagnosis Date  . Alcohol abuse   . Diabetes mellitus without complication (HCC)   . Hypertension     Encounter Details: CNP Questionnaire - 04/10/17 1115      Questionnaire   Patient Status  Refugee    Race  African    Location [facility] Served At  [facility]    Insurance  Medicaid    Uninsured  Not Applicable    Food  Yes, have food insecurities    Housing/Utilities  Yes, have permanent housing    Transportation  No transportation needs    Interpersonal Safety  Yes, feel physically and emotionally safe where you currently live    Medication  Yes, have medication insecurities;Provided medication assistance    Medical Provider  Yes    Referrals  Other    ED Visit Averted  Not Applicable    Life-Saving Intervention Made  Not Applicable      Office visit for this [place] man speaking in Swahili asking  to review medications and food selection for diabetic condition. Staff person interpreted in [place]gl[place]ili. Apparently confused about multiple doses of oral medication daily. B/P 155/92 despite confirming Lisinopril taken today. Currently taking diabetic medications in am especially without food over 4-5 hour period. Response is "I don't have an appetite in morning". Admits to experiencing problems food shopping and selecting food for home. Head of household in apartment with two teens. Diet is very limited in variety of fruits and veggie; includes carrots and bananas occasionally. Last BS taken at home normal range per client. Not taken today. Requested recheck in office. CBG 30 at 11:15 am. Alert and responsive as usual without noticeable problems. Admits to feeling week when questioned. Ate cookies and bottle of water. CBG 53 after 15 minutes. Continued to advise on food selections using appropriate food chart for visual emphasiis. Provided book "Living with Diabetes-An Everyday Guide for  You and Your Family" to share with English speaking daughter to assist. Alert and capable of retaining information. Requesting treatment for poor appetite. Plan: Refer to PCP to address appetite problem; nutritional classes. Return to nurse office on 04/16/17 to review meds, diet and BS. Agreed on follow-up. Ferol LuzMarietta Eavan Gonterman, RN/CN.

## 2017-04-22 ENCOUNTER — Other Ambulatory Visit: Payer: Self-pay | Admitting: Student

## 2017-04-22 DIAGNOSIS — E119 Type 2 diabetes mellitus without complications: Secondary | ICD-10-CM

## 2017-05-16 ENCOUNTER — Other Ambulatory Visit: Payer: Self-pay | Admitting: Student

## 2017-05-16 DIAGNOSIS — E119 Type 2 diabetes mellitus without complications: Secondary | ICD-10-CM

## 2017-05-22 NOTE — Congregational Nurse Program (Signed)
Congregational Nurse Program Note  Date of Encounter: 05/22/2017  Past Medical History: Past Medical History:  Diagnosis Date  . Alcohol abuse   . Diabetes mellitus without complication (HCC)   . Hypertension     Encounter Details: CNP Questionnaire - 05/22/17 0930      Questionnaire   Patient Status  Refugee    Race  African    Location [facility] Served At  [facility]    Insurance  Medicaid    Uninsured  Not Applicable    Food  Yes, have food insecurities    Housing/Utilities  Yes, have permanent housing    Transportation  No transportation needs    Interpersonal Safety  Yes, feel physically and emotionally safe where you currently live    Medication  Yes, have medication insecurities;Provided medication assistance    Medical Provider  Yes    Referrals  Other    ED Visit Averted  Not Applicable    Life-Saving Intervention Made  Not Applicable      Visit to nurse office to request a glucose check. Continues to express concern about lack of appetite and inadequate food intake. Blood glucose 95 at 9:30 am today. Last glucose check was one week ago. Single parent attempting to care for self and two teen children. Reassured regarding care. Discussed food selections to include fresh veggies, grains and fruits. Return prn. Ferol LuzMarietta Rama Sorci, RN/CN

## 2017-05-22 NOTE — Patient Instructions (Signed)
[facility] and food assistance as needed. Monitor blood sugar

## 2017-06-12 ENCOUNTER — Encounter: Payer: Self-pay | Admitting: Student

## 2017-06-12 ENCOUNTER — Ambulatory Visit: Payer: Medicaid Other | Admitting: Student

## 2017-06-12 ENCOUNTER — Other Ambulatory Visit: Payer: Self-pay

## 2017-06-12 VITALS — BP 122/80 | HR 76 | Temp 97.9°F | Ht 66.0 in | Wt 186.8 lb

## 2017-06-12 DIAGNOSIS — M7061 Trochanteric bursitis, right hip: Secondary | ICD-10-CM | POA: Insufficient documentation

## 2017-06-12 DIAGNOSIS — E118 Type 2 diabetes mellitus with unspecified complications: Secondary | ICD-10-CM

## 2017-06-12 DIAGNOSIS — K5900 Constipation, unspecified: Secondary | ICD-10-CM | POA: Diagnosis not present

## 2017-06-12 LAB — POCT GLYCOSYLATED HEMOGLOBIN (HGB A1C): HEMOGLOBIN A1C: 7.5

## 2017-06-12 MED ORDER — POLYETHYLENE GLYCOL 3350 17 GM/SCOOP PO POWD: 17.0000 g | Freq: Every day | ORAL | 6 refills | Status: DC

## 2017-06-12 MED ORDER — GLIPIZIDE 5 MG PO TABS: 5.0000 mg | ORAL_TABLET | Freq: Two times a day (BID) | ORAL | 3 refills | Status: DC

## 2017-06-12 MED ORDER — METHYLPREDNISOLONE ACETATE 40 MG/ML IJ SUSP
40.0000 mg | Freq: Once | INTRAMUSCULAR | Status: AC
  Administered 2017-06-12: 40 mg via INTRA_ARTICULAR

## 2017-06-12 NOTE — Patient Instructions (Addendum)
It was great seeing you today! We have addressed the following issues today  Right hip pain: We gave you steroid injection today.  This should help with the pain.  Please let us know if you have worsening pain, swelling, fever or other symptoms concerning to you.  Diabetes: We have send a prescription for glipizide 5 mg to your pharmacy.  Please take this medication twice daily once you receive it.  Please come back and see us in 3 months.  Constipation: We have refilled your MiraLAX today.  If we did any lab work today, and the results require attention, either me or my nurse will get in touch with you. If everything is normal, you will get a letter in mail and a message via . If you don't hear from us in two weeks, please give us a call. Otherwise, we look forward to seeing you again at your next visit. If you have any questions or concerns before then, please call the clinic at [phone].  Please bring all your medications to every doctors visit  Sign up for My Chart to have easy access to your labs results, and communication with your Primary care physician.    Please check-out at the front desk before leaving the clinic.    Take Care,   Dr. Alanda SlimGonfa

## 2017-06-12 NOTE — Progress Notes (Signed)
Subjective:    Randy Fisher is a 62 y.o. old male here for right hip pain and question about his diabetic medication. [place] interpreter was ID number [id number] used for this encounter. HPI Right thigh pain: this has been going on for two months. Spontaneous onset.  Denies history of trauma, fall or injury.  Describes the pain as aching. He points at his right trochanteric. Pain radiation to his right knee. Pain with walking and sleeping on that side.  He admits some numbness in his feet from his diabetes.  Denies fever, chills, fatigue or unintentional weight loss  Diabetes: A1c 7.5% today.  Previously 7.9%.  Not taking glipizide.  He says he was not sure on how to take his glipizide.  He is off insulin.  Reports good compliance with metformin.  PMH/Problem List: has T2DM (type 2 diabetes mellitus) (HCC); History of alcohol abuse; Knee pain, bilateral; Language barrier; CHF (congestive heart failure) (HCC); Constipation; HTN (hypertension); Cataract; Achilles tendinosis; Elbow pain; Left upper extremity numbness; Left hand pain; and Greater trochanteric bursitis of right hip on their problem list.   has a past medical history of Alcohol abuse, Diabetes mellitus without complication (HCC), and Hypertension.  FH:  Family History  Family history unknown: Yes    SH Social History   Tobacco Use  . Smoking status: Never Smoker  . Smokeless tobacco: Never Used  Substance Use Topics  . Alcohol use: No    Alcohol/week: 0.0 oz  . Drug use: No    Review of Systems Review of systems negative except for pertinent positives and negatives in history of present illness above.     Objective:     Vitals:   06/12/17 1009  BP: 122/80  Pulse: 76  Temp: 97.9 F (36.6 C)  TempSrc: Oral  SpO2: 99%  Weight: 186 lb 12.8 oz (84.7 kg)  Height: 5\' 6"  (1.676 m)   Body mass index is 30.15 kg/m.  Physical Exam  GEN: appears well. No apparent distress. Head: normocephalic and atraumatic  CVS: RRR,  nl s1 & s2, no murmurs, no edema RESP: no IWOB, good air movement bilaterally, CTAB GI: BS present & normal, soft, NTND MSK:  Right lower extremity Appears symmetric with the left.  No bony deformities, inflammation or skin lesion.  Point tenderness to palpation over his right great trochanter.  No tenderness over lower lumbar spines, sacroiliac joint and paraspinal muscles.  Full range of motion with flexion & extension, internal & external rotation, abduction, & adduction but painful with abduction at hip and extension at the knee.  Neurovascular intact. SKIN: As above NEURO: As above PSYCH: euthymic mood with congruent affect  Procedure: Steroid injection  Indication: Right greater trochanteric bursitis Informed consent was obtained after describing risks and benefits of the procedure with the patient, this includes bleeding, bruising, infection and medication side effects. The patient wishes to proceed and has given written consent. Patient was placed in lying position with right hip up, hips and knees flexed about 30 degrees. The Right greater trochanteric area was marked and prepped with betadine at the point of maximal tenderness. Vapocoolant spray applied. A 21-gauge 1.5 inch needle was inserted into the greater trochanteric area until bone was felt.  Needle was drawn back about 3 mm. After negative draw back for blood, a 5cc solution containing 1mL of Depomedrol (40 mg)  and 4.0 mL of 1% lidocaine was injected. A band aid was applied. The patient tolerated the procedure well. Post procedure instructions were discussed and  given.  Assessment and Plan:  1. Greater trochanteric bursitis of right hip: history and exam consistent with right greater trochanteric bursitis.  He has pain with walking and lying on the right side.  He has point tenderness over right greater trochanter.  Steroid injection given as above.  Return precautions discussed.  2. Type 2 diabetes mellitus: A1c 7.5%.  Previously  7.9%.  He says he has not taken his glipizide because he did not know on how to take it.  Reports good compliance with his metformin.  Given improvement of glipizide, will continue metformin and reduce his glipizide to 5 mg twice daily.  Follow-up in 3 months.  3. Constipation, unspecified constipation type: Refilled his MiraLAX today. - polyethylene glycol powder (GLYCOLAX/MIRALAX) powder; Take 17 g by mouth daily.  Dispense: 850 g; Refill: 6  Return in about 3 months (around 09/12/2017) for Diabetes.  Almon Herculesaye T Stephenie Navejas, MD 06/12/17 Pager: [phone]

## 2017-06-18 NOTE — Congregational Nurse Program (Signed)
Congregational Nurse Program Note  Date of Encounter: 06/18/2017  Past Medical History: Past Medical History:  Diagnosis Date  . Alcohol abuse   . Diabetes mellitus without complication (HCC)   . Hypertension     Encounter Details: CNP Questionnaire - 06/18/17 1100      Questionnaire   Patient Status  Refugee    Race  African    Location [facility]erved At  [facility]    Insurance  Medicaid    Uninsured  Not Applicable    Food  Yes, have food insecurities    Housing/Utilities  Yes, have permanent housing    Transportation  No transportation needs    Interpersonal Safety  Yes, feel physically and emotionally safe where you currently live    Medication  Yes, have medication insecurities;Provided medication assistance    Medical Provider  Yes    Referrals  Other    ED Visit Averted  Not Applicable    Life-Saving Intervention Made  Not Applicable      Clinical Intake - 06/12/17 1007      Pre-visit preparation   Pre-visit preparation completed  Yes      Abuse/Neglect   Do you feel unsafe in your current relationship?  No    Do you feel physically threatened by others?  No      Patient Literacy   How often do you need to have someone help you when you read instructions, pamphlets, or other written materials from your doctor or pharmacy?  5 - Always    What is the last grade level you completed in school?  language barrier      Language Assistant   Interpreter Needed?  Yes    Interpreter Agency  [employer]atus    Interpreter Name  [place]    Interpreter ID  [id number]      Brief office visit to request assistance in calling pharmacy for Rx refill. Last PCP visit noted 06/12/17. [facility] and will deliver medications to address on 06-19-17. Attempted to instruct on calling pharmacy, but very resistant to suggestions due to language barrier. Ferol LuzMarietta Rosco Harriott, RN/CN

## 2017-06-26 NOTE — Congregational Nurse Program (Signed)
Congregational Nurse Program Note  Date of Encounter: 06/26/2017  Past Medical History: Past Medical History:  Diagnosis Date  . Alcohol abuse   . Diabetes mellitus without complication (HCC)   . Hypertension     Encounter Details: CNP Questionnaire - 06/26/17 1130      Questionnaire   Patient Status  Refugee    Race  African    [facility] At  [facility]    Insurance  Medicaid    Uninsured  Not Applicable    Food  No food insecurities    Housing/Utilities  Yes, have permanent housing    Transportation  Yes, need transportation assistance    Medication  Yes, have medication insecurities;Provided medication assistance    Medical Provider  Yes    Referrals  Other    ED Visit Averted  Not Applicable    Life-Saving Intervention Made  Not Applicable      Clinical Intake - 06/12/17 1007      Pre-visit preparation   Pre-visit preparation completed  Yes      Abuse/Neglect   Do you feel unsafe in your current relationship?  No    Do you feel physically threatened by others?  No      Patient Literacy   How often do you need to have someone help you when you read instructions, pamphlets, or other written materials from your doctor or pharmacy?  5 - Always    What is the last grade level you completed in school?  language barrier      Language Assistant   Interpreter Needed?  Yes    Interpreter Agency  [employer]    Interpreter Name  [place]    Interpreter ID  [id number]      Office visit at [facility] nurse office today complaining that Pharmacy failed to deliver his prescription since two weeks ago. No attempt made to follow-up. Phone call made at request of patient to pharmacy location previously used. Expressing frustration and very irritated. No diabetic medication taken since two weeks since med not delivered. Feeling ill today. Ate crackers prior to office visit and no other food today. Glucose N/F 269. Continues to experience loss of appetite, but states "I eat anyway". Feels  multiple vitamin may help appetite. Generally cooks own food and food for two teens in home. Discussed own role in care of health and responsibility to follow-up with pharmacy and prescriptions despite lack of delivery by pharmacy. Stressed danger of ignoring treatment over extended period. Recommended retuning to [facility] nurse office for assistance or instructing English speaking older children to call pharmacy re refills. [facility], [address] contacted and confirmed prescriptions already filled and waiting. Apparently original request for delivery missed. Today's deliveries  already gone out. Medications will be picked up today and taken to patient. Advised to take medications ASAP once received. Follow-up prn at [facility] nurse office. Ferol LuzMarietta Mikeila Burgen, RN/CN

## 2017-06-27 ENCOUNTER — Other Ambulatory Visit: Payer: Self-pay | Admitting: Student

## 2017-07-31 NOTE — Congregational Nurse Program (Signed)
Congregational Nurse Program Note  Date of Encounter: 07/31/2017  Past Medical History: Past Medical History:  Diagnosis Date  . Alcohol abuse   . Diabetes mellitus without complication (HCC)   . Hypertension     Encounter Details: CNP Questionnaire - 07/31/17 1338      Questionnaire   Medication  Provided medication assistance      Visited nurse office at [facility] to request assistance with medication refill. [facility], Dr[facility] Dru[facility] Opitz[facility]. Pharmacy to call PCP for refills and patient to follow-up. Due to limited time today, advised to return for nurse recheck of blood pressure and review self testing and record of glucose readings on 08-13-17. Ferol Luz, RN/CN

## 2017-08-13 NOTE — Congregational Nurse Program (Signed)
Congregational Nurse Program Note  Date of Encounter: 08/13/2017  Past Medical History: Past Medical History:  Diagnosis Date  . Alcohol abuse   . Diabetes mellitus without complication (HCC)   . Hypertension     Encounter Details: CNP Questionnaire - 08/13/17 1130      Questionnaire   Patient Status  Refugee    Race  African    Location [facility] At  [facility]    Insurance  Medicaid    Uninsured  Not Applicable    Food  No food insecurities    Housing/Utilities  Yes, have permanent housing    Transportation  Yes, need transportation assistance    Interpersonal Safety  Yes, feel physically and emotionally safe where you currently live    Medication  Provided medication assistance    Medical Provider  Yes    Referrals  Other    ED Visit Averted  Not Applicable    Life-Saving Intervention Made  Not Applicable      Brief office visit with questions regarding refilling a prescription for eye drops. Complaining of eye irritation and watery discharge from both eyes several days. States "I have no more drops". [facility] [address], [place]. Unable to name eye drops. Next PCP appointment 09/11/17 at [facility]. Blood pressure lower than usual;103/70. N/F blood sugar 159. Lack of adequate hydration.  Return 08/14/17 with name of eye drops and refer to PCP for follow-up and referral. Increase H2O intake especially during extreme heat. Rinse eyes with cool clear water; dry using tissue. Refrain from rubbing eyes. Ferol Luz, RN/CN

## 2017-08-14 NOTE — Congregational Nurse Program (Signed)
Congregational Nurse Program Note  Date of Encounter: 08/14/2017  Past Medical History: Past Medical History:  Diagnosis Date  . Alcohol abuse   . Diabetes mellitus without complication (HCC)   . Hypertension     Encounter Details: CNP Questionnaire - 08/14/17 1148      Questionnaire   Patient Status  Refugee    Race  African    [facility] At  [facility]    Insurance  Medicaid    Uninsured  Not Applicable    Food  No food insecurities    Housing/Utilities  Yes, have permanent housing    Transportation  Yes, need transportation assistance    Interpersonal Safety  Yes, feel physically and emotionally safe where you currently live    Medication  Provided medication assistance    Medical Provider  Yes    Referrals  Other    ED Visit Averted  Not Applicable    Life-Saving Intervention Made  Not Applicable       Follow-up office visit regarding referral for evaluation of eye discomfort. Previously evaluated by Dr. Ernesto Rutherford 10/17 and prescribed Durezo 0.05 % gtts for right eye per prescription. Drops expired. Advised to discard and appointment scheduled 08/16/17 at 12:45 PM. Information accepted and confirmed with patient. Agreed to provide own interpreter for appointment. Return to nurse office prn. Ferol Luz, RN/CN

## 2017-09-02 LAB — HM DIABETES EYE EXAM

## 2017-09-11 ENCOUNTER — Encounter: Payer: Self-pay | Admitting: Student

## 2017-09-11 ENCOUNTER — Other Ambulatory Visit: Payer: Self-pay

## 2017-09-11 ENCOUNTER — Ambulatory Visit: Payer: Medicaid Other | Admitting: Student

## 2017-09-11 VITALS — BP 98/65 | HR 68 | Temp 97.7°F | Wt 180.0 lb

## 2017-09-11 DIAGNOSIS — E118 Type 2 diabetes mellitus with unspecified complications: Secondary | ICD-10-CM | POA: Diagnosis present

## 2017-09-11 DIAGNOSIS — K5900 Constipation, unspecified: Secondary | ICD-10-CM | POA: Diagnosis not present

## 2017-09-11 DIAGNOSIS — E785 Hyperlipidemia, unspecified: Secondary | ICD-10-CM | POA: Diagnosis not present

## 2017-09-11 LAB — POCT GLYCOSYLATED HEMOGLOBIN (HGB A1C): HBA1C, POC (CONTROLLED DIABETIC RANGE): 8.8 % — AB (ref 0.0–7.0)

## 2017-09-11 MED ORDER — METFORMIN HCL 1000 MG PO TABS: 1000.0000 mg | ORAL_TABLET | Freq: Two times a day (BID) | ORAL | 5 refills | Status: DC

## 2017-09-11 MED ORDER — POLYETHYLENE GLYCOL 3350 17 GM/SCOOP PO POWD: 17.0000 g | Freq: Every day | ORAL | 6 refills | Status: DC

## 2017-09-11 MED ORDER — ATORVASTATIN CALCIUM 40 MG PO TABS: ORAL_TABLET | ORAL | 3 refills | Status: DC

## 2017-09-11 MED ORDER — GABAPENTIN 300 MG PO CAPS: 300.0000 mg | ORAL_CAPSULE | Freq: Three times a day (TID) | ORAL | 3 refills | Status: DC

## 2017-09-11 MED ORDER — GLIPIZIDE 10 MG PO TABS: 10.0000 mg | ORAL_TABLET | Freq: Two times a day (BID) | ORAL | 0 refills | Status: DC

## 2017-09-11 MED ORDER — LISINOPRIL 5 MG PO TABS
5.0000 mg | ORAL_TABLET | Freq: Every day | ORAL | 3 refills | Status: DC
Start: 2017-09-11 — End: 2018-04-28

## 2017-09-11 NOTE — Progress Notes (Signed)
  Subjective:    Randy Fisher is a 62 y.o. old male here for follow up on diabetes.  Reviewed her interpreter with ID number [id number] was used for this encounter.  HPI Diabetes: A1c 8.8% today. Previously 7.5% about three months ago. He states that the old pharmacy was shut down and he was out of his medications for about two weeks. He states that the new pharmacy does not deliver his medication.  He reports difficulty catching a bus to his pharmacy. Reports difficulty walking due to chronic back pain. Reports occasional juice about a cup a day. Denies soda. Cooks "ugali" at home and eat with vegetables. Denies drinking EtOH or smoking cigarettes.  PMH/Problem List: has T2DM (type 2 diabetes mellitus) (HCC); History of alcohol abuse; Knee pain, bilateral; Language barrier; CHF (congestive heart failure) (HCC); Constipation; HTN (hypertension); Cataract; Achilles tendinosis; Elbow pain; Left upper extremity numbness; Left hand pain; and Greater trochanteric bursitis of right hip on their problem list.   has a past medical history of Alcohol abuse, Diabetes mellitus without complication (HCC), and Hypertension.  FH:  Family History  Family history unknown: Yes    SH Social History   Tobacco Use  . Smoking status: Never Smoker  . Smokeless tobacco: Never Used  Substance Use Topics  . Alcohol use: No    Alcohol/week: 0.0 oz  . Drug use: No    Review of Systems Review of systems negative except for pertinent positives and negatives in history of present illness above.     Objective:     Vitals:   09/11/17 0956  BP: 98/65  Pulse: 68  Temp: 97.7 F (36.5 C)  TempSrc: Oral  Weight: 180 lb (81.6 kg)   Body mass index is 29.05 kg/m.  Physical Exam  GEN: appears well & comfortable. No apparent distress. HEM: negative for cervical or periauricular lymphadenopathies CVS: RRR, nl s1 & s2 RESP: no IWOB, good air movement bilaterally, CTAB GI: BS present & normal, soft, NTND MSK: no  focal tenderness or notable swelling SKIN: no apparent skin lesion NEURO: alert and oiented appropriately, no gross deficits   PSYCH: euthymic mood with congruent affect    Assessment and Plan:  1. Type 2 diabetes mellitus with complication, without long-term current use of insulin (HCC): Suboptimally controlled.  A1c 8.8% today.  Previously 7.5%. Has been out of his medication for a couple of weeks when his old pharmacy was shut down. [facility]. Gave him Medicaid information for bus pass.  We will increase his glipizide to 10 mg twice daily.  Discussed about lifestyle change including diet and exercise.  Gave him handout. Went over symptoms of hypoglycemia and given handout as well. - glipiZIDE (GLUCOTROL) 10 MG tablet; Take 1 tablet (10 mg total) by mouth 2 (two) times daily before a meal.  Dispense: 180 tablet; Refill: 0  2. Constipation, unspecified constipation type - polyethylene glycol powder (GLYCOLAX/MIRALAX) powder; Take 17 g by mouth daily.  Dispense: 525 g; Refill: 6  Return in about 3 months (around 12/12/2017) for Diabetes.  Almon Herculesaye T Jozette Castrellon, MD 09/11/17 Pager: [phone]

## 2017-09-11 NOTE — Patient Instructions (Signed)
It was great seeing you today! We have addressed the following issues today  Diabetes: Your A1c is 8.8% today.  To 7.5% previously.  Your goal A1c is less than 7.5%.  Continue taking your metformin.  We increased your glipizide to 10 mg twice a day.  See below for some recommendation on diet and exercise.  Constipation: We sent a prescription for MiraLAX to pharmacy. We also suggest you eat a lot of green vegetables and fruits.  Difficulty getting your medication: we will contact your pharmacy to see if they can deliver your medication at home.   If we did any lab work today, and the results require attention, either me or my nurse will get in touch with you. If everything is normal, you will get a letter in mail and a message via . If you don't hear from us in two weeks, please give us a call. Otherwise, we look forward to seeing you again at your next visit. If you have any questions or concerns before then, please call the clinic at [phone].  Please bring all your medications to every doctors visit  Sign up for My Chart to have easy access to your labs results, and communication with your Primary care physician.    Please check-out at the front desk before leaving the clinic.    Take Care,   Dr. Alanda SlimGonfa Portion Size   Choose healthier foods such as 100% whole grains, vegetables, fruits, beans, nut seeds, olive oil, most vegetable oils, fat-free dietary, wild game and fish.   Avoid sweet tea, other sweetened beverages, soda, fruit juice, cold cereal and milk and trans fat.   Eat at least 3 meals and 1-2 snacks per day.  Aim for no more than 5 hours between eating.  Eat breakfast within one hour of getting up.    Exercise at least 150 minutes per week, including weight resistance exercises 3 or 4 times per week.   Try to lose at least 7-10% of your current body weight.   Limit your salt (Sodium) intake to less than 2 gm (2000 mg) a day if you have conditions such as   elevated blood pressure, heart failure...    You may also read about DASH and/or Mediterranean diet at the following web site if you have blood pressure or heart condition. Paid[employer].com.cywww.mayoclinic.org/healthy-lifestyle/nutrition-and-healthy-eating/in-depth/dash-diet http://mckinney.org/www.mayoclinic.org/healthy-lifestyle/nutrition-and-healthy-eating/in-depth/mediterranean-diet/

## 2017-09-18 NOTE — Congregational Nurse Program (Signed)
Congregational Nurse Program Note  Date of Encounter: 09/18/2017  Past Medical History: Past Medical History:  Diagnosis Date  . Alcohol abuse   . Diabetes mellitus without complication (HCC)   . Hypertension     Encounter Details: CNP Questionnaire - 09/18/17 1230      Questionnaire   Patient Status  Refugee    Race  African    Location Patient Served At  [facility]    Insurance  Medicaid    Uninsured  Not Applicable    Food  No food insecurities    Housing/Utilities  Yes, have permanent housing    Transportation  Yes, need transportation assistance    Interpersonal Safety  Yes, feel physically and emotionally safe where you currently live    Medication  Provided medication assistance    Medical Provider  Yes    Referrals  Other    ED Visit Averted  Not Applicable    Life-Saving Intervention Made  Not Applicable      Clinical Intake - 09/11/17 0957      Pre-visit preparation   Pre-visit preparation completed  Yes      Abuse/Neglect   Do you feel unsafe in your current relationship?  No    Do you feel physically threatened by others?  No    Anyone hurting you at home, work, or school?  No    Unable to ask?  No      Patient Literacy   How often do you need to have someone help you when you read instructions, pamphlets, or other written materials from your doctor or pharmacy?  5 - Always    What is the last grade level you completed in school?  language barrier      Language Assistant   Interpreter Needed?  Yes    Interpreter Agency  [employer]    Interpreter Name  satie    Interpreter ID  [id number]      Brief office visit at [facility] to request assistance in [facility] [address], [place] to [facility], [address] [place]; [phone]. Change requested due to pharmacy's "Free home delivery" service. A telephone request will be made with patient's permission. Ferol LuzMarietta Jakeob Tullis, RN/CN

## 2017-10-07 ENCOUNTER — Encounter: Payer: Self-pay | Admitting: Family Medicine

## 2017-12-18 ENCOUNTER — Other Ambulatory Visit: Payer: Self-pay

## 2017-12-18 DIAGNOSIS — E118 Type 2 diabetes mellitus with unspecified complications: Secondary | ICD-10-CM

## 2017-12-18 MED ORDER — GABAPENTIN 300 MG PO CAPS: 300.0000 mg | ORAL_CAPSULE | Freq: Three times a day (TID) | ORAL | 3 refills | Status: DC

## 2017-12-18 NOTE — Telephone Encounter (Signed)
Pharmacist called for refill on Gabapentin. This is new pharmacy for patient.   Ples Specter, RN [facility] RN)

## 2018-01-28 ENCOUNTER — Telehealth: Payer: Self-pay | Admitting: Licensed Clinical Social Worker

## 2018-01-28 NOTE — Progress Notes (Signed)
LCSW received call from Ms. Riley Lam RN with Congregational Nursing for care coordination. Patient has an appointment with his provider Nov. 22nd  Ms. Riley Lam would like to get him connected for therapy to manage his grief.  LSCW assessed need and explained IBH Services at family medicine. Patient may benefit from Hospice counseling services to assist him with managing grief due to death of mulit family members. Plan:  1. Ms Riley Lam will have patient meet with Congregational social worker today to explore other options.  2. Ms. Riley Lam will call Hospice to set up appointment for patient. 3. LCSW available during office visit with PCP 11/22 if needed.  Sammuel Hines, LCSW Behavioral Health Clinician [facility]   [phone] 11:45 AM

## 2018-02-07 ENCOUNTER — Ambulatory Visit: Payer: Medicaid Other | Admitting: Family Medicine

## 2018-02-10 ENCOUNTER — Ambulatory Visit: Payer: Medicaid Other | Admitting: Family Medicine

## 2018-03-03 NOTE — Congregational Nurse Program (Signed)
[facility] requested I see client related to a recent trauma.  [name] reports that in recent days he has received word that several of his children were murdered [place]  He is not sleeping and is experiencing much anxiety.  Encouraged client to see his PCP to consider a sleep aid during this difficult time.  Also referred for counseling

## 2018-03-26 ENCOUNTER — Encounter (HOSPITAL_COMMUNITY): Payer: Self-pay | Admitting: *Deleted

## 2018-03-26 ENCOUNTER — Emergency Department (HOSPITAL_COMMUNITY)
Admission: EM | Admit: 2018-03-26 | Discharge: 2018-03-26 | Disposition: A | Discharge status: Home / Self Care | Payer: Medicaid Other | Attending: Emergency Medicine | Admitting: Emergency Medicine

## 2018-03-26 ENCOUNTER — Emergency Department (HOSPITAL_COMMUNITY): Payer: Medicaid Other

## 2018-03-26 DIAGNOSIS — I509 Heart failure, unspecified: Secondary | ICD-10-CM | POA: Diagnosis not present

## 2018-03-26 DIAGNOSIS — Z79899 Other long term (current) drug therapy: Secondary | ICD-10-CM | POA: Insufficient documentation

## 2018-03-26 DIAGNOSIS — Z7984 Long term (current) use of oral hypoglycemic drugs: Secondary | ICD-10-CM | POA: Insufficient documentation

## 2018-03-26 DIAGNOSIS — E860 Dehydration: Secondary | ICD-10-CM

## 2018-03-26 DIAGNOSIS — Z7982 Long term (current) use of aspirin: Secondary | ICD-10-CM | POA: Insufficient documentation

## 2018-03-26 DIAGNOSIS — R05 Cough: Secondary | ICD-10-CM | POA: Diagnosis not present

## 2018-03-26 DIAGNOSIS — I10 Essential (primary) hypertension: Secondary | ICD-10-CM | POA: Diagnosis not present

## 2018-03-26 DIAGNOSIS — E119 Type 2 diabetes mellitus without complications: Secondary | ICD-10-CM | POA: Insufficient documentation

## 2018-03-26 DIAGNOSIS — E1165 Type 2 diabetes mellitus with hyperglycemia: Secondary | ICD-10-CM | POA: Diagnosis not present

## 2018-03-26 DIAGNOSIS — I110 Hypertensive heart disease with heart failure: Secondary | ICD-10-CM | POA: Diagnosis not present

## 2018-03-26 DIAGNOSIS — R739 Hyperglycemia, unspecified: Secondary | ICD-10-CM

## 2018-03-26 LAB — URINALYSIS, ROUTINE W REFLEX MICROSCOPIC
BACTERIA UA: NONE SEEN
Bilirubin Urine: NEGATIVE
HGB URINE DIPSTICK: NEGATIVE
KETONES UR: 5 mg/dL — AB
LEUKOCYTES UA: NEGATIVE
NITRITE: NEGATIVE
PH: 5 (ref 5.0–8.0)
PROTEIN: NEGATIVE mg/dL
Specific Gravity, Urine: 1.016 (ref 1.005–1.030)

## 2018-03-26 LAB — I-STAT TROPONIN, ED: Troponin i, poc: 0 ng/mL (ref 0.00–0.08)

## 2018-03-26 LAB — I-STAT VENOUS BLOOD GAS, ED
Bicarbonate: 24.4 mmol/L (ref 20.0–28.0)
O2 Saturation: 83 %
TCO2: 26 mmol/L (ref 22–32)
pCO2, Ven: 39.3 mmHg — ABNORMAL LOW (ref 44.0–60.0)
pH, Ven: 7.401 (ref 7.250–7.430)
pO2, Ven: 48 mmHg — ABNORMAL HIGH (ref 32.0–45.0)

## 2018-03-26 LAB — CBG MONITORING, ED
GLUCOSE-CAPILLARY: 292 mg/dL — AB (ref 70–99)
Glucose-Capillary: 231 mg/dL — ABNORMAL HIGH (ref 70–99)

## 2018-03-26 LAB — CBC
HCT: 45 % (ref 39.0–52.0)
HEMOGLOBIN: 15 g/dL (ref 13.0–17.0)
MCH: 27.2 pg (ref 26.0–34.0)
MCHC: 33.3 g/dL (ref 30.0–36.0)
MCV: 81.7 fL (ref 80.0–100.0)
NRBC: 0 % (ref 0.0–0.2)
PLATELETS: 198 10*3/uL (ref 150–400)
RBC: 5.51 MIL/uL (ref 4.22–5.81)
RDW: 12.4 % (ref 11.5–15.5)
WBC: 5.5 10*3/uL (ref 4.0–10.5)

## 2018-03-26 LAB — COMPREHENSIVE METABOLIC PANEL
ALBUMIN: 3.7 g/dL (ref 3.5–5.0)
ALK PHOS: 68 U/L (ref 38–126)
ALT: 31 U/L (ref 0–44)
AST: 20 U/L (ref 15–41)
Anion gap: 9 (ref 5–15)
BUN: 14 mg/dL (ref 8–23)
CALCIUM: 8.9 mg/dL (ref 8.9–10.3)
CHLORIDE: 100 mmol/L (ref 98–111)
CO2: 23 mmol/L (ref 22–32)
CREATININE: 0.78 mg/dL (ref 0.61–1.24)
GFR calc Af Amer: >60 mL/min (ref >60)
GFR calc non Af Amer: >60 mL/min (ref >60)
GLUCOSE: 315 mg/dL — AB (ref 70–99)
Potassium: 4.3 mmol/L (ref 3.5–5.1)
SODIUM: 132 mmol/L — AB (ref 135–145)
Total Bilirubin: 0.8 mg/dL (ref 0.3–1.2)
Total Protein: 7.1 g/dL (ref 6.5–8.1)

## 2018-03-26 LAB — INFLUENZA PANEL BY PCR (TYPE A & B)
Influenza A By PCR: NEGATIVE
Influenza B By PCR: NEGATIVE

## 2018-03-26 MED ORDER — SODIUM CHLORIDE 0.9 % IV BOLUS
1000.0000 mL | Freq: Once | INTRAVENOUS | Status: AC
  Administered 2018-03-26: 1000 mL via INTRAVENOUS

## 2018-03-26 MED ORDER — SODIUM CHLORIDE 0.9 % IV BOLUS
500.0000 mL | Freq: Once | INTRAVENOUS | Status: AC
  Administered 2018-03-26: 500 mL via INTRAVENOUS

## 2018-03-26 NOTE — ED Notes (Signed)
Patient transported to X-ray 

## 2018-03-26 NOTE — ED Triage Notes (Signed)
Pt in via EMS c/o possible dehydration, pt states his mouth is dry and he is not drinking enough water, denies n/v/d, denies pain

## 2018-03-26 NOTE — ED Provider Notes (Signed)
[facility]   CSN: [id number] Arrival date & time: 03/26/18  0907     History   Chief Complaint Chief Complaint  Patient presents with  . Dehydration    HPI Osborne Omanorozi Lant is a 63 y.o. male.  HPI 63 year old male with past medical history significant for hypertension, alcohol abuse and diabetes presents to the ED by EMS for evaluation of dehydration.  Interpreter was used given the patient speaks Swahili.  Patient states that he feels dehydrated and dry.  States that his mouth is very dry.  He states that he is trying to drink plenty of water however he believes he needs IV fluids.  Patient reports generalized body aches and cramps.  He reports having a nonproductive cough.  And he denies any chest pain or shortness of breath.  Denies any nausea or vomiting.  Denies any diarrhea.  Does have history of diabetes and is on oral medication for this.  Takes medication as prescribed but states that his blood sugars have been somewhat high.  Patient reports some intermittent lightheadedness and dizziness with position changes.  Patient has not tried anything for symptoms at home prior to arrival.  There is no alleviating or aggravating factors. Past Medical History:  Diagnosis Date  . Alcohol abuse   . Diabetes mellitus without complication (HCC)   . Hypertension     Patient Active Problem List   Diagnosis Date Noted  . Hyperlipidemia 09/11/2017  . Greater trochanteric bursitis of right hip 06/12/2017  . Left hand pain 09/04/2016  . Left upper extremity numbness 12/21/2015  . Elbow pain 11/30/2015  . Achilles tendinosis 09/19/2015  . Cataract 07/04/2015  . HTN (hypertension) 06/22/2015  . Constipation 03/22/2015  . CHF (congestive heart failure) (HCC) 12/31/2014  . Language barrier 10/11/2014  . T2DM (type 2 diabetes mellitus) (HCC) 08/26/2014  . History of alcohol abuse 08/26/2014  . Knee pain, bilateral 08/26/2014    Past  Surgical History:  Procedure Laterality Date  . CATARACT EXTRACTION Right 09/22/15  . TUMOR REMOVAL Left March 2017   upper arm        Home Medications    Prior to Admission medications   Medication Sig Start Date End Date Taking? Authorizing Provider  ACCU-CHEK SOFTCLIX LANCETS lancets use to check blood sugar in the morning before breakfast, 2 hours after lunch and dinner 06/18/16   Erasmo DownerBacigalupo, Angela M, MD  aspirin 81 MG EC tablet Take 1 tablet (81 mg total) by mouth daily. 05/22/16   Erasmo DownerBacigalupo, Angela M, MD  atorvastatin (LIPITOR) 40 MG tablet Take 1 tablet by mouth daily at 6 pm 09/11/17   Almon HerculesGonfa, Taye T, MD  gabapentin (NEURONTIN) 300 MG capsule Take 1 capsule (300 mg total) by mouth 3 (three) times daily. 12/18/17   Marthenia RollingBland, Scott, DO  glipiZIDE (GLUCOTROL) 10 MG tablet Take 1 tablet (10 mg total) by mouth 2 (two) times daily before a meal. 09/11/17   Almon HerculesGonfa, Taye T, MD  glucose blood (ACCU-CHEK AVIVA) test strip Use to check your blood sugar in the morning before breakfast, 2 hours after lunch and dinner 04/19/16   Erasmo DownerBacigalupo, Angela M, MD  Lancet Devices (AUTOLET LANCING DEVICE) MISC use to check blood sugar in the morning before breakfast, 2 hours after lunch and dinner 07/13/15   Erasmo DownerBacigalupo, Angela M, MD  lisinopril (PRINIVIL,ZESTRIL) 5 MG tablet Take 1 tablet (5 mg total) by mouth daily. 09/11/17   Almon HerculesGonfa, Taye T, MD  metFORMIN (GLUCOPHAGE) 1000 MG  tablet Take 1 tablet (1,000 mg total) by mouth 2 (two) times daily with a meal. 09/11/17   Almon HerculesGonfa, Taye T, MD  Misc. Devices (DIGITAL GLASS SCALE) MISC Check weight daily 07/13/15   Latrelle DodrillMcIntyre, Brittany J, MD  polyethylene glycol powder (GLYCOLAX/MIRALAX) powder Take 17 g by mouth daily. 09/11/17   Almon HerculesGonfa, Taye T, MD  Probiotic Product (PROBIOTIC DAILY PO) Take 1 tablet by mouth 2 (two) times daily.    [provider]    Family History Family History  Family history unknown: Yes    Social History Social History   Tobacco Use  . Smoking  status: Never Smoker  . Smokeless tobacco: Never Used  Substance Use Topics  . Alcohol use: No    Alcohol/week: 0.0 standard drinks  . Drug use: No     Allergies   Patient has no known allergies.   Review of Systems Review of Systems  Constitutional: Negative for chills and fever.  HENT: Negative for congestion.   Eyes: Negative for discharge.  Respiratory: Positive for cough. Negative for shortness of breath and wheezing.   Cardiovascular: Negative for chest pain.  Gastrointestinal: Negative for abdominal pain, diarrhea, nausea and vomiting.  Endocrine: Positive for polyphagia and polyuria.  Genitourinary: Negative for dysuria, frequency, hematuria and urgency.  Musculoskeletal: Negative for myalgias.  Skin: Negative for rash.  Neurological: Positive for dizziness and light-headedness. Negative for syncope, weakness and headaches.     Physical Exam Updated Vital Signs BP 118/77 (BP Location: Left Arm)   Pulse 64   Temp 98.1 F (36.7 C) (Oral)   Resp 14   SpO2 99%   Physical Exam Vitals signs and nursing note reviewed.  Constitutional:      General: He is not in acute distress.    Appearance: He is well-developed. He is not toxic-appearing.  HENT:     Head: Normocephalic and atraumatic.     Mouth/Throat:     Mouth: Mucous membranes are dry.  Eyes:     General:        Right eye: No discharge.        Left eye: No discharge.     Conjunctiva/sclera: Conjunctivae normal.     Pupils: Pupils are equal, round, and reactive to light.  Neck:     Musculoskeletal: Normal range of motion and neck supple.  Cardiovascular:     Rate and Rhythm: Normal rate and regular rhythm.     Heart sounds: Normal heart sounds. No murmur. No friction rub. No gallop.   Pulmonary:     Effort: Pulmonary effort is normal. No respiratory distress.     Breath sounds: Normal breath sounds. No stridor. No wheezing, rhonchi or rales.  Chest:     Chest wall: No tenderness.  Abdominal:      General: Bowel sounds are normal. There is no distension.     Palpations: Abdomen is soft.     Tenderness: There is no abdominal tenderness. There is no right CVA tenderness, left CVA tenderness, guarding or rebound.  Musculoskeletal: Normal range of motion.        General: No tenderness.  Lymphadenopathy:     Cervical: No cervical adenopathy.  Skin:    General: Skin is warm and dry.     Capillary Refill: Capillary refill takes less than 2 seconds.     Findings: No rash.  Neurological:     Mental Status: He is alert and oriented to person, place, and time.  Psychiatric:  Mood and Affect: Mood normal.        Behavior: Behavior normal.        Thought Content: Thought content normal.        Judgment: Judgment normal.      ED Treatments / Results  Labs (all labs ordered are listed, but only abnormal results are displayed) Labs Reviewed  COMPREHENSIVE METABOLIC PANEL - Abnormal; Notable for the following components:      Result Value   Sodium 132 (*)    Glucose, Bld 315 (*)    All other components within normal limits  URINALYSIS, ROUTINE W REFLEX MICROSCOPIC - Abnormal; Notable for the following components:   Glucose, UA >=500 (*)    Ketones, ur 5 (*)    All other components within normal limits  I-STAT VENOUS BLOOD GAS, ED - Abnormal; Notable for the following components:   pCO2, Ven 39.3 (*)    pO2, Ven 48.0 (*)    All other components within normal limits  CBC  INFLUENZA PANEL BY PCR (TYPE A & B)  CBG MONITORING, ED  I-STAT TROPONIN, ED  CBG MONITORING, ED    EKG EKG Interpretation  Date/Time:  Wednesday March 26 2018 09:49:46 EST Ventricular Rate:  78 PR Interval:  152 QRS Duration: 80 QT Interval:  362 QTC Calculation: 412 R Axis:   4 Text Interpretation:  Normal sinus rhythm Possible Anterior infarct , age undetermined T wave abnormality, consider inferior ischemia Abnormal ECG No significant change was found Confirmed by Azalia Bilis ([id number]) on  03/26/2018 11:20:38 AM   Radiology Dg Chest 2 View  Result Date: 03/26/2018 CLINICAL DATA:  Cough.  Dry mouth. EXAM: CHEST - 2 VIEW COMPARISON:  Single-view of the chest 06/21/2015. FINDINGS: The lungs are clear. Heart size is normal. No pneumothorax or pleural fluid. No acute or focal bony abnormality. IMPRESSION: Negative chest. Electronically Signed   By: Drusilla Kanner M.D.   On: 03/26/2018 10:17    Procedures Procedures (including critical care time)  Medications Ordered in ED Medications  sodium chloride 0.9 % bolus 1,000 mL (0 mLs Intravenous Stopped 03/26/18 1252)  sodium chloride 0.9 % bolus 500 mL (500 mLs Intravenous New Bag/Given 03/26/18 1100)     Initial Impression / Assessment and Plan / ED Course  I have reviewed the triage vital signs and the nursing notes.  Pertinent labs & imaging results that were available during my care of the patient were reviewed by me and considered in my medical decision making (see chart for details).     Patient presents to the ED for evaluation of possible dehydration.  Patient is history as documented above.  Patient's vital signs are reassuring.  No focal neurological deficit on exam.  Neurovascular intact in all extremities.  Patient has heart is regular rate and rhythm.  Lungs with auscultation bilaterally.  No focal abdominal tenderness.  Labs show no leukocytosis.  Hemoglobin at baseline.  No significant electrolyte derangement.  Mild elevation in glucose of 350.  Normal anion gap and bicarb with minimal ketones in urine.  Low suspicion for DKA.  Patient's pH without any acidosis.  Influenza was negative.  Chest x-ray showed no signs of pneumonia as reviewed by myself.  No signs of pulmonary edema.  EKG performed shows normal sinus rhythm with some T wave and normalities in V1 this appears similar to prior tracing any ST elevation or depression.  Troponin was negative.  Presentation not consistent with ACS, PE or dissection.  Suspect  dehydration.  Again no signs of DKA.  Patient given fluids in the ED.  Blood sugar improved.  Patient able to ambulate in the ED without any difficulties and does not complain of any dizziness or lightheadedness.  Doubt CVA or ICH at this time.  Patient to follow-up in outpatient setting with primary care doctor for further blood sugar management.  Pt is hemodynamically stable, in NAD, & able to ambulate in the ED. Evaluation does not show pathology that would require ongoing emergent intervention or inpatient treatment. I explained the diagnosis to the patient. Pain has been managed & has no complaints prior to dc. Pt is comfortable with above plan and is stable for discharge at this time. All questions were answered prior to disposition. Strict return precautions for f/u to the ED were discussed. Encouraged follow up with PCP.   Final Clinical Impressions(s) / ED Diagnoses   Final diagnoses:  Dehydration  Hyperglycemia    ED Discharge Orders    None       Wallace Keller 03/26/18 1404    Azalia Bilis, MD 03/26/18 [phone]

## 2018-03-26 NOTE — Discharge Instructions (Addendum)
Your work-up has been reassuring.  Your blood sugar is slightly high.  Continue taking your medication at home.  Follow-up with your primary care doctor to have your blood sugar rechecked to make sure that you do not need to increase your medication.  Please drink plenty of fluids stay hydrated.  Follow-up with your primary care doctor.  Return to the ED with any worsening symptoms.

## 2018-04-15 NOTE — Congregational Nurse Program (Signed)
Invited to [facility] nurse office to offer support for family. Notification by [facility] Family Coordinator that family recently received news from the [place] that several family members were murdered, including his father. Freely expressed grief. Denied any feelings of suicide. States " I"m just trying to get myself together". Vitals stable. BS 200 fasting at 12noon.  Acknowledges that counseling will help and aware of  Mental Health nurse available. Agreed to PCP appointment for evaluation of sleeplessness. States "I have a lot going on".....son has appointment for recent arm fracture. Recommend counseling by Lattie Haw, Mental Health Nurse. Consulted with [facility]al Health Nurse, Gavin Pound. Appointment scheduled with [facility] Dr. Parke Simmers, PCP; 02/07/18 at 9:20 am. Follow-up contact at [facility] office prn. Ferol Luz, RN/CN

## 2018-04-15 NOTE — Congregational Nurse Program (Signed)
Office visit at [facility] nurse office for this [place] man requested an appointment to see PCP last week.  Speaks Swahili, but able to converse in [place] with basic conversation usually; used [employer] today. Complained of  Water/utility problems in housing unit initially, but noted to have health issues with vague complaint of not feeling well and weak. Reports seeking treatment at [facility] ER recently. Scheduled for 04/25/18 appointment at [facility] at 9:10 am. After evaluation of blood sugar level twice at 570 and higher,  recommended treatment at [facility] or PCP. Observed lips dry and dehydrated.  Immediately refused referral to ER and agreed to change appointment earlier to 04/18/18 at 9:10 am. Very convicted about taking medication for diabetes upon arrival home. Warned of danger of not seeking medical care immediately for elevated BS. Hydrate with water immediately and take medications. Seek emergency services if condition worsens. Return to nurse office in am before attending class. Will discuss need to follow-up with Mental Health nurse for counseling. Speak to agency SW regarding housing issues.  Ferol Luz, RN/CN

## 2018-04-16 NOTE — Congregational Nurse Program (Signed)
Brief return visit to nurse office at [facility] to follow-up blood sugar as advised. Reports home testing of blood sugar 220 at 8:30 am today fasting. Took medications as directed. Describes condition as "a little better; still don't feel good". CBG fasting 265 at 10:45 am in nurse office. Stressed increasing water intake and increasing veggies in diet; meal preparation with choice of cultural foods continues to be an issue possibly.  Continue to discuss diet and diabetes during nurse visits. See PCP at [facility] on 04/18/18 at 9:10 am for concerns and re-evaluation. Return to nurse office prn. Ferol Luz, RN/CN. [phone]

## 2018-04-18 ENCOUNTER — Ambulatory Visit: Payer: Medicaid Other | Admitting: Family Medicine

## 2018-04-24 ENCOUNTER — Other Ambulatory Visit: Payer: Self-pay

## 2018-04-24 DIAGNOSIS — E785 Hyperlipidemia, unspecified: Secondary | ICD-10-CM

## 2018-04-24 DIAGNOSIS — E118 Type 2 diabetes mellitus with unspecified complications: Secondary | ICD-10-CM

## 2018-04-25 ENCOUNTER — Ambulatory Visit: Payer: Medicaid Other | Admitting: Family Medicine

## 2018-04-25 MED ORDER — ATORVASTATIN CALCIUM 40 MG PO TABS: ORAL_TABLET | ORAL | 3 refills | Status: DC

## 2018-04-25 MED ORDER — GLIPIZIDE 10 MG PO TABS: 10.0000 mg | ORAL_TABLET | Freq: Two times a day (BID) | ORAL | 0 refills | Status: DC

## 2018-04-25 NOTE — Addendum Note (Signed)
Addended by: Jone Baseman D on: 04/25/2018 09:02 AM   Modules accepted: Orders

## 2018-04-25 NOTE — Telephone Encounter (Signed)
First transmission failed, resent. Fleeger, Jessica Dawn, CMA  

## 2018-04-25 NOTE — Telephone Encounter (Signed)
Called to pharmacy.  Alisa Brake, RN ([facility] RN)  

## 2018-04-28 ENCOUNTER — Encounter: Payer: Self-pay | Admitting: Family Medicine

## 2018-04-28 ENCOUNTER — Ambulatory Visit: Payer: Medicaid Other | Admitting: Family Medicine

## 2018-04-28 ENCOUNTER — Other Ambulatory Visit: Payer: Self-pay

## 2018-04-28 VITALS — BP 96/62 | HR 73 | Temp 98.9°F | Wt 162.0 lb

## 2018-04-28 DIAGNOSIS — E118 Type 2 diabetes mellitus with unspecified complications: Secondary | ICD-10-CM

## 2018-04-28 DIAGNOSIS — E1169 Type 2 diabetes mellitus with other specified complication: Secondary | ICD-10-CM | POA: Diagnosis not present

## 2018-04-28 DIAGNOSIS — E785 Hyperlipidemia, unspecified: Secondary | ICD-10-CM | POA: Diagnosis not present

## 2018-04-28 LAB — POCT GLYCOSYLATED HEMOGLOBIN (HGB A1C): HbA1c POC (<> result, manual entry): >15 % (ref 4.0–5.6)

## 2018-04-28 MED ORDER — INSULIN GLARGINE 100 UNIT/ML ~~LOC~~ SOLN: 20.0000 [IU] | Freq: Every day | SUBCUTANEOUS | 3 refills | Status: DC

## 2018-04-28 MED ORDER — LISINOPRIL 5 MG PO TABS: 5.0000 mg | ORAL_TABLET | Freq: Every day | ORAL | 3 refills | Status: DC

## 2018-04-28 MED ORDER — METFORMIN HCL 1000 MG PO TABS: 1000.0000 mg | ORAL_TABLET | Freq: Two times a day (BID) | ORAL | 5 refills | Status: DC

## 2018-04-28 MED ORDER — ATORVASTATIN CALCIUM 40 MG PO TABS: ORAL_TABLET | ORAL | 3 refills | Status: DC

## 2018-04-28 MED ORDER — GABAPENTIN 300 MG PO CAPS: 300.0000 mg | ORAL_CAPSULE | Freq: Three times a day (TID) | ORAL | 3 refills | Status: DC

## 2018-04-28 NOTE — Progress Notes (Signed)
   Subjective:    Randy Fisher - 63 y.o. male MRN [id number]  Date of birth: 1955-08-30  Interview assisted by [facility] interpreter York Spaniel [phone]  CC:  Randy Fisher is here for sleep difficulty and stress.  He was also recently seen in the emergency department and found to have dehydration with glucose measurements in the 300s, without DKA.  HPI: Reports that he has not been eating well and has had a "burning sensation" throughout his body, so he went to the ED on 1/8.  Since then, his appetite has continued to suffer.  He reports that his mouth is often dry and he drinks several bottles of water per day.  He also reports that he is urinating very frequently.  He has been taking glipizide 15 mg BID and metformin 1,000 mg BID.  He says that his fasting blood sugars have ranged from 180-300s at home.  Health Maintenance:  Health Maintenance Due  Topic Date Due  . FOOT EXAM  09/04/2017  . HEMOGLOBIN A1C  03/13/2018  . PNA vac Low Risk Adult (1 of 2 - PCV13) 03/19/2018    -  reports that he has never smoked. He has never used smokeless tobacco. - Review of Systems: Per HPI. - Past Medical History: Patient Active Problem List   Diagnosis Date Noted  . Hyperlipidemia 09/11/2017  . Greater trochanteric bursitis of right hip 06/12/2017  . Left hand pain 09/04/2016  . Left upper extremity numbness 12/21/2015  . Elbow pain 11/30/2015  . Achilles tendinosis 09/19/2015  . Cataract 07/04/2015  . HTN (hypertension) 06/22/2015  . Constipation 03/22/2015  . CHF (congestive heart failure) (HCC) 12/31/2014  . Language barrier 10/11/2014  . T2DM (type 2 diabetes mellitus) (HCC) 08/26/2014  . History of alcohol abuse 08/26/2014  . Knee pain, bilateral 08/26/2014   - Medications: reviewed and updated   Objective:   Physical Exam BP 96/62   Pulse 73   Temp 98.9 F (37.2 C) (Oral)   Wt 162 lb (73.5 kg)   SpO2 99%   BMI 26.15 kg/m  Gen: NAD, alert, cooperative with exam,  well-appearing HEENT: slightly dry mucous membranes CV: RRR, good S1/S2, no murmur Resp: CTABL, no wheezes, non-labored Skin: no rashes, normal turgor  Psych: good insight, alert and oriented        Assessment & Plan:   T2DM (type 2 diabetes mellitus) (HCC) Hemoglobin A1c is greater than 15 today.  Although patient has been taking 15 mg glipizide twice daily and metformin 1000 mg twice daily, he has had very poor glucose control and has symptoms of hyperglycemia.  We will stop glipizide and start Lantus today.  Insulin requirement is around 40 units given his body weight of 160 pounds, so we will start out with Lantus 20 units each morning.  Patient was counseled that if he has a fasting blood sugar of greater than 150, he should increase his Lantus dose by 1 unit.  Would like to see him back in 2 weeks in [facility].  He may also benefit from addition of an SGLT2 inhibitor in the future.  She was counseled to record his blood sugars multiple times per day and bring the log into his next appointment.  He was also given a handout and Swahili about diabetes self-care and nutrition.  Chronic medications, including metformin, were refilled.    Randy Fisher, M.D. 04/28/2018, 9:56 AM PGY-2, [facility]

## 2018-04-28 NOTE — Patient Instructions (Addendum)
It was nice meeting you today Randy Fisher!   If you have any questions or concerns, please feel free to call the clinic.   Be well,  Dr. Frances Furbish

## 2018-04-28 NOTE — Assessment & Plan Note (Addendum)
Hemoglobin A1c is greater than 15 today.  Although patient has been taking 15 mg glipizide twice daily and metformin 1000 mg twice daily, he has had very poor glucose control and has symptoms of hyperglycemia.  We will stop glipizide and start Lantus today.  Insulin requirement is around 40 units given his body weight of 160 pounds, so we will start out with Lantus 20 units each morning.  Patient was counseled that if he has a fasting blood sugar of greater than 150, he should increase his Lantus dose by 1 unit.  Would like to see him back in 2 weeks in [facility] clinic.  He may also benefit from addition of an SGLT2 inhibitor in the future.  She was counseled to record his blood sugars multiple times per day and bring the log into his next appointment.  He was also given a handout and Swahili about diabetes self-care and nutrition.  Chronic medications, including metformin, were refilled.

## 2018-05-07 ENCOUNTER — Other Ambulatory Visit: Payer: Self-pay

## 2018-05-07 ENCOUNTER — Ambulatory Visit: Payer: Medicaid Other

## 2018-05-07 NOTE — Progress Notes (Signed)
Patient brought children for flu shots. Note given to him for missing class.  Ples Specter, RN [facility] RN)

## 2018-05-28 ENCOUNTER — Encounter: Payer: Self-pay | Admitting: Family Medicine

## 2018-05-28 ENCOUNTER — Other Ambulatory Visit: Payer: Self-pay

## 2018-05-28 ENCOUNTER — Ambulatory Visit (INDEPENDENT_AMBULATORY_CARE_PROVIDER_SITE_OTHER): Payer: Medicaid Other | Admitting: Family Medicine

## 2018-05-28 VITALS — BP 110/72 | HR 78 | Temp 98.3°F | Wt 165.0 lb

## 2018-05-28 DIAGNOSIS — E1169 Type 2 diabetes mellitus with other specified complication: Secondary | ICD-10-CM

## 2018-05-28 NOTE — Patient Instructions (Signed)
It was a pleasure to see you today! Thank you for choosing [facility] for your primary care. Randy Fisher was seen for diabetes management.   Today we talked about your diabetes management.  It seems like you have been taking approximately 12 units of insulin.  With these doses you have had blood sugars with a lowest of 177 but mostly up in the high 200s.  Would like you to increase your insulin to 15 units/day.  You are going to come back and see a pharmacist in roughly 1 week to look at these doses and consider additional medication.  If your sugars go below 125 please do not take your insulin that morning and call in the clinic immediately.   Please bring all your medications to every doctors visit   Sign up for My Chart to have easy access to your labs results, and communication with your Primary care physician.     Please check-out at the front desk before leaving the clinic.     Best,  Dr. Marthenia Rolling FAMILY MEDICINE RESIDENT - PGY2 05/28/2018 10:07 AM

## 2018-05-30 MED ORDER — INSULIN GLARGINE 100 UNIT/ML ~~LOC~~ SOLN: 15.0000 [IU] | Freq: Every day | SUBCUTANEOUS | 0 refills | Status: DC

## 2018-05-30 NOTE — Progress Notes (Signed)
    Subjective:  Randy Fisher is a 62 y.o. male who presents to the [facility] today with a chief complaint of diabetes management.   HPI: T2DM (type 2 diabetes mellitus) (HCC) Patient with daily recorded morning blood sugars prior to Lantus injection primarily in the upper 200s and 300s.  Lowest was 177.  After reviewing medication it seems that he has been consistent about taking his Lantus in the morning, but has only been taking approximately 12 units as opposed to the prescribed 20.  When trying to discuss this through the Swahili interpreter there appears to be some confusion where [name] also has other meetings in [place].  I am still not sure that I understand that confusion.   Objective:  Physical Exam: BP 110/72   Pulse 78   Temp 98.3 F (36.8 C) (Oral)   Wt 165 lb (74.8 kg)   SpO2 98%   BMI 26.63 kg/m   Gen: NAD, resting comfortably CV: RRR with no murmurs appreciated Pulm: NWOB, CTAB with no crackles, wheezes, or rhonchi MSK: no edema, cyanosis, or clubbing noted Skin: warm, dry Neuro: grossly normal, moves all extremities Psych: Normal affect and thought content  No results found for this or any previous visit (from the past 72 hour(s)).   Assessment/Plan:  T2DM (type 2 diabetes mellitus) (HCC) Patient with daily recorded morning blood sugars prior to Lantus injection primarily in the upper 200s and 300s.  Lowest was 177.  After reviewing medication it seems that he has been consistent about taking his Lantus in the morning, but has only been taking approximately 12 units as opposed to the prescribed 20.  When trying to discuss this through the Swahili interpreter there appears to be some confusion where [name] also has other meetings in [place].  I am still not sure that I understand that confusion.  Patient instructed to take 15 units, we drew this out on her chart so that he could identify the number on a syringe.  We also asked him to schedule a follow-up  appointment with either pharmacy or his primary care doctor in the next week so that we could re-dose his insulin after we see how he is doing on 15.  Will likely need SGL 2 but given prior confusion did not want to make 2 changes in the same visit.   Marthenia Rolling, DO FAMILY MEDICINE RESIDENT - PGY2 05/30/2018 9:02 AM

## 2018-05-30 NOTE — Assessment & Plan Note (Signed)
Patient with daily recorded morning blood sugars prior to Lantus injection primarily in the upper 200s and 300s.  Lowest was 177.  After reviewing medication it seems that he has been consistent about taking his Lantus in the morning, but has only been taking approximately 12 units as opposed to the prescribed 20.  When trying to discuss this through the Swahili interpreter there appears to be some confusion where [name] also has other meetings in [place].  I am still not sure that I understand that confusion.  Patient instructed to take 15 units, we drew this out on her chart so that he could identify the number on a syringe.  We also asked him to schedule a follow-up appointment with either pharmacy or his primary care doctor in the next week so that we could re-dose his insulin after we see how he is doing on 15.  Will likely need SGL 2 but given prior confusion did not want to make 2 changes in the same visit.

## 2018-06-05 ENCOUNTER — Other Ambulatory Visit: Payer: Self-pay

## 2018-06-05 ENCOUNTER — Ambulatory Visit (INDEPENDENT_AMBULATORY_CARE_PROVIDER_SITE_OTHER): Payer: Medicaid Other | Admitting: Pharmacist

## 2018-06-05 ENCOUNTER — Encounter: Payer: Self-pay | Admitting: Pharmacist

## 2018-06-05 DIAGNOSIS — E1169 Type 2 diabetes mellitus with other specified complication: Secondary | ICD-10-CM

## 2018-06-05 DIAGNOSIS — E785 Hyperlipidemia, unspecified: Secondary | ICD-10-CM | POA: Diagnosis not present

## 2018-06-05 MED ORDER — EMPAGLIFLOZIN 10 MG PO TABS: 10.0000 mg | ORAL_TABLET | Freq: Every day | ORAL | 5 refills | Status: DC

## 2018-06-05 NOTE — Assessment & Plan Note (Signed)
Diabetes control improving with recent increase in Lantus, however fasting CBG still not within goal of 80-130. Patient is able to verbalize appropriate hypoglycemia management plan. Patient is adherent with medication. Control is suboptimal due to poor diet intake. -Continue Lantus 20 units  once daily and metformin 1000 mg BID -Start empagliflozin (Jardiance) 10 mg one tablet by mouth once daily. -Bring eye drops to next visit -Extensively recommended lifestyle interventions, primarily with dietary effects on glycemic control and increasing the amount of protein in the diet -Counseled on s/sx of and management of hypoglycemia -Next A1C anticipated May 2020.

## 2018-06-05 NOTE — Assessment & Plan Note (Signed)
Hyperlipidemia previously uncontrolled with most recent lipid panel from 05/2015. Patient is adherent with medication.  -Repeat lipid panel and CMET today -May need to increase atorvastatin to 80 mg or add ezetimibe (Zetia) 10 mg daily pending results

## 2018-06-05 NOTE — Progress Notes (Addendum)
S:     Chief Complaint  Patient presents with  . Medication Management    diabetes    Patient arrives in good spirits, ambulating without assistance. Visit conducted with Enos Fling Interpreter [phone]. Presents for diabetes evaluation, education, and management at the request of PCP, Dr. Parke Simmers following visit on 05/28/2018.    Patient reports Diabetes was diagnosed in 2016 when he arrived in [place] from [place].  Insurance coverage/medication affordability: Medicaid  Patient reports adherence with medications.  Current diabetes medications include: Metformin 1000 mg BID AND Lantus 20 units most days in the AM.  Current hypertension medications include: Lisinopril   Patient denies hypoglycemic events.  Patient reported dietary habits: Eats 3 meals/day Breakfast: bananas cooked, with water Lunch: bananas and corn meal, [place] or more typically water Dinner: bananas cooked, cleaned vegetables (minimal amounts of: fish or chicken/meats) Snacks:  Minimal snacks ("casarva" in the past)   Patient-reported exercise habits: rides a bike for 1 hour - 2x per week.   Patient reports nocturia has reduced from frequently to now 2 times.  Patient denies neuropathy. Patient reports visual changes are improved with eye drops (he did NOT have them and needed more - asked to bring these eye drops to his next visit).  Patient denies self foot exams. (receptive to starting to check more often)   O:  Physical Exam Vitals signs reviewed.  Cardiovascular:     Rate and Rhythm: Normal rate.  Musculoskeletal:        General: No swelling.  Neurological:     Mental Status: He is alert.    Review of Systems  Eyes: Negative.   Cardiovascular: Positive for palpitations.  Genitourinary: Negative for frequency.       Nocturia is now 2x per night (less than in the past)  Musculoskeletal: Positive for back pain.  Neurological: Negative for dizziness and tingling.     Lab Results  Component  Value Date   HGBA1C >15.0 04/28/2018   Vitals:   06/05/18 0936  BP: 112/66  Pulse: 77  SpO2: 97%    Lipid Panel     Component Value Date/Time   CHOL 169 06/03/2015 1640   TRIG 171 (H) 06/03/2015 1640   HDL 29 (L) 06/03/2015 1640   CHOLHDL 5.8 (H) 06/03/2015 1640   VLDL 34 (H) 06/03/2015 1640   LDLCALC 106 06/03/2015 1640    Home fasting CBG: 170-217 with average AM reading ~ 200 for the last week. Lowest 126 2 hour post-prandial/random CBG: NOT currently testing.    A/P: Diabetes control improving with recent increase in Lantus, however fasting CBG still not within goal of 80-130. Patient is able to verbalize appropriate hypoglycemia management plan. Patient is adherent with medication. Control is suboptimal due to poor diet intake. -Continue Lantus 20 units [place] once daily and metformin 1000 mg BID -Start empagliflozin (Jardiance) 10 mg one tablet by mouth once daily. -Bring eye drops to next visit -Extensively recommended lifestyle interventions, primarily with dietary effects on glycemic control and increasing the amount of protein in the diet -Counseled on s/sx of and management of hypoglycemia -Next A1C anticipated May 2020.    Hypertension currently controlled on current regimen.  BP at goal <130/80 mmHg. Patient is adherent with medication. -Continue lisinopril 5 mg once daily   Hyperlipidemia previously uncontrolled with most recent lipid panel from 05/2015. Patient is adherent with medication.  -Repeat lipid panel and CMET today -May need to increase atorvastatin to 80 mg or add ezetimibe (Zetia)  10 mg daily pending results    Written patient instructions provided.  Total time in face to face counseling 56 minutes.   Follow up with PCP in 2 months.   Patient seen with Danae Orleans, PharmD, PGY-1 resident.    Lipid Panel shows  LDL 59 Trig 109 Total 126 No change indicated -  Patient much improved on Atorva 80mg .

## 2018-06-05 NOTE — Patient Instructions (Addendum)
It was nice to see you today!  Lab work today - we will call with any medicine changes.  Please start taking Jardiance 10 mg 1 tablet by mouth once daily.  Please try to increase the amount of protein in your diet (chicken, fish)  Call us if you see blood sugar less than 100 immediately.   Please come back to see Dr. Parke Simmers in 2 months for blood sugar follow up.

## 2018-06-05 NOTE — Progress Notes (Signed)
Patient ID: Randy Fisher, male   DOB: November 30, 1955, 63 y.o.   MRN: [id number] Reviewed: Agree with Dr. Macky Lower documentation and management.

## 2018-06-06 LAB — COMPREHENSIVE METABOLIC PANEL
ALT: 20 IU/L (ref 0–44)
AST: 15 IU/L (ref 0–40)
Albumin/Globulin Ratio: 1.5 (ref 1.2–2.2)
Albumin: 3.9 g/dL (ref 3.8–4.8)
Alkaline Phosphatase: 69 IU/L (ref 39–117)
BUN/Creatinine Ratio: 13 (ref 10–24)
BUN: 12 mg/dL (ref 8–27)
Bilirubin Total: 0.4 mg/dL (ref 0.0–1.2)
CALCIUM: 9.2 mg/dL (ref 8.6–10.2)
CO2: 25 mmol/L (ref 20–29)
Chloride: 101 mmol/L (ref 96–106)
Creatinine, Ser: 0.95 mg/dL (ref 0.76–1.27)
GFR calc Af Amer: 97 mL/min/{1.73_m2} (ref >59)
GFR calc non Af Amer: 84 mL/min/{1.73_m2} (ref >59)
GLUCOSE: 252 mg/dL — AB (ref 65–99)
Globulin, Total: 2.6 g/dL (ref 1.5–4.5)
Potassium: 4.9 mmol/L (ref 3.5–5.2)
Sodium: 138 mmol/L (ref 134–144)
Total Protein: 6.5 g/dL (ref 6.0–8.5)

## 2018-06-06 LAB — LIPID PANEL
Chol/HDL Ratio: 2.7 ratio (ref 0.0–5.0)
Cholesterol, Total: 126 mg/dL (ref 100–199)
HDL: 46 mg/dL (ref >39)
LDL Calculated: 59 mg/dL (ref 0–99)
Triglycerides: 105 mg/dL (ref 0–149)
VLDL Cholesterol Cal: 21 mg/dL (ref 5–40)

## 2018-06-17 ENCOUNTER — Other Ambulatory Visit: Payer: Self-pay | Admitting: *Deleted

## 2018-06-17 ENCOUNTER — Other Ambulatory Visit: Payer: Self-pay

## 2018-06-17 DIAGNOSIS — K5900 Constipation, unspecified: Secondary | ICD-10-CM

## 2018-06-17 DIAGNOSIS — E785 Hyperlipidemia, unspecified: Secondary | ICD-10-CM

## 2018-06-17 MED ORDER — ATORVASTATIN CALCIUM 40 MG PO TABS: ORAL_TABLET | ORAL | 3 refills | Status: DC

## 2018-06-17 MED ORDER — POLYETHYLENE GLYCOL 3350 17 GM/SCOOP PO POWD: 17.0000 g | Freq: Every day | ORAL | 6 refills | Status: DC

## 2018-06-20 ENCOUNTER — Other Ambulatory Visit: Payer: Self-pay

## 2018-06-20 NOTE — Congregational Nurse Program (Signed)
  Dept: [phone]   Congregational Nurse Program Note  Date of Encounter: 05/20/2018  Past Medical History: Past Medical History:  Diagnosis Date  . Alcohol abuse   . Diabetes mellitus without complication (HCC)   . Hypertension     Encounter Details: Visit to  Nurse office with concern regarding elevated blood sugars recently. Continues to maintain a home log of readings. Today's reading 281 fasting. No problem taking medication. Possibly experiencing problems choosing foods and consistently eating meals. Usually bring snack for 11:00 am break at school. Alert and able to express self well. Communicates in basic English but need Kinyarwanda interpreter for clarity. Continues to feel stressed regarding loss of several family members to violent death several months ago, but coping and aware of mental health counseling available. Confirmed appointment to see PCP on 05/28/18 at 9:30 am at [facility].Stressed daily diet of more fresh fruits and veggies; increase water hydration. Return as needed. Ferol Luz, RN/CN

## 2018-07-03 ENCOUNTER — Other Ambulatory Visit: Payer: Self-pay | Admitting: Family Medicine

## 2018-07-10 ENCOUNTER — Other Ambulatory Visit: Payer: Self-pay | Admitting: Family Medicine

## 2018-10-23 ENCOUNTER — Other Ambulatory Visit: Payer: Self-pay | Admitting: Family Medicine

## 2018-10-23 DIAGNOSIS — E1169 Type 2 diabetes mellitus with other specified complication: Secondary | ICD-10-CM

## 2018-11-17 ENCOUNTER — Other Ambulatory Visit: Payer: Self-pay | Admitting: Family Medicine

## 2018-11-18 ENCOUNTER — Telehealth: Payer: Self-pay | Admitting: Family Medicine

## 2018-11-18 NOTE — Telephone Encounter (Signed)
Please schedule patient for followup in person with any doctor or pharmacy for diabetes check.  -Dr. Criss Rosales

## 2018-12-16 NOTE — Congregational Nurse Program (Signed)
  Dept: [facility] Nurse Program Note  Date of Encounter: 12/16/2018  Past Medical History: Past Medical History:  Diagnosis Date  . Alcohol abuse   . Diabetes mellitus without complication ([place])   . Hypertension     Encounter Details: Visit to nurse office at [facility] after agency closure for COVID-19 since 3 months. Resumed English classes at school. States he did not check blood sugar today.  Afebrile. CBG 222 at 9:25 am fasting. Packs snack to eat at school. Referral to PCP for follow-up as scheduled for diabetes evaluation. Agrees with recommendation and appointment. Return for support prn.  Jannetta Quint, RN/CN

## 2018-12-18 ENCOUNTER — Telehealth: Payer: Self-pay | Admitting: *Deleted

## 2018-12-18 NOTE — Telephone Encounter (Signed)
Tried to contact pt to see about him getting an appointment scheduled.  His VM has not been set up.  If he calls back please assist him in getting an appointment scheduled.April Zimmerman Rumple, CMA

## 2018-12-18 NOTE — Telephone Encounter (Signed)
Error. Randy Fisher, CMA  

## 2018-12-24 ENCOUNTER — Other Ambulatory Visit: Payer: Self-pay

## 2018-12-30 ENCOUNTER — Other Ambulatory Visit: Payer: Self-pay | Admitting: Family Medicine

## 2019-02-09 ENCOUNTER — Other Ambulatory Visit: Payer: Self-pay

## 2019-02-09 DIAGNOSIS — K5900 Constipation, unspecified: Secondary | ICD-10-CM

## 2019-02-09 MED ORDER — POLYETHYLENE GLYCOL 3350 17 GM/SCOOP PO POWD: 17.0000 g | Freq: Every day | ORAL | 6 refills | Status: DC

## 2019-03-04 ENCOUNTER — Other Ambulatory Visit: Payer: Self-pay | Admitting: Family Medicine

## 2019-03-04 DIAGNOSIS — E785 Hyperlipidemia, unspecified: Secondary | ICD-10-CM

## 2019-03-04 NOTE — Telephone Encounter (Signed)
Refilling meds as requested, patient is overdue for diabetes follow-up.  I know we have tried in the past to schedule him unsuccessfully, we can try one more time  Dr. Criss Rosales

## 2019-03-31 ENCOUNTER — Other Ambulatory Visit: Payer: Self-pay | Admitting: Family Medicine

## 2019-03-31 DIAGNOSIS — E1169 Type 2 diabetes mellitus with other specified complication: Secondary | ICD-10-CM

## 2019-05-05 DIAGNOSIS — R7309 Other abnormal glucose: Secondary | ICD-10-CM

## 2019-05-05 LAB — GLUCOSE, POCT (MANUAL RESULT ENTRY): POC Glucose: 209 mg/dl — AB (ref 70–99)

## 2019-05-05 NOTE — Congregational Nurse Program (Signed)
Randy Fisher came in for blood sugar check. Same done. He has not taken his blood sugar medication today. He will be back tomorrow for a repeat. Arman Bogus RN BSN PCCN [phone]

## 2019-05-09 ENCOUNTER — Ambulatory Visit: Payer: Medicaid Other | Attending: Internal Medicine

## 2019-05-09 DIAGNOSIS — Z23 Encounter for immunization: Secondary | ICD-10-CM | POA: Insufficient documentation

## 2019-05-09 NOTE — Progress Notes (Signed)
   Covid-19 Vaccination Clinic  Name:  Randy Fisher    MRN: [id number] DOB: 04/07/1955  05/09/2019  [name] was observed post Covid-19 immunization for 15 minutes without incidence. He was provided with Vaccine Information Sheet and instruction to access the V-Safe system.   [name] was instructed to call 911 with any severe reactions post vaccine: Marland Kitchen Difficulty breathing  . Swelling of your face and throat  . A fast heartbeat  . A bad rash all over your body  . Dizziness and weakness    Immunizations Administered    Name Date Dose VIS Date Route   Pfizer COVID-19 Vaccine 05/09/2019 10:28 AM 0.3 mL 02/27/2019 Intramuscular   Manufacturer: [employer], [employer]   Lot: [id number]   NDC: [address]-6

## 2019-05-12 ENCOUNTER — Other Ambulatory Visit: Payer: Self-pay

## 2019-05-12 DIAGNOSIS — R7309 Other abnormal glucose: Secondary | ICD-10-CM

## 2019-05-12 LAB — GLUCOSE, POCT (MANUAL RESULT ENTRY): POC Glucose: 352 mg/dl — AB (ref 70–99)

## 2019-05-12 NOTE — Congregational Nurse Program (Signed)
  Dept: [phone]   Congregational Nurse Program Note  Date of Encounter: 05/12/2019  Past Medical History: Past Medical History:  Diagnosis Date  . Alcohol abuse   . Diabetes mellitus without complication (HCC)   . Hypertension     Encounter Details: CNP Questionnaire - 05/12/19 1100      Questionnaire   Patient Status  Refugee    Race  African    Location Patient Served At  [facility]    Insurance  Medicaid    Uninsured  Not Applicable    Food  Yes, have food insecurities    Housing/Utilities  Yes, have permanent housing    Transportation  No transportation needs    Interpersonal Safety  Yes, feel physically and emotionally safe where you currently live    Medication  No medication insecurities    Medical Provider  Yes    Referrals  Not Applicable    ED Visit Averted  Not Applicable    Life-Saving Intervention Made  Not Applicable      Randy Fisher has not taking his diabetes medication today. I have re educuated him on medication compliance,deit and exercise.I will recheck his blodd sugar again tomorrow. Arman Bogus RN BSN PCCN [phone]

## 2019-05-14 ENCOUNTER — Other Ambulatory Visit: Payer: Self-pay | Admitting: Family Medicine

## 2019-05-14 DIAGNOSIS — E118 Type 2 diabetes mellitus with unspecified complications: Secondary | ICD-10-CM

## 2019-06-02 ENCOUNTER — Ambulatory Visit: Payer: Medicaid Other | Attending: Internal Medicine

## 2019-06-02 DIAGNOSIS — Z23 Encounter for immunization: Secondary | ICD-10-CM

## 2019-06-02 NOTE — Progress Notes (Signed)
   Covid-19 Vaccination Clinic  Name:  Nilesh Stegall    MRN: [id number] DOB: [birth year]  06/02/2019  Mr. Barto was observed post Covid-19 immunization for 15 minutes without incident. He was provided with Vaccine Information Sheet and instruction to access the V-Safe system.   Mr. Gurry was instructed to call 911 with any severe reactions post vaccine: Marland Kitchen Difficulty breathing  . Swelling of face and throat  . A fast heartbeat  . A bad rash all over body  . Dizziness and weakness   Immunizations Administered    Name Date Dose VIS Date Route   Pfizer COVID-19 Vaccine 06/02/2019  9:56 AM 0.3 mL 02/27/2019 Intramuscular   Manufacturer: [employer], [employer]   Lot: [id number]   NDC: [address]-7

## 2019-06-12 ENCOUNTER — Other Ambulatory Visit: Payer: Self-pay | Admitting: Family Medicine

## 2019-06-12 DIAGNOSIS — E118 Type 2 diabetes mellitus with unspecified complications: Secondary | ICD-10-CM

## 2019-06-17 ENCOUNTER — Ambulatory Visit (HOSPITAL_COMMUNITY)
Admission: EM | Admit: 2019-06-17 | Discharge: 2019-06-17 | Disposition: A | Discharge status: Home / Self Care | Payer: Medicaid Other | Attending: Family Medicine | Admitting: Family Medicine

## 2019-06-17 ENCOUNTER — Encounter (HOSPITAL_COMMUNITY): Payer: Self-pay

## 2019-06-17 ENCOUNTER — Other Ambulatory Visit: Payer: Self-pay

## 2019-06-17 DIAGNOSIS — Z794 Long term (current) use of insulin: Secondary | ICD-10-CM | POA: Diagnosis not present

## 2019-06-17 DIAGNOSIS — R7309 Other abnormal glucose: Secondary | ICD-10-CM

## 2019-06-17 DIAGNOSIS — E1169 Type 2 diabetes mellitus with other specified complication: Secondary | ICD-10-CM | POA: Diagnosis not present

## 2019-06-17 DIAGNOSIS — R739 Hyperglycemia, unspecified: Secondary | ICD-10-CM | POA: Diagnosis not present

## 2019-06-17 LAB — GLUCOSE, POCT (MANUAL RESULT ENTRY): POC Glucose: 564 mg/dl — AB (ref 70–99)

## 2019-06-17 LAB — CBG MONITORING, ED
Glucose-Capillary: 584 mg/dL (ref 70–99)
Glucose-Capillary: 584 mg/dL (ref 70–99)

## 2019-06-17 LAB — GLUCOSE, CAPILLARY: Glucose-Capillary: 584 mg/dL (ref 70–99)

## 2019-06-17 NOTE — ED Triage Notes (Signed)
Patient here for increased urinary frequency. He is concerned it has to due with his diabetes. According to nurse who sent him here, FSBS was 564

## 2019-06-17 NOTE — ED Provider Notes (Signed)
[facility]    CSN: [id number] Arrival date & time: 06/17/19  1121      History   Chief Complaint Chief Complaint  Patient presents with  . Hyperglycemia    HPI Randy Fisher is a 63 y.o. male.   HPI  Patient is seeing a Swahili interpreter Beverely Pace He was asked to come in because of a blood sugar at home 560.  He was seen by congregational nurse associated with his primary care office. Patient states that he has had decreased appetite and increased thirst.  Increased urination.  Present since yesterday He states his blood sugars have been running in the 1 60-200 range pretty consistently.  He is compliant with medications.  I confirmed that he is taking Metformin 100 mg twice daily, Januvia 10 mg a day, and Lantus 20 units daily. He states the reason his blood sugars up today is that he forgot to take his Lantus yesterday and today He also has a poor diet. No recent infection or fever.  He has had both of his coronavirus vaccinations. His only other complaint is chronic constipation.  This has been addressed by his family doctor.  Past Medical History:  Diagnosis Date  . Alcohol abuse   . Diabetes mellitus without complication (HCC)   . Hypertension     Patient Active Problem List   Diagnosis Date Noted  . Hyperlipidemia 09/11/2017  . Greater trochanteric bursitis of right hip 06/12/2017  . Left hand pain 09/04/2016  . Left upper extremity numbness 12/21/2015  . Elbow pain 11/30/2015  . Achilles tendinosis 09/19/2015  . Cataract 07/04/2015  . HTN (hypertension) 06/22/2015  . Constipation 03/22/2015  . CHF (congestive heart failure) (HCC) 12/31/2014  . Language barrier 10/11/2014  . T2DM (type 2 diabetes mellitus) (HCC) 08/26/2014  . History of alcohol abuse 08/26/2014  . Knee pain, bilateral 08/26/2014    Past Surgical History:  Procedure Laterality Date  . CATARACT EXTRACTION Right 09/22/15  . TUMOR REMOVAL Left March 2017   upper arm        Home Medications    Prior to Admission medications   Medication Sig Start Date End Date Taking? Authorizing Provider  gabapentin (NEURONTIN) 300 MG capsule TAKE ONE CAPSULE (300 MG TOTAL) BY MOUTH THREE TIMES DAILY 05/14/19   Marthenia Rolling, DO  ACCU-CHEK SOFTCLIX LANCETS lancets use to check blood sugar in the morning before breakfast, 2 hours after lunch and dinner 06/18/16   [name], MD  atorvastatin (LIPITOR) 40 MG tablet TAKE ONE TABLET BY MOUTH DAILY AT [facility] 03/04/19   Marthenia Rolling, DO  glucose blood (ACCU-CHEK AVIVA) test strip Use to check your blood sugar in the morning before breakfast, 2 hours after lunch and dinner 04/19/16   [name], MD  JARDIANCE 10 MG TABS tablet Take 10 mg by mouth daily. 03/31/19   Marthenia Rolling, DO  Lancet Devices (AUTOLET LANCING DEVICE) MISC use to check blood sugar in the morning before breakfast, 2 hours after lunch and dinner 07/13/15   [name], MD  LANTUS 100 UNIT/ML injection INJECT 0.2 MLS (20 UNITS TOTAL) INTO THE SKIN DAILY 03/04/19   Marthenia Rolling, DO  lisinopril (PRINIVIL,ZESTRIL) 5 MG tablet Take 1 tablet (5 mg total) by mouth daily. 04/28/18   Lennox Solders, MD  metFORMIN (GLUCOPHAGE) 1000 MG tablet TAKE ONE TABLET (1000 MG TOTAL) BY MOUTH TWICE DAILY WITH A MEAL 06/12/19   Marthenia Rolling, DO  Misc. Devices (DIGITAL GLASS SCALE) MISC Check  weight daily 07/13/15   Latrelle Dodrill, MD  polyethylene glycol powder (GLYCOLAX/MIRALAX) 17 GM/SCOOP powder Take 17 g by mouth daily. 02/09/19   Marthenia Rolling, DO  Probiotic Product (PROBIOTIC DAILY PO) Take 1 tablet by mouth 2 (two) times daily.    [provider]  SURE COMFORT INSULIN SYRINGE 31G X 5/16" 0.3 ML MISC USE TO inject lantus daily 12/31/18   Marthenia Rolling, DO    Family History Family History  Family history unknown: Yes    Social History Social History   Tobacco Use  . Smoking status: Never Smoker  . Smokeless tobacco: Never Used   Substance Use Topics  . Alcohol use: No    Alcohol/week: 0.0 standard drinks  . Drug use: No     Allergies   Patient has no known allergies.   Review of Systems Review of Systems  Constitutional: Positive for appetite change.  Gastrointestinal: Positive for constipation.  Endocrine: Positive for polydipsia and polyuria.     Physical Exam Triage Vital Signs ED Triage Vitals [06/17/19 1159]  Enc Vitals Group     BP 105/70     Pulse Rate 97     Resp 14     Temp 98.3 F (36.8 C)     Temp Source Oral     SpO2 96 %     Weight      Height      Head Circumference      Peak Flow      Pain Score 0     Pain Loc      Pain Edu?      Excl. in GC?    No data found.  Updated Vital Signs BP 105/70 (BP Location: Right Arm)   Pulse 97   Temp 98.3 F (36.8 C) (Oral)   Resp 14   SpO2 96%      Physical Exam Constitutional:      General: He is not in acute distress.    Appearance: He is well-developed.     Comments: Exam by observation  HENT:     Head: Normocephalic and atraumatic.     Mouth/Throat:     Comments: Mask in place Eyes:     Conjunctiva/sclera: Conjunctivae normal.     Pupils: Pupils are equal, round, and reactive to light.  Cardiovascular:     Rate and Rhythm: Normal rate.  Pulmonary:     Effort: Pulmonary effort is normal. No respiratory distress.  Musculoskeletal:        General: Normal range of motion.     Cervical back: Normal range of motion.  Skin:    General: Skin is warm and dry.  Neurological:     Mental Status: He is alert.  Psychiatric:        Mood and Affect: Mood normal.        Behavior: Behavior normal.      UC Treatments / Results  Labs (all labs ordered are listed, but only abnormal results are displayed) Labs Reviewed  GLUCOSE, CAPILLARY - Abnormal; Notable for the following components:      Result Value   Glucose-Capillary 584 (*)    All other components within normal limits  CBG MONITORING, ED - Abnormal; Notable for  the following components:   Glucose-Capillary 584 (*)    All other components within normal limits  CBG MONITORING, ED - Abnormal; Notable for the following components:   Glucose-Capillary 584 (*)    All other components within normal limits  EKG   Radiology No results found.  Procedures Procedures (including critical care time)  Medications Ordered in UC Medications - No data to display  Initial Impression / Assessment and Plan / UC Course  I have reviewed the triage vital signs and the nursing notes.  Pertinent labs & imaging results that were available during my care of the patient were reviewed by me and considered in my medical decision making (see chart for details).     I discussed with the patient that we would optimally get urine test and blood work to determine if he has electrolyte abnormalities or ketones in addition to his hyperglycemia.  Patient states that he just wants to go home and take his Lantus.  He feels reasonably well except for the increased thirst and urination which are explained by the hyperglycemia.  He does agree to come back if he starts feeling worse instead of better, large lethargy, fever, dehydration We discussed the importance of taking his daily medicines on a regular basis.  I recommend that he set an alarm on his smart phone if he is unable to remember important medication Final Clinical Impressions(s) / UC Diagnoses   Final diagnoses:  Hyperglycemia  Type 2 diabetes mellitus with other specified complication, with long-term current use of insulin [facility])     Discharge Instructions     Your blood sugar is high because you have not taken your insulin Go home and take insulin now Set an alarm to take daily Follow up with your primary care doctor Call them for advice on medicine management of your diabetes    ED Prescriptions    None     PDMP not reviewed this encounter.   Raylene Everts, MD 06/17/19 1352

## 2019-06-17 NOTE — ED Notes (Signed)
Notified provider of the CBG results

## 2019-06-17 NOTE — Discharge Instructions (Addendum)
Your blood sugar is high because you have not taken your insulin Go home and take insulin now Set an alarm to take daily Follow up with your primary care doctor Call them for advice on medicine management of your diabetes

## 2019-06-17 NOTE — Congregational Nurse Program (Signed)
  Dept: [phone]   Congregational Nurse Program Note  Date of Encounter: 06/17/2019  Past Medical History: Past Medical History:  Diagnosis Date  . Alcohol abuse   . Diabetes mellitus without complication (HCC)   . Hypertension     Encounter Details: CNP Questionnaire - 06/17/19 1100      Questionnaire   Patient Status  Refugee    Race  African    Location Patient Served At  [facility]    Insurance  Medicaid    Uninsured  Not Applicable    Food  Yes, have food insecurities    Housing/Utilities  Yes, have permanent housing    Transportation  No transportation needs    Interpersonal Safety  Yes, feel physically and emotionally safe where you currently live    Medication  No medication insecurities    Medical Provider  Yes    Referrals  Not Applicable;Urgent Care    ED Visit Averted  Not Applicable    Life-Saving Intervention Made  Not Applicable     Mr Randy Fisher in to [employer] for english classes and saw congregational nurse for c/o fatigue and poor appettitte.He reports to be taking all his medication as prescribed. Blood sugar done reading high 564. Patient referred to [facility]. Arman Bogus RN BSN PCCN  [phone]

## 2019-07-19 ENCOUNTER — Emergency Department (HOSPITAL_COMMUNITY): Payer: Medicaid Other

## 2019-07-19 ENCOUNTER — Encounter (HOSPITAL_COMMUNITY): Payer: Self-pay

## 2019-07-19 ENCOUNTER — Emergency Department (HOSPITAL_COMMUNITY)
Admission: EM | Admit: 2019-07-19 | Discharge: 2019-07-19 | Disposition: A | Discharge status: Home / Self Care | Payer: Medicaid Other | Attending: Emergency Medicine | Admitting: Emergency Medicine

## 2019-07-19 DIAGNOSIS — I110 Hypertensive heart disease with heart failure: Secondary | ICD-10-CM | POA: Insufficient documentation

## 2019-07-19 DIAGNOSIS — I509 Heart failure, unspecified: Secondary | ICD-10-CM | POA: Diagnosis not present

## 2019-07-19 DIAGNOSIS — R079 Chest pain, unspecified: Secondary | ICD-10-CM | POA: Diagnosis not present

## 2019-07-19 DIAGNOSIS — E119 Type 2 diabetes mellitus without complications: Secondary | ICD-10-CM | POA: Diagnosis not present

## 2019-07-19 DIAGNOSIS — Z79899 Other long term (current) drug therapy: Secondary | ICD-10-CM | POA: Insufficient documentation

## 2019-07-19 DIAGNOSIS — R1084 Generalized abdominal pain: Secondary | ICD-10-CM | POA: Diagnosis not present

## 2019-07-19 DIAGNOSIS — S0990XA Unspecified injury of head, initial encounter: Secondary | ICD-10-CM | POA: Diagnosis not present

## 2019-07-19 DIAGNOSIS — S32010A Wedge compression fracture of first lumbar vertebra, initial encounter for closed fracture: Secondary | ICD-10-CM | POA: Diagnosis not present

## 2019-07-19 DIAGNOSIS — M5489 Other dorsalgia: Secondary | ICD-10-CM | POA: Diagnosis not present

## 2019-07-19 DIAGNOSIS — Y999 Unspecified external cause status: Secondary | ICD-10-CM | POA: Diagnosis not present

## 2019-07-19 DIAGNOSIS — R519 Headache, unspecified: Secondary | ICD-10-CM | POA: Insufficient documentation

## 2019-07-19 DIAGNOSIS — Y939 Activity, unspecified: Secondary | ICD-10-CM | POA: Diagnosis not present

## 2019-07-19 DIAGNOSIS — R0789 Other chest pain: Secondary | ICD-10-CM | POA: Diagnosis not present

## 2019-07-19 DIAGNOSIS — S8992XA Unspecified injury of left lower leg, initial encounter: Secondary | ICD-10-CM | POA: Diagnosis not present

## 2019-07-19 DIAGNOSIS — M545 Low back pain: Secondary | ICD-10-CM | POA: Diagnosis not present

## 2019-07-19 DIAGNOSIS — Z794 Long term (current) use of insulin: Secondary | ICD-10-CM | POA: Insufficient documentation

## 2019-07-19 DIAGNOSIS — Y929 Unspecified place or not applicable: Secondary | ICD-10-CM | POA: Diagnosis not present

## 2019-07-19 DIAGNOSIS — R0602 Shortness of breath: Secondary | ICD-10-CM | POA: Diagnosis not present

## 2019-07-19 DIAGNOSIS — M25562 Pain in left knee: Secondary | ICD-10-CM | POA: Diagnosis not present

## 2019-07-19 DIAGNOSIS — S3992XA Unspecified injury of lower back, initial encounter: Secondary | ICD-10-CM | POA: Diagnosis not present

## 2019-07-19 DIAGNOSIS — M546 Pain in thoracic spine: Secondary | ICD-10-CM | POA: Diagnosis not present

## 2019-07-19 DIAGNOSIS — S299XXA Unspecified injury of thorax, initial encounter: Secondary | ICD-10-CM | POA: Diagnosis not present

## 2019-07-19 MED ORDER — HYDROCODONE-ACETAMINOPHEN 5-325 MG PO TABS: 1.0000 | ORAL_TABLET | Freq: Four times a day (QID) | ORAL | 0 refills | Status: DC | PRN

## 2019-07-19 MED ORDER — HYDROCODONE-ACETAMINOPHEN 5-325 MG PO TABS
1.0000 | ORAL_TABLET | Freq: Once | ORAL | Status: AC
  Administered 2019-07-19: 1 via ORAL
  Filled 2019-07-19: qty 1

## 2019-07-19 NOTE — Progress Notes (Signed)
Orthopedic Tech Progress Note Patient Details:  Randy Fisher 30-Nov-1955 [id number] Fitted patient with TLSO brace. Got him up and walked around with him as well as showed him how to apply and reapply back brace Patient ID: Randy Fisher, male   DOB: 1955-09-11, 64 y.o.   MRN: [id number]   Randy Fisher 07/19/2019, 11:46 AM
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
## 2019-07-19 NOTE — ED Notes (Signed)
Patient transported to X-ray 
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
## 2019-07-20 ENCOUNTER — Other Ambulatory Visit: Payer: Self-pay | Admitting: Family Medicine

## 2019-07-20 DIAGNOSIS — K5900 Constipation, unspecified: Secondary | ICD-10-CM

## 2019-07-27 DIAGNOSIS — M4856XA Collapsed vertebra, not elsewhere classified, lumbar region, initial encounter for fracture: Secondary | ICD-10-CM | POA: Diagnosis not present

## 2019-07-27 DIAGNOSIS — M545 Low back pain: Secondary | ICD-10-CM | POA: Diagnosis not present

## 2019-08-04 LAB — GLUCOSE, POCT (MANUAL RESULT ENTRY): POC Glucose: 279 mg/dl — AB (ref 70–99)
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
## 2019-08-05 ENCOUNTER — Other Ambulatory Visit: Payer: Self-pay

## 2019-08-05 ENCOUNTER — Encounter: Payer: Self-pay | Admitting: *Deleted

## 2019-08-05 DIAGNOSIS — R739 Hyperglycemia, unspecified: Secondary | ICD-10-CM

## 2019-08-05 DIAGNOSIS — R7309 Other abnormal glucose: Secondary | ICD-10-CM

## 2019-08-05 LAB — GLUCOSE, POCT (MANUAL RESULT ENTRY): POC Glucose: 201 mg/dl — AB (ref 70–99)
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
## 2019-08-06 ENCOUNTER — Encounter: Payer: Self-pay | Admitting: *Deleted

## 2019-09-17 ENCOUNTER — Other Ambulatory Visit: Payer: Self-pay | Admitting: Family Medicine

## 2019-10-02 ENCOUNTER — Other Ambulatory Visit: Payer: Self-pay | Admitting: Family Medicine

## 2019-10-02 DIAGNOSIS — E1169 Type 2 diabetes mellitus with other specified complication: Secondary | ICD-10-CM

## 2019-10-06 NOTE — Telephone Encounter (Signed)
Called patient using [employer] ID # [id number].  LVM for patient that he will need to make an appointment for next refill.  Glennie Hawk, CMA

## 2019-12-02 ENCOUNTER — Other Ambulatory Visit: Payer: Self-pay | Admitting: Family Medicine

## 2019-12-02 DIAGNOSIS — E1169 Type 2 diabetes mellitus with other specified complication: Secondary | ICD-10-CM

## 2019-12-03 NOTE — Telephone Encounter (Signed)
Will provide 1 month refill, however please have patient schedule follow up appt as soon as possible to evaluate diabetes

## 2019-12-08 ENCOUNTER — Other Ambulatory Visit: Payer: Self-pay

## 2019-12-08 DIAGNOSIS — R739 Hyperglycemia, unspecified: Secondary | ICD-10-CM

## 2019-12-08 LAB — GLUCOSE, POCT (MANUAL RESULT ENTRY): POC Glucose: 420 mg/dl — AB (ref 70–99)
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
## 2019-12-09 ENCOUNTER — Other Ambulatory Visit: Payer: Self-pay

## 2019-12-09 ENCOUNTER — Ambulatory Visit (INDEPENDENT_AMBULATORY_CARE_PROVIDER_SITE_OTHER): Payer: Medicaid Other | Admitting: Family Medicine

## 2019-12-09 VITALS — BP 92/62 | HR 77 | Wt 159.4 lb

## 2019-12-09 DIAGNOSIS — M545 Low back pain, unspecified: Secondary | ICD-10-CM

## 2019-12-09 DIAGNOSIS — Z794 Long term (current) use of insulin: Secondary | ICD-10-CM | POA: Diagnosis not present

## 2019-12-09 DIAGNOSIS — G8929 Other chronic pain: Secondary | ICD-10-CM | POA: Diagnosis not present

## 2019-12-09 DIAGNOSIS — E118 Type 2 diabetes mellitus with unspecified complications: Secondary | ICD-10-CM | POA: Diagnosis not present

## 2019-12-09 DIAGNOSIS — E1169 Type 2 diabetes mellitus with other specified complication: Secondary | ICD-10-CM | POA: Diagnosis not present

## 2019-12-09 DIAGNOSIS — R350 Frequency of micturition: Secondary | ICD-10-CM

## 2019-12-09 LAB — POCT URINALYSIS DIP (MANUAL ENTRY)
Bilirubin, UA: NEGATIVE
Blood, UA: NEGATIVE
Glucose, UA: >=1000 mg/dL — AB
Ketones, POC UA: NEGATIVE mg/dL
Leukocytes, UA: NEGATIVE
Nitrite, UA: NEGATIVE
Protein Ur, POC: NEGATIVE mg/dL
Spec Grav, UA: 1.015 (ref 1.010–1.025)
Urobilinogen, UA: 0.2 E.U./dL
pH, UA: 7.5 (ref 5.0–8.0)

## 2019-12-09 LAB — POCT GLYCOSYLATED HEMOGLOBIN (HGB A1C): HbA1c POC (<> result, manual entry): >15 % (ref 4.0–5.6)

## 2019-12-09 MED ORDER — BACLOFEN 10 MG PO TABS: 10.0000 mg | ORAL_TABLET | Freq: Three times a day (TID) | ORAL | 0 refills | Status: DC

## 2019-12-09 MED ORDER — INSULIN GLARGINE 100 UNIT/ML ~~LOC~~ SOLN: 40.0000 [IU] | Freq: Every day | SUBCUTANEOUS | 3 refills | Status: DC

## 2019-12-09 MED ORDER — METFORMIN HCL 1000 MG PO TABS: ORAL_TABLET | ORAL | 5 refills | Status: DC
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
## 2019-12-09 NOTE — Patient Instructions (Addendum)
It was wonderful to see you today.  Please bring ALL of your medications with you to every visit.   Today we talked about:  Back pain.  This is likely a chronic issue.  Please try baclofen which is a muscle relaxer.  You can also try lidocaine patches over-the-counter.  We also addressed your diabetes.  Which is currently uncontrolled.  I have restarted you on Metformin.  And I have increased your Lantus to 40 units.  Please follow-up in 1 week with your PCP  We also got blood work if anything is abnormal I will give you a call if normal I will send a letter.  I have also referred you to our chronic care management team they will be giving you a call to help you out with some of your health care needs.  Please be sure to schedule follow up at the front  desk before you leave today.   Please call the clinic at [phone] if your symptoms worsen or you have any concerns. It was our pleasure to serve you.  Dr. Salvadore Dom

## 2019-12-10 ENCOUNTER — Telehealth: Payer: Self-pay

## 2019-12-10 ENCOUNTER — Ambulatory Visit: Payer: Medicaid Other

## 2019-12-10 LAB — CBC
Hematocrit: 45.7 % (ref 37.5–51.0)
Hemoglobin: 15.2 g/dL (ref 13.0–17.7)
MCH: 28.1 pg (ref 26.6–33.0)
MCHC: 33.3 g/dL (ref 31.5–35.7)
MCV: 85 fL (ref 79–97)
Platelets: 222 10*3/uL (ref 150–450)
RBC: 5.4 x10E6/uL (ref 4.14–5.80)
RDW: 13.1 % (ref 11.6–15.4)
WBC: 6.6 10*3/uL (ref 3.4–10.8)

## 2019-12-10 LAB — BASIC METABOLIC PANEL
BUN/Creatinine Ratio: 15 (ref 10–24)
BUN: 13 mg/dL (ref 8–27)
CO2: 27 mmol/L (ref 20–29)
Calcium: 9.2 mg/dL (ref 8.6–10.2)
Chloride: 101 mmol/L (ref 96–106)
Creatinine, Ser: 0.89 mg/dL (ref 0.76–1.27)
GFR calc Af Amer: 103 mL/min/{1.73_m2} (ref >59)
GFR calc non Af Amer: 89 mL/min/{1.73_m2} (ref >59)
Glucose: 229 mg/dL — ABNORMAL HIGH (ref 65–99)
Potassium: 4.5 mmol/L (ref 3.5–5.2)
Sodium: 138 mmol/L (ref 134–144)

## 2019-12-10 NOTE — Chronic Care Management (AMB) (Signed)
   RNCM Care Management Collaboration 12/10/2019 Name: Randy Fisher MRN: [id number] DOB: 13-Dec-1955   Randy Fisher is a 64 y.o. year old male who sees A[place], [place], DO for primary care. RNCM was consulted by PCP to assistance patient with  Disease Management Educational Needs for DM II.     Recommendation: After collaboration with Arman Bogus RN  She agrees that the patient may benefit continued care with her.  She is with the Congregational nurse and speaks swahili and currently involved in his care.   Intervention: Patient was not interviewed or contacted during this encounter.  Review of patient status, including review of consultants reports, relevant laboratory and other test results, and collaboration with appropriate care team members and the patient's provider was performed as part of comprehensive patient evaluation and provision of chronic care management services.     Plan:  1. RNCM will work with Nicole Cella Muhoro RN to help coordinate any appointments needed 2. RNCM gave Nicole Cella the patient's next appointment that he is scheduled for in the office.  12/17/19 at 230.pm.  She will relay this information to the patient. 3. If further intervention or needs are Identified.  Contact RN care Production designer, theatre/television/film to provide interventions.    Juanell Fairly RN, BSN, [facility] Care Management Coordinator [facility] Phone: [phone] Fax: [phone]

## 2019-12-10 NOTE — Telephone Encounter (Signed)
Received a call from Rosana Fret RN with care management at [facility] family [facility].patient was seen today for back pain and elevated blood sugar. MD has made some changes to his insulin and Metformin. I will follow up with this patient to make sure he understands today's instructions. Nicole Cella Keeya Dyckman RN BSn PCCn  [facility] Congregational Nurse [phone]-cell [phone]-office

## 2019-12-15 ENCOUNTER — Encounter: Payer: Self-pay | Admitting: Family Medicine

## 2019-12-15 DIAGNOSIS — M545 Low back pain, unspecified: Secondary | ICD-10-CM | POA: Insufficient documentation
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
## 2019-12-15 NOTE — Assessment & Plan Note (Addendum)
Hbg A1c >15.0. Uncontrolled. Patient may not be taking all of his medication and is not tracking CBGs. He would likely benefit from CCM referral as this continues to be a longstanding problem of uncontrolled diabetes. Sx of polyuria and neuropathy related to his elevated A1c.  -UA -Refilled metformin and lantus (increased to 40 units) -BMP -Referral to CCM -Follow up with PCP in 1 week

## 2019-12-16 DIAGNOSIS — R7309 Other abnormal glucose: Secondary | ICD-10-CM

## 2019-12-16 LAB — GLUCOSE, POCT (MANUAL RESULT ENTRY): POC Glucose: 328 mg/dl — AB (ref 70–99)
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
## 2019-12-17 ENCOUNTER — Other Ambulatory Visit: Payer: Self-pay

## 2019-12-17 ENCOUNTER — Ambulatory Visit (INDEPENDENT_AMBULATORY_CARE_PROVIDER_SITE_OTHER): Payer: Medicaid Other | Admitting: Family Medicine

## 2019-12-17 ENCOUNTER — Encounter: Payer: Self-pay | Admitting: Family Medicine

## 2019-12-17 VITALS — BP 110/66 | HR 71 | Wt 164.1 lb

## 2019-12-17 DIAGNOSIS — Z794 Long term (current) use of insulin: Secondary | ICD-10-CM | POA: Diagnosis not present

## 2019-12-17 DIAGNOSIS — M47816 Spondylosis without myelopathy or radiculopathy, lumbar region: Secondary | ICD-10-CM | POA: Diagnosis not present

## 2019-12-17 DIAGNOSIS — E1169 Type 2 diabetes mellitus with other specified complication: Secondary | ICD-10-CM

## 2019-12-17 MED ORDER — TRAMADOL HCL 50 MG PO TABS: 50.0000 mg | ORAL_TABLET | Freq: Every day | ORAL | 0 refills | Status: AC | PRN
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
## 2019-12-17 NOTE — Patient Instructions (Addendum)
Unaelekezwa kwa [place] ya [place]. Tafuta nambari za kukupigia ambazo unaweza usitambue.  Umeagizwa tramadol kwa maumivu. Tafadhali chukua kibao 1 tu kila siku kusaidia kupunguza maumivu yako.  Tafadhali fuatilia [place] miezi 2 kwa uchunguzi wa kisukari.  You are being referred to Physical therapy. Look out for numbers to call you that you may not recognize.  You have been prescribed tramadol for pain. Please only take 1 tablet daily to help reduce your pain.   Plpease follow up in 2 months for diabetes check up.   Peggyann Shoals, DO [facility], PGY-3 12/17/2019 2:55 PM

## 2019-12-18 ENCOUNTER — Other Ambulatory Visit: Payer: Self-pay

## 2019-12-18 NOTE — Assessment & Plan Note (Signed)
Patient prescribed Metformin 1000 mg twice daily, Jardiance 10 mg, and Lantus 40 units every evening.  Last 2 HbA1c in 2021 have been greater than 15%.  Patient reports medication compliance since his recent appointment, reports his sugars are better controlled being 96-150 every morning. -Did not make any changes to patient's medications today -Encouraged to continue good compliance with these medications -Follow-up in 2 months for repeat HbA1c or sooner as needed
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
## 2019-12-30 ENCOUNTER — Other Ambulatory Visit: Payer: Self-pay

## 2019-12-30 DIAGNOSIS — R7309 Other abnormal glucose: Secondary | ICD-10-CM

## 2019-12-30 LAB — GLUCOSE, POCT (MANUAL RESULT ENTRY): POC Glucose: 300 mg/dl — AB (ref 70–99)

## 2019-12-30 NOTE — Congregational Nurse Program (Signed)
  Dept: [phone]   Congregational Nurse Program Note  Date of Encounter: 12/30/2019  Past Medical History: Past Medical History:  Diagnosis Date  . Alcohol abuse   . Diabetes mellitus without complication (HCC)   . Hypertension     Encounter Details:  Client came in with his medication. Educated him on how to take all his medications.Blood sugar 300 today.Advised on medication and diet compliance. Dariela Stoker RN BSN PCCn \ [facility]. [phone]-office [phone]-cell

## 2020-01-05 ENCOUNTER — Telehealth: Payer: Self-pay

## 2020-01-05 NOTE — Telephone Encounter (Signed)
Called and attempted to reach patient. No answer, LVM for patient with appointments using [employer] interpreter [place] id# [phone].  [name] 01/14/2020 @ 330 pm  Dr. Mauri Reading 02/03/2020 @ 1030 am  Appointments made per Dr. Manson Passey.  Glennie Hawk, CMA

## 2020-01-14 ENCOUNTER — Ambulatory Visit: Payer: Medicaid Other | Admitting: Pharmacist

## 2020-01-27 NOTE — Congregational Nurse Program (Signed)
Randy Fisher came in for blood sugar check. Same done. I have assisted him to read immigration letters as well and reminded him of next appointment on November 17th. [name] Rn BSn PCCn  [facility] [phone]-cell [phone]-office

## 2020-02-02 NOTE — Progress Notes (Signed)
 Subjective:   Patient ID: Randy Fisher    DOB: 12/07/1955, 64 y.o. male   MRN: [id number]  Randy Fisher is a 64 y.o. male with a history of CHF, HTN, T2DM, lumbar spondylosis, cataracts, chronic left-sided back pain, constipation, h/o alcohol abuse, HLD, language barrier  here for follow up.  Diabetes: Last three A1C's below. Currently on Lantus 40U QD, Jardiance 10mg QD, Metformin 1000mg BID. Endorses compliance. Has not been checking blood sugars because he lost his glucometer. Denies any hypoglycemia. Denies any polyuria, polydipsia, polyphagia. Due for diabetic eye exam, foot exam, and PNA vaccine  Lab Results  Component Value Date   HGBA1C >15.0 12/09/2019   HGBA1C >15.0 04/28/2018   HGBA1C 8.8 (A) 09/11/2017    HTN:  BP: 110/72 today. Supposed to be on Lisinopril 5mg QD but this is not in his medication bag. He is not taking this. Non-smoker. Denies any chest pain, SOB, vision changes, or headaches.    HLD: Last lipid panel below. Currently on Atorvastatin 40mg QD. Endorses compliance. Denies any muscles aches or weakness. The ASCVD Risk score (Goff DC Jr., et al., 2013) failed to calculate for the following reasons:   The valid total cholesterol range is 130 to 320 mg/dL   Lab Results  Component Value Date   CHOL 126 06/05/2018   HDL 46 06/05/2018   LDLCALC 59 06/05/2018   TRIG 105 06/05/2018   CHOLHDL 2.7 06/05/2018   Review of Systems:  Per HPI.   Objective:   BP 110/72   Pulse 74   Ht 5' 7" (1.702 m)   Wt 165 lb 9.6 oz (75.1 kg)   SpO2 98%   BMI 25.94 kg/m  Vitals and nursing note reviewed.  General: pleasant older gentleman, sitting comfortably in exam chair, well nourished, well developed, in no acute distress with non-toxic appearance Resp: breathing comfortably on room air, speaking in full sentences MSK:  gait normal Neuro: Alert and oriented, speech normal  Assessment & Plan:   HTN (hypertension) Chronic. Controlled. Not currently on  antihypertensives. Has not been taking Lisinopril.  - start low dose Lisinopril 2.5mg for kidney protection - Recheck BMP in 2 weeks to monitor (lab order placed and visit scheduled)  T2DM (type 2 diabetes mellitus) (HCC) Chronic, uncontrolled. Endorses compliance with Lantus, Jardiance, Metformin. Has not been checking blood sugars as he lost his glucometer.  Reorder Glucometer Check blood sugars TID and keep a log Follow up in 1 month for A1C and diabetes follow up Plan for diabetic foot exam and eye exam at follow up visit  Hyperlipidemia Chronic. Endorses compliance with Statin. Nonsmoker. - lipid panel to be obtained at lab visit  Orders Placed This Encounter  Procedures  . Lipid Panel    Standing Status:   Future    Standing Expiration Date:   02/02/2021  . Basic Metabolic Panel    Standing Status:   Future    Standing Expiration Date:   02/02/2021   Meds ordered this encounter  Medications  . lisinopril (ZESTRIL) 2.5 MG tablet    Sig: Take 1 tablet (2.5 mg total) by mouth at bedtime.    Dispense:  90 tablet    Refill:  3  . Blood Glucose Monitoring Suppl (ACCU-CHEK GUIDE) w/Device KIT    Sig: 1 kit by Does not apply route 3 (three) times daily.    Dispense:  1 kit    Refill:  1  . Lancets Misc. (ACCU-CHEK SOFTCLIX LANCET DEV) KIT      Sig: 1 Device by Does not apply route in the morning, at noon, and at bedtime.    Dispense:  1 kit    Refill:  1  . glucose blood (ACCU-CHEK AVIVA) test strip    Sig: Use to check your blood sugar in the morning before breakfast, 2 hours after lunch and dinner    Dispense:  100 each    Refill:  12  . Accu-Chek Softclix Lancets lancets    Sig: use to check blood sugar in the morning before breakfast, 2 hours after lunch and dinner    Dispense:  100 each    Refill:  3    This prescription was filled on 06/18/2016. Any refills authorized will be placed on file.   Due to language barrier, an interpreter was present during the  history-taking and subsequent discussion (and for part of the physical exam) with this patient. Swahili #[id number]    Randy Marble, DO PGY-3, [facility] 02/03/2020 12:54 PM

## 2020-02-03 ENCOUNTER — Other Ambulatory Visit: Payer: Self-pay

## 2020-02-03 ENCOUNTER — Ambulatory Visit (INDEPENDENT_AMBULATORY_CARE_PROVIDER_SITE_OTHER): Payer: Medicare Other | Admitting: Family Medicine

## 2020-02-03 ENCOUNTER — Encounter: Payer: Self-pay | Admitting: Family Medicine

## 2020-02-03 VITALS — BP 110/72 | HR 74 | Ht 67.0 in | Wt 165.6 lb

## 2020-02-03 DIAGNOSIS — Z794 Long term (current) use of insulin: Secondary | ICD-10-CM | POA: Diagnosis not present

## 2020-02-03 DIAGNOSIS — E785 Hyperlipidemia, unspecified: Secondary | ICD-10-CM

## 2020-02-03 DIAGNOSIS — E1169 Type 2 diabetes mellitus with other specified complication: Secondary | ICD-10-CM

## 2020-02-03 DIAGNOSIS — I10 Essential (primary) hypertension: Secondary | ICD-10-CM | POA: Diagnosis not present

## 2020-02-03 DIAGNOSIS — Z23 Encounter for immunization: Secondary | ICD-10-CM

## 2020-02-03 MED ORDER — ACCU-CHEK SOFTCLIX LANCET DEV KIT: 1.0000 | PACK | Freq: Three times a day (TID) | 1 refills | Status: DC

## 2020-02-03 MED ORDER — ACCU-CHEK SOFTCLIX LANCETS MISC: 3 refills | Status: DC

## 2020-02-03 MED ORDER — ACCU-CHEK GUIDE W/DEVICE KIT: 1.0000 | PACK | Freq: Three times a day (TID) | 1 refills | Status: DC

## 2020-02-03 MED ORDER — LISINOPRIL 2.5 MG PO TABS: 2.5000 mg | ORAL_TABLET | Freq: Every day | ORAL | 3 refills | Status: DC

## 2020-02-03 MED ORDER — GLUCOSE BLOOD VI STRP: ORAL_STRIP | 12 refills | Status: DC

## 2020-02-03 NOTE — Assessment & Plan Note (Addendum)
Chronic. Endorses compliance with Statin. Nonsmoker. - lipid panel to be obtained at lab visit

## 2020-02-03 NOTE — Assessment & Plan Note (Addendum)
Chronic. Controlled. Not currently on antihypertensives. Has not been taking Lisinopril.  - start low dose Lisinopril 2.5mg  for kidney protection - Recheck BMP in 2 weeks to monitor (lab order placed and visit scheduled)

## 2020-02-03 NOTE — Assessment & Plan Note (Addendum)
Chronic, uncontrolled. Endorses compliance with Lantus, Jardiance, Metformin. Has not been checking blood sugars as he lost his glucometer.  Reorder Glucometer Check blood sugars TID and keep a log Follow up in 1 month for A1C and diabetes follow up Plan for diabetic foot exam and eye exam at follow up visit

## 2020-02-03 NOTE — Patient Instructions (Addendum)
Randy Fisher ni furaha [place] leo!  Asante kwa kuchagua D[place] ya Familia ya [facility] kwa huduma yako ya msingi. Randy Fisher alionekana kuwa na kisukari  Mipango yetu ya leo ilikuwa: Kisukari: endelea na vipimo vyako vya Lantus 40, Jardiance 10mg  kila siku, na Metformin 1000mg  mara mbili kwa siku Cholesterol: endelea Atorvastatin yako 40mg  kila siku [place] dawa mpya iitwayo Lisinopril 2.5mg : kibao kimoja usiku kabla ya kulala.  Tafadhali fuatilia kama ilivyopangwa.  Kila la heri,  Waka, DO  It was a pleasure to see you today!  Thank you for choosing [facility] for your primary care.  Randy Fisher was seen for diabetes  Our plans for today were:  Diabetes: continue your Lantus 40 units, Jardiance 10mg  daily, and Metformin 1000mg  twice a day  Cholesterol: continue your Atorvastatin 40mg  daily  I am starting you on a new medicine called Lisinopril 2.5mg : take one tablet at night before bed.  Please follow up as scheduled.   Best Wishes,   , DO

## 2020-02-06 ENCOUNTER — Other Ambulatory Visit: Payer: Self-pay

## 2020-02-06 ENCOUNTER — Ambulatory Visit (INDEPENDENT_AMBULATORY_CARE_PROVIDER_SITE_OTHER): Payer: Medicaid Other | Admitting: Family Medicine

## 2020-02-06 DIAGNOSIS — Z87828 Personal history of other (healed) physical injury and trauma: Secondary | ICD-10-CM

## 2020-02-06 NOTE — Progress Notes (Signed)
Patient was seen and examined for N-648.  °Yitzhak Awan, MD  °Family Medicine Teaching Service  ° °

## 2020-02-16 DIAGNOSIS — R7309 Other abnormal glucose: Secondary | ICD-10-CM

## 2020-02-16 LAB — GLUCOSE, POCT (MANUAL RESULT ENTRY): POC Glucose: 74 mg/dl (ref 70–99)

## 2020-02-16 NOTE — Congregational Nurse Program (Signed)
  Dept: [phone]   Congregational Nurse Program Note  Date of Encounter: 02/16/2020  Past Medical History: Past Medical History:  Diagnosis Date  . Alcohol abuse   . Diabetes mellitus without complication (HCC)   . Hypertension     Encounter Details:  Client reports that his blood sugar was 77 at home. I have rechecked using my glucometer= 74. He has not had anything to eat yet. I have educated him to make sure he takes his medication with meals.  I have also educated with teach back- how to use his new CBG meter. Arman Bogus RN BSN PCCN  [facility] [phone]-cell [phone]-office

## 2020-02-17 ENCOUNTER — Other Ambulatory Visit: Payer: Medicaid Other

## 2020-02-23 ENCOUNTER — Telehealth: Payer: Self-pay

## 2020-02-23 NOTE — Telephone Encounter (Signed)
Client came in requesting assistance to call [employer] billing to pay some medical bills. Same done. Nicole Cella Nili Honda RN BSN PCCn  [phone]-office [phone]-cell

## 2020-02-29 ENCOUNTER — Other Ambulatory Visit: Payer: Self-pay | Admitting: *Deleted

## 2020-02-29 DIAGNOSIS — K5900 Constipation, unspecified: Secondary | ICD-10-CM

## 2020-02-29 DIAGNOSIS — E785 Hyperlipidemia, unspecified: Secondary | ICD-10-CM

## 2020-02-29 MED ORDER — POLYETHYLENE GLYCOL 3350 17 GM/SCOOP PO POWD: ORAL | 6 refills | Status: DC

## 2020-02-29 MED ORDER — ATORVASTATIN CALCIUM 40 MG PO TABS: ORAL_TABLET | ORAL | 3 refills | Status: DC

## 2020-03-07 ENCOUNTER — Encounter: Payer: Self-pay | Admitting: Family Medicine

## 2020-03-07 ENCOUNTER — Other Ambulatory Visit: Payer: Self-pay | Admitting: Family Medicine

## 2020-03-07 ENCOUNTER — Other Ambulatory Visit: Payer: Self-pay

## 2020-03-07 ENCOUNTER — Ambulatory Visit (INDEPENDENT_AMBULATORY_CARE_PROVIDER_SITE_OTHER): Payer: Medicare Other | Admitting: Family Medicine

## 2020-03-07 VITALS — BP 123/80 | HR 77 | Wt 169.0 lb

## 2020-03-07 DIAGNOSIS — M545 Low back pain, unspecified: Secondary | ICD-10-CM

## 2020-03-07 DIAGNOSIS — E118 Type 2 diabetes mellitus with unspecified complications: Secondary | ICD-10-CM

## 2020-03-07 DIAGNOSIS — S0990XD Unspecified injury of head, subsequent encounter: Secondary | ICD-10-CM

## 2020-03-07 DIAGNOSIS — G8929 Other chronic pain: Secondary | ICD-10-CM

## 2020-03-07 MED ORDER — BACLOFEN 10 MG PO TABS: 10.0000 mg | ORAL_TABLET | Freq: Three times a day (TID) | ORAL | 0 refills | Status: DC | PRN

## 2020-03-07 MED ORDER — ACETAMINOPHEN ER 650 MG PO TBCR: 650.0000 mg | EXTENDED_RELEASE_TABLET | Freq: Three times a day (TID) | ORAL | 1 refills | Status: DC | PRN
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
## 2020-03-07 NOTE — Patient Instructions (Addendum)
[name] ni furaha [place] leo!  Asante kwa kuchagua Dawa ya Familia ya [facility] kwa huduma yako ya msingi.  Mipango yetu ya leo ilikuwa: Tafadhali chukua Baclofen hadi mara tatu kwa siku kama inahitajika kwa maumivu Tumia Tylenol 650mg : chukua kibao 1 kila masaa 8 kwa maumivu Tumia pedi ya kupokanzwa Tumia gel ya voltaren, Icey-moto kwa maumivu ya mgongo wako [place] scan ya kichwa chako kutafuta kitu chochote kisicho cha kawaida Tafadhali fuatilia mnamo Januari ili kutathmini zaidi maumivu yako  Tafadhali panga ratiba ya kutembelewa mapema zaidi kwa ajili ya [place] wa kisukari [place] kwa ufuatiliaji wako wa kisukari Tafadhali lete kipimo chako cha [place] damu na dawa   Best Wishes,   Heron Nay, DO   It was a pleasure to see you today!  Thank you for choosing [facility] for your primary care.   Our plans for today were:  Please take Baclofen up to three times a day as needed for pain  Use Tylenol 650mg : take 1 tablet every 8 hours for pain  Use a heating pad  Use voltaren gel, Icey-hot to the pain in your back  I am getting a scan of your head to look for anything abnormal  Please follow up in January to further evaluate your pain  Please schedule a visit at earliest convenience for your diabetes   I will see you tomorrow for your diabetes follow up  Please bring your blood sugar meter and medications
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
## 2020-03-08 ENCOUNTER — Encounter: Payer: Self-pay | Admitting: Family Medicine

## 2020-03-08 ENCOUNTER — Other Ambulatory Visit: Payer: Self-pay

## 2020-03-08 ENCOUNTER — Ambulatory Visit (INDEPENDENT_AMBULATORY_CARE_PROVIDER_SITE_OTHER): Payer: Medicaid Other | Admitting: Family Medicine

## 2020-03-08 VITALS — BP 110/72 | HR 65 | Ht 67.0 in | Wt 166.4 lb

## 2020-03-08 DIAGNOSIS — E782 Mixed hyperlipidemia: Secondary | ICD-10-CM

## 2020-03-08 DIAGNOSIS — E1169 Type 2 diabetes mellitus with other specified complication: Secondary | ICD-10-CM

## 2020-03-08 DIAGNOSIS — F1011 Alcohol abuse, in remission: Secondary | ICD-10-CM | POA: Diagnosis not present

## 2020-03-08 DIAGNOSIS — Z794 Long term (current) use of insulin: Secondary | ICD-10-CM

## 2020-03-08 DIAGNOSIS — I10 Essential (primary) hypertension: Secondary | ICD-10-CM

## 2020-03-08 DIAGNOSIS — Z23 Encounter for immunization: Secondary | ICD-10-CM

## 2020-03-08 DIAGNOSIS — M545 Low back pain, unspecified: Secondary | ICD-10-CM

## 2020-03-08 DIAGNOSIS — G8929 Other chronic pain: Secondary | ICD-10-CM

## 2020-03-08 LAB — POCT GLYCOSYLATED HEMOGLOBIN (HGB A1C): Hemoglobin A1C: 9.4 % — AB (ref 4.0–5.6)

## 2020-03-08 MED ORDER — EMPAGLIFLOZIN 25 MG PO TABS: 25.0000 mg | ORAL_TABLET | Freq: Every day | ORAL | 3 refills | Status: DC

## 2020-03-08 MED ORDER — "INSULIN SYRINGE-NEEDLE U-100 31G X 5/16"" 0.5 ML MISC": 1.0000 | Freq: Every day | 3 refills | Status: DC

## 2020-03-08 NOTE — Assessment & Plan Note (Signed)
Patient has started Baclofen. Notes significant improvement in pain. 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  Continue Tylenol PRN Follow up as needed.

## 2020-03-08 NOTE — Patient Instructions (Addendum)
[name] ni furaha [place] leo!  Asante kwa kuch[place] [facility] kwa huduma yako ya msingi.  Mipango yetu ya leo ilikuwa: Ninaongeza Jardiance yako hadi 25mg  kila siku. Tafadhali malizia chupa yako ya sasa ya Jardiance kwa kumeza vidonge 2 kila siku. Nimetuma agizo jipya. Mara tu unapomaliza chupa nyingine unaweza kuanza chupa mpya na kunywa kibao 1 kwa siku. Endelea na insulini. [place] vitengo 40 kwa siku. Angalia sukari yako ya damu kila asubuhi. Nipigie ikiwa sukari yako ya damu iko chini ya 70 kila wakati ili tuweze kurekebisha insulini yako. Endelea na Metformin yako mara mbili kwa siku. Tafadhali fuatana nami ndani ya miezi 3 kwa ufuatiliaji wa kisukari (Machi 2022)   Kila la heri,  [name], DO  It was a pleasure to see you today!  Thank you for choosing [facility] for your primary care.   Our plans for today were:  I am increasing your Jardiance to 25mg  daily. Please finish your current bottle of Jardiance by taking 2 tablets daily. I have sent in a new prescription. Once you finish the other bottle you can start the new bottle and take 1 tablet a day.  Continue you insulin. Take 40 units daily. Check your blood sugars every morning.  Call me if your blood sugars are consistently below 70 so we can adjust your insulin.  Continue your Metformin twice a day.  Please follow up with me in 3 months for diabetes follow up (March 2022)   Best Wishes,   , DO

## 2020-03-08 NOTE — Assessment & Plan Note (Addendum)
Chronic, improving. A1C down from >15 to 9.4 today. Compliant with medications without any hypoglycemia. - Increase Jardiance to 25mg  QD - Continue Lantus 40U QD - monitor blood sugars - rx for new syringes provided today - Continue Metformin 1000mg  BID - due to for diabetic eye exam. Will discuss at next visit.  - if additional agent is needed can consider adding GLP-1 - Due for PNA-23. Given he received Flu and COVID booster today, will discuss PNA vaccine at follow up visit in 3 months - recheck BMP at follow up visit to monitor kidney function

## 2020-03-08 NOTE — Assessment & Plan Note (Signed)
Chronic. Compliant with statin - Continue Atorvastatin 40mg  QD - plan to recheck lipid panel at follow up visit in 3 months

## 2020-03-08 NOTE — Assessment & Plan Note (Signed)
Chronic, well controlled. On statin. Nonsmoker.  Continue Lisinopril 2.5mg  for kidney protection.

## 2020-03-14 ENCOUNTER — Other Ambulatory Visit: Payer: Self-pay | Admitting: Family Medicine

## 2020-03-14 DIAGNOSIS — I10 Essential (primary) hypertension: Secondary | ICD-10-CM

## 2020-03-14 DIAGNOSIS — Z794 Long term (current) use of insulin: Secondary | ICD-10-CM

## 2020-03-14 NOTE — Progress Notes (Signed)
Using [place] interpreter. Patient scheduled for office visit to discuss back pain and will obtain BMP at that time to monitor kidney function after starting ACE-I and increasing in SGLT-2. Office visit scheduled for 03/22/19 at 9:30am.

## 2020-03-21 ENCOUNTER — Other Ambulatory Visit: Payer: Medicaid Other

## 2020-03-21 ENCOUNTER — Other Ambulatory Visit: Payer: Self-pay

## 2020-03-21 ENCOUNTER — Encounter: Payer: Self-pay | Admitting: Family Medicine

## 2020-03-21 ENCOUNTER — Ambulatory Visit (INDEPENDENT_AMBULATORY_CARE_PROVIDER_SITE_OTHER): Payer: Medicare Other | Admitting: Family Medicine

## 2020-03-21 VITALS — BP 143/80 | HR 101 | Ht 67.0 in | Wt 170.4 lb

## 2020-03-21 DIAGNOSIS — M47816 Spondylosis without myelopathy or radiculopathy, lumbar region: Secondary | ICD-10-CM | POA: Diagnosis not present

## 2020-03-21 DIAGNOSIS — I10 Essential (primary) hypertension: Secondary | ICD-10-CM | POA: Diagnosis not present

## 2020-03-21 DIAGNOSIS — Z794 Long term (current) use of insulin: Secondary | ICD-10-CM

## 2020-03-21 DIAGNOSIS — M545 Low back pain, unspecified: Secondary | ICD-10-CM | POA: Diagnosis present

## 2020-03-21 DIAGNOSIS — E1169 Type 2 diabetes mellitus with other specified complication: Secondary | ICD-10-CM | POA: Diagnosis not present

## 2020-03-21 DIAGNOSIS — G8929 Other chronic pain: Secondary | ICD-10-CM

## 2020-03-21 DIAGNOSIS — E118 Type 2 diabetes mellitus with unspecified complications: Secondary | ICD-10-CM | POA: Diagnosis not present

## 2020-03-21 MED ORDER — BACLOFEN 10 MG PO TABS: 10.0000 mg | ORAL_TABLET | Freq: Three times a day (TID) | ORAL | 0 refills | Status: DC | PRN

## 2020-03-21 MED ORDER — ACETAMINOPHEN ER 650 MG PO TBCR
650.0000 mg | EXTENDED_RELEASE_TABLET | Freq: Three times a day (TID) | ORAL | 1 refills | Status: DC | PRN
Start: 2020-03-21 — End: 2021-07-06

## 2020-03-21 NOTE — Patient Instructions (Signed)
[name] ni furaha [place] leo!  Asante kwa kuch[place] [facility] kwa huduma yako ya msingi.  Mipango yetu ya leo ilikuwa: Tafadhali chukua tylenol 650mg  kila masaa 8 kwa maumivu. [place] Baclofen kama inahitajika kwa maumivu Tafadhali nenda kituo cha Picha cha [facility] kwa eksirei Nimeweka rufaa kwa ortho kwa tathmini zaidi. Tafadhali tarajia simu kutoka kwao kwa tathmini zaidi.  Kila la heri,  Sadler, DO   It was a pleasure to see you today!  Thank you for choosing [facility] for your primary care.   Our plans for today were:  Please take tylenol 650mg  every 8 hours for pain.  Take Baclofen as needed for pain  Please go to the [facility] for the x-rays  I have placed a referral to ortho for further evaluation. Please expect a call from them for further evaluation.  Best Wishes,   , DO
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
## 2020-03-21 NOTE — Assessment & Plan Note (Signed)
Follow up BMP to monitor kidney function after restarting ACE-I

## 2020-03-21 NOTE — Assessment & Plan Note (Signed)
BMP today to ensure stable kidney function after starting SGLT-2

## 2020-03-22 ENCOUNTER — Other Ambulatory Visit: Payer: Self-pay | Admitting: Family Medicine

## 2020-03-22 ENCOUNTER — Ambulatory Visit
Admission: RE | Admit: 2020-03-22 | Discharge: 2020-03-22 | Disposition: A | Discharge status: Home / Self Care | Payer: Medicaid Other | Source: Ambulatory Visit | Attending: Family Medicine | Admitting: Family Medicine

## 2020-03-22 DIAGNOSIS — G8929 Other chronic pain: Secondary | ICD-10-CM

## 2020-03-22 DIAGNOSIS — M545 Low back pain, unspecified: Secondary | ICD-10-CM

## 2020-03-22 DIAGNOSIS — M47816 Spondylosis without myelopathy or radiculopathy, lumbar region: Secondary | ICD-10-CM

## 2020-03-22 LAB — BASIC METABOLIC PANEL
BUN/Creatinine Ratio: 16 (ref 10–24)
BUN: 16 mg/dL (ref 8–27)
CO2: 21 mmol/L (ref 20–29)
Calcium: 9 mg/dL (ref 8.6–10.2)
Chloride: 104 mmol/L (ref 96–106)
Creatinine, Ser: 0.98 mg/dL (ref 0.76–1.27)
GFR calc Af Amer: 92 mL/min/{1.73_m2} (ref >59)
GFR calc non Af Amer: 79 mL/min/{1.73_m2} (ref >59)
Glucose: 451 mg/dL — ABNORMAL HIGH (ref 65–99)
Potassium: 4.3 mmol/L (ref 3.5–5.2)
Sodium: 139 mmol/L (ref 134–144)

## 2020-03-29 ENCOUNTER — Encounter: Payer: Self-pay | Admitting: Family Medicine

## 2020-03-30 ENCOUNTER — Other Ambulatory Visit: Payer: Self-pay | Admitting: *Deleted

## 2020-03-30 ENCOUNTER — Telehealth: Payer: Self-pay

## 2020-03-30 DIAGNOSIS — E118 Type 2 diabetes mellitus with unspecified complications: Secondary | ICD-10-CM

## 2020-03-30 MED ORDER — GABAPENTIN 300 MG PO CAPS: ORAL_CAPSULE | ORAL | 3 refills | Status: DC

## 2020-03-30 NOTE — Telephone Encounter (Signed)
Called patient using [employer] ID# [phone].  Informed patient that CT Scan is:   CT Head w/o contrast [facility] 04/08/2020 Appointment: 1500 Arrival:1445  Patient states that he" is not happy" but will go to appointment.  Reiterated to patient that if he does not go to appointment and continues to have problems, the doctor can not assist him in a diagnosis.  Patient reluctantly agrees to go.  Glennie Hawk, CMA

## 2020-04-08 ENCOUNTER — Ambulatory Visit (HOSPITAL_COMMUNITY): Payer: Medicaid Other

## 2020-04-12 ENCOUNTER — Ambulatory Visit: Payer: Medicaid Other | Admitting: Orthopaedic Surgery

## 2020-04-13 DIAGNOSIS — R7309 Other abnormal glucose: Secondary | ICD-10-CM

## 2020-04-13 LAB — GLUCOSE, POCT (MANUAL RESULT ENTRY): POC Glucose: 65 mg/dl — AB (ref 70–99)

## 2020-04-13 NOTE — Congregational Nurse Program (Signed)
  Dept: [phone]   Congregational Nurse Program Note  Date of Encounter: 04/13/2020  Past Medical History: Past Medical History:  Diagnosis Date  . Alcohol abuse   . Diabetes mellitus without complication (HCC)   . Hypertension     Encounter Details:  Patient is requesting assistance to reschedule appointment with orthopedic. He missed appointment due to transportation difficulties. He took a bus but went to the wrong location.I will be assisting with transportation from today.Appointment scheduled to 04/20/20 at 0900am  Blood sugar today at my office = 65. He did not have breakfast. Orange juice given. Advised not to skip meals.   Arman Bogus RN BSn PCCN  [facility] [phone]-cell [phone]-office

## 2020-04-15 ENCOUNTER — Ambulatory Visit (HOSPITAL_COMMUNITY): Admission: RE | Admit: 2020-04-15 | Payer: Medicaid Other | Source: Ambulatory Visit

## 2020-04-19 ENCOUNTER — Telehealth: Payer: Self-pay

## 2020-04-19 NOTE — Telephone Encounter (Signed)
Contacted patient to remind him of appointment tomorrow and that a transportation assistance will be provided by [employer].  Arman Bogus RN BSn PCCN  [facility] [phone]-cell [phone]-office

## 2020-04-20 ENCOUNTER — Telehealth: Payer: Self-pay

## 2020-04-20 NOTE — Telephone Encounter (Signed)
Patient assisted with transportation to and from [employer] [facility] [place]. Unfortunately patient was not on the schedule. Rescheduled to 2.22.22 at 0900 am.   Arman Bogus RN BSn [facility]  [facility] [phone]-cell [phone]-office

## 2020-04-27 DIAGNOSIS — R739 Hyperglycemia, unspecified: Secondary | ICD-10-CM

## 2020-04-27 LAB — GLUCOSE, POCT (MANUAL RESULT ENTRY): POC Glucose: 162 mg/dl — AB (ref 70–99)

## 2020-04-27 NOTE — Congregational Nurse Program (Signed)
  Dept: [phone]   Congregational Nurse Program Note  Date of Encounter: 04/27/2020  Past Medical History: Past Medical History:  Diagnosis Date  . Alcohol abuse   . Diabetes mellitus without complication (HCC)   . Hypertension     Encounter Details: Patient came in with questions about medical bill. Assisted with paying bill. Provided blood pressure and blood sugar check. Reported to be compliant with medication including insulin. Awaiting appointment 05/02/20 with orthopedics.

## 2020-05-04 DIAGNOSIS — Z794 Long term (current) use of insulin: Secondary | ICD-10-CM

## 2020-05-04 LAB — GLUCOSE, POCT (MANUAL RESULT ENTRY): POC Glucose: 144 mg/dl — AB (ref 70–99)

## 2020-05-04 NOTE — Congregational Nurse Program (Signed)
Patient came in today to check blood pressure and blood sugar. BP was elevated today at 141/82. Nonfasting blood sugar this morning was 144. He reports compliance with is insulin and his oral medications. We will plan to have patient bring his medications in next week to ensure that he is taking them correctly.    Arman Bogus RN BSn PCCN  [facility] [phone]-cell [phone]-office

## 2020-05-10 ENCOUNTER — Encounter: Payer: Self-pay | Admitting: Orthopaedic Surgery

## 2020-05-10 ENCOUNTER — Ambulatory Visit (INDEPENDENT_AMBULATORY_CARE_PROVIDER_SITE_OTHER): Payer: Medicaid Other | Admitting: Orthopaedic Surgery

## 2020-05-10 DIAGNOSIS — S32010A Wedge compression fracture of first lumbar vertebra, initial encounter for closed fracture: Secondary | ICD-10-CM | POA: Diagnosis not present

## 2020-05-10 DIAGNOSIS — S32000A Wedge compression fracture of unspecified lumbar vertebra, initial encounter for closed fracture: Secondary | ICD-10-CM | POA: Insufficient documentation

## 2020-05-10 DIAGNOSIS — S32010S Wedge compression fracture of first lumbar vertebra, sequela: Secondary | ICD-10-CM | POA: Diagnosis not present

## 2020-05-10 MED ORDER — NAPROXEN 500 MG PO TABS: 500.0000 mg | ORAL_TABLET | Freq: Two times a day (BID) | ORAL | 1 refills | Status: DC
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
## 2020-05-13 ENCOUNTER — Other Ambulatory Visit: Payer: Self-pay | Admitting: Family Medicine

## 2020-05-13 DIAGNOSIS — Z794 Long term (current) use of insulin: Secondary | ICD-10-CM

## 2020-05-13 DIAGNOSIS — E1169 Type 2 diabetes mellitus with other specified complication: Secondary | ICD-10-CM

## 2020-06-20 ENCOUNTER — Other Ambulatory Visit: Payer: Self-pay | Admitting: Family Medicine

## 2020-06-20 DIAGNOSIS — E118 Type 2 diabetes mellitus with unspecified complications: Secondary | ICD-10-CM

## 2020-07-05 NOTE — Congregational Nurse Program (Signed)
  Dept: [phone]   Congregational Nurse Program Note  Date of Encounter: 07/05/2020  Past Medical History: Past Medical History:  Diagnosis Date  . Alcohol abuse   . Diabetes mellitus without complication (HCC)   . Hypertension     Encounter Details:  Patient came in for blood pressure check.  Arman Bogus RN BSn PCCN  [facility] [phone]-cell [phone]-office

## 2020-09-20 ENCOUNTER — Other Ambulatory Visit: Payer: Self-pay | Admitting: Family Medicine

## 2020-09-20 DIAGNOSIS — E118 Type 2 diabetes mellitus with unspecified complications: Secondary | ICD-10-CM

## 2020-11-01 ENCOUNTER — Other Ambulatory Visit: Payer: Self-pay | Admitting: Family Medicine

## 2020-11-01 ENCOUNTER — Other Ambulatory Visit: Payer: Self-pay | Admitting: Student

## 2020-11-01 DIAGNOSIS — I10 Essential (primary) hypertension: Secondary | ICD-10-CM

## 2020-11-01 DIAGNOSIS — E118 Type 2 diabetes mellitus with unspecified complications: Secondary | ICD-10-CM

## 2020-12-06 ENCOUNTER — Other Ambulatory Visit: Payer: Self-pay | Admitting: Student

## 2020-12-06 DIAGNOSIS — E118 Type 2 diabetes mellitus with unspecified complications: Secondary | ICD-10-CM

## 2020-12-07 ENCOUNTER — Other Ambulatory Visit: Payer: Self-pay | Admitting: Family Medicine

## 2020-12-07 DIAGNOSIS — E1169 Type 2 diabetes mellitus with other specified complication: Secondary | ICD-10-CM

## 2020-12-07 DIAGNOSIS — Z794 Long term (current) use of insulin: Secondary | ICD-10-CM

## 2020-12-07 DIAGNOSIS — R7309 Other abnormal glucose: Secondary | ICD-10-CM

## 2020-12-07 LAB — GLUCOSE, POCT (MANUAL RESULT ENTRY): POC Glucose: 430 mg/dl — AB (ref 70–99)

## 2020-12-07 NOTE — Congregational Nurse Program (Signed)
  Dept: [phone]   Congregational Nurse Program Note  Date of Encounter: 12/07/2020  Past Medical History: Past Medical History:  Diagnosis Date   Alcohol abuse    Diabetes mellitus without complication (HCC)    Hypertension     Encounter Details:  CNP Questionnaire - 12/07/20 1303       Questionnaire   Do you give verbal consent to treat you today? Yes    Visit Setting Church or Organization    Location Patient Served At [facility]    Patient Status Refugee    Insurance Medicaid    Intervention Advocate;Educate;Refer;Assess (including screenings);Support;Counsel    Transportation Need transportation assistance;Provided transportation assistance ([facility] transp,bus pass, taxi voucher, etc.)    Medication Have medication insecurities    Referrals PCP - other provider    ED Visit Averted Yes            Patient is requesting assistance to schedule appointment with orthopedic doctor for back pain. Appointment scheduled for October 5th at 09:30 am at [facility]  Patient blood sugar 430. He did not take his lantus yesterday and today. He also took some OJ this morning for breakfast. Patient educated regarding compliance with medications and diet.Instructed to seek medical attention if blood sugar does not normalize.   Nicole Cella Teliah Buffalo RN BSn PCCN  [facility] [phone]-cell [phone]-office

## 2020-12-08 ENCOUNTER — Telehealth: Payer: Self-pay

## 2020-12-08 NOTE — Telephone Encounter (Signed)
Patient has been called and a voice message was left to call [facility] and schedule appointment.    Assistance by [employer] # [phone].  Glennie Hawk, CMA

## 2020-12-08 NOTE — Telephone Encounter (Signed)
-----   Message from Bess Kinds, MD sent at 12/06/2020 11:42 PM EDT ----- Regarding: Schedule appointment Hey Team!  It looks like this patient hasn't had an appointment in a while. Can we schedule a check up? Look's like this patients diabetes hasn't been adjusted/discussed in nearly a year.  Thank you for your help!!  -BS

## 2020-12-13 ENCOUNTER — Telehealth: Payer: Self-pay

## 2020-12-13 NOTE — Telephone Encounter (Addendum)
Called to set up an appointment with a PCP and to provide transportation assistance. Appointment scheduled for October 6th at 2:30pm. Patient called and made aware.   Also patient reports that his sugar this morning was 114 which is notable better compared to last weeks' reading.  Nicole Cella Tamirah George RN BSn PCCN  [facility] [phone]-cell [phone]-office

## 2020-12-19 ENCOUNTER — Other Ambulatory Visit: Payer: Self-pay | Admitting: Student

## 2020-12-19 DIAGNOSIS — E118 Type 2 diabetes mellitus with unspecified complications: Secondary | ICD-10-CM

## 2020-12-21 ENCOUNTER — Encounter: Payer: Self-pay | Admitting: Orthopaedic Surgery

## 2020-12-21 ENCOUNTER — Telehealth: Payer: Self-pay

## 2020-12-21 ENCOUNTER — Ambulatory Visit: Payer: Self-pay

## 2020-12-21 ENCOUNTER — Ambulatory Visit (INDEPENDENT_AMBULATORY_CARE_PROVIDER_SITE_OTHER): Payer: Medicaid Other | Admitting: Orthopaedic Surgery

## 2020-12-21 ENCOUNTER — Other Ambulatory Visit: Payer: Self-pay

## 2020-12-21 VITALS — BP 139/61 | Ht 67.0 in | Wt 170.0 lb

## 2020-12-21 DIAGNOSIS — G8929 Other chronic pain: Secondary | ICD-10-CM | POA: Diagnosis not present

## 2020-12-21 DIAGNOSIS — M545 Low back pain, unspecified: Secondary | ICD-10-CM | POA: Diagnosis not present

## 2020-12-21 NOTE — Patient Instructions (Signed)
It was great to see you! Thank you for allowing me to participate in your care!  I recommend that you always bring your medications to each appointment as this makes it easy to ensure we are on the correct medications and helps [place] not miss when refills are needed.  Our plans for today:  - Check Blood Work  -Hgb A1c, BMP, Lipid panel - Follow up appointments  12/26/20 - Pharmacy appointment to discuss medications  01/06/21 - To discuss the back/side pain he's been having  We are checking some labs today, I will call you if they are abnormal will send you a MyChart message or a letter if they are normal.  If you do not hear about your labs in the next 2 weeks please let us know.  Take care and seek immediate care sooner if you develop any concerns.   Dr. Bess Kinds, MD [facility]
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
## 2020-12-21 NOTE — Telephone Encounter (Signed)
Transportation scheduled with [employer] for today`s appointment with orthopedic and tomorrow with primary care provider.  Nicole Cella Makaleigh Reinard RN BSn PCCN  [facility] [phone]-cell [phone]-office
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
## 2020-12-22 ENCOUNTER — Encounter: Payer: Self-pay | Admitting: Student

## 2020-12-22 ENCOUNTER — Ambulatory Visit (INDEPENDENT_AMBULATORY_CARE_PROVIDER_SITE_OTHER): Payer: Medicare Other | Admitting: Student

## 2020-12-22 ENCOUNTER — Other Ambulatory Visit: Payer: Self-pay

## 2020-12-22 VITALS — BP 125/86 | HR 72 | Ht 67.0 in | Wt 160.0 lb

## 2020-12-22 DIAGNOSIS — E782 Mixed hyperlipidemia: Secondary | ICD-10-CM | POA: Diagnosis not present

## 2020-12-22 DIAGNOSIS — E1169 Type 2 diabetes mellitus with other specified complication: Secondary | ICD-10-CM | POA: Diagnosis not present

## 2020-12-22 DIAGNOSIS — G8929 Other chronic pain: Secondary | ICD-10-CM | POA: Diagnosis not present

## 2020-12-22 DIAGNOSIS — M545 Low back pain, unspecified: Secondary | ICD-10-CM

## 2020-12-22 DIAGNOSIS — Z794 Long term (current) use of insulin: Secondary | ICD-10-CM

## 2020-12-22 DIAGNOSIS — I10 Essential (primary) hypertension: Secondary | ICD-10-CM

## 2020-12-22 LAB — POCT GLYCOSYLATED HEMOGLOBIN (HGB A1C): HbA1c POC (<> result, manual entry): >15 % (ref 4.0–5.6)

## 2020-12-22 NOTE — Assessment & Plan Note (Addendum)
Patient taking insulin at home and reports blood sugars range from 140-160. Patient recently ran out of meds and is not sure what he has not been taking. Patient will need reexamination of diabetic regimen, once med reconciliation is complete. -Hgb A1C today > 15

## 2020-12-22 NOTE — Assessment & Plan Note (Signed)
Patient with history of HLD, taking medicine, but not sure which medicine he takes. And he recently ran out -f/u Lipid Panel

## 2020-12-22 NOTE — Assessment & Plan Note (Signed)
Patient BP 125/86 with no complaints -f/u BMP

## 2020-12-22 NOTE — Assessment & Plan Note (Addendum)
Patient still complaining of pain -Will see him for f/u visit to discuss pain and management. Apt 10/21

## 2020-12-23 LAB — BASIC METABOLIC PANEL
BUN/Creatinine Ratio: 15 (ref 10–24)
BUN: 13 mg/dL (ref 8–27)
CO2: 27 mmol/L (ref 20–29)
Calcium: 8.8 mg/dL (ref 8.6–10.2)
Chloride: 101 mmol/L (ref 96–106)
Creatinine, Ser: 0.87 mg/dL (ref 0.76–1.27)
Glucose: 325 mg/dL — ABNORMAL HIGH (ref 70–99)
Potassium: 5.1 mmol/L (ref 3.5–5.2)
Sodium: 138 mmol/L (ref 134–144)
eGFR: 95 mL/min/{1.73_m2} (ref >59)

## 2020-12-23 LAB — LIPID PANEL
Chol/HDL Ratio: 2.6 ratio (ref 0.0–5.0)
Cholesterol, Total: 142 mg/dL (ref 100–199)
HDL: 55 mg/dL (ref >39)
LDL Chol Calc (NIH): 72 mg/dL (ref 0–99)
Triglycerides: 77 mg/dL (ref 0–149)
VLDL Cholesterol Cal: 15 mg/dL (ref 5–40)

## 2020-12-23 NOTE — Congregational Nurse Program (Signed)
  Dept: [phone]   Congregational Nurse Program Note  Date of Encounter: 08/06/2019  Past Medical History: Past Medical History:  Diagnosis Date   Alcohol abuse    Diabetes mellitus without complication (HCC)    Hypertension     Encounter Details:  CNP Questionnaire - 12/23/20 0849       Questionnaire   Do you give verbal consent to treat you today? Yes    Location Patient Served  [facility]    Visit Setting Church or Organization    Patient Status Immigrant    Insurance [facility]    Insurance Referral N/A    Medication N/A    Medical Provider Yes    Screening Referrals N/A    Medical Referral N/A    Medical Appointment Made N/A    Food N/A    Transportation Need transportation assistance    Housing/Utilities No permanent housing    Interpersonal Safety Do not feel safe at current residence    Intervention N/A    ED Visit Averted Yes    Life-Saving Intervention Made N/A    Patient Status Immigrant

## 2020-12-26 ENCOUNTER — Other Ambulatory Visit: Payer: Self-pay

## 2020-12-26 ENCOUNTER — Telehealth: Payer: Self-pay

## 2020-12-26 ENCOUNTER — Ambulatory Visit (INDEPENDENT_AMBULATORY_CARE_PROVIDER_SITE_OTHER): Payer: Medicare Other | Admitting: Pharmacist

## 2020-12-26 ENCOUNTER — Other Ambulatory Visit: Payer: Self-pay | Admitting: Family Medicine

## 2020-12-26 ENCOUNTER — Encounter: Payer: Self-pay | Admitting: Student

## 2020-12-26 DIAGNOSIS — E785 Hyperlipidemia, unspecified: Secondary | ICD-10-CM | POA: Diagnosis not present

## 2020-12-26 DIAGNOSIS — Z794 Long term (current) use of insulin: Secondary | ICD-10-CM | POA: Diagnosis not present

## 2020-12-26 DIAGNOSIS — E1169 Type 2 diabetes mellitus with other specified complication: Secondary | ICD-10-CM

## 2020-12-26 DIAGNOSIS — E118 Type 2 diabetes mellitus with unspecified complications: Secondary | ICD-10-CM

## 2020-12-26 DIAGNOSIS — K5900 Constipation, unspecified: Secondary | ICD-10-CM

## 2020-12-26 MED ORDER — INSULIN LISPRO 100 UNIT/ML IJ SOLN: 10.0000 [IU] | Freq: Every day | INTRAMUSCULAR | 0 refills | Status: DC

## 2020-12-26 NOTE — Progress Notes (Signed)
Reviewed: I agree with Dr. Koval's documentation and management. 

## 2020-12-26 NOTE — Progress Notes (Signed)
Sent letter with CMP and lipid panel results.  All WNL with exception of high blood glucose. Pt had apt today with [name] to manage diabetes.

## 2020-12-26 NOTE — Progress Notes (Signed)
    S:     Chief Complaint  Patient presents with   Medication Management    Medication Review    Patient arrives in good spirits, walking without assistance. Presents for diabetes evaluation, education, and management Patient was referred and last seen by Primary Care Provider, Dr. Barbaraann Faster on 12/22/2020.  Visit was conducted through interpreter services - Satie [id number] DID NOT bring medications to today' visit.   Patient reports Diabetes was diagnosed in 2016 He reports starting insulin in 2018.  He reports checking blood sugars daily in the morning.  He reports readings in the low 100s each morning.   He  denies any significant low readings.    Insurance coverage/medication affordability: Medicaid  Medication adherence reported good however he is not taking any pills more than once daily.  He is taking his miralax twice daily.    Current diabetes medications include: Lantus (insulin glargine) 30 alternating with 40 units each evening prior to his largest meal of the day.   Current hypertension medications include: lisinipril 2.5mg  daily Current hyperlipidemia medications include: atorvastatin 40mg  once daily  Patient denies hypoglycemic events.  Patient reported dietary habits: Eats 2 meals/day Breakfast:Bread/Tea Lunch:minimal Dinner: largest meal of the day 4:00 - 5:00 PM Snacks:Apples Drinks:Tea in the morning,  tries to avoid juices, plenty of water 8-10 bottles per day?    O:  Physical Exam Constitutional:      Appearance: He is normal weight.  Pulmonary:     Effort: Pulmonary effort is normal.  Neurological:     Mental Status: He is alert.  Psychiatric:        Mood and Affect: Mood normal.        Behavior: Behavior normal.    Review of Systems  Musculoskeletal:  Positive for back pain.   Reports this has continued and he has a PCP follow-up visit  scheduled  Lab Results  Component Value Date   HGBA1C >15.0 12/22/2020   There were no vitals filed for  this visit.  Lipid Panel     Component Value Date/Time   CHOL 142 12/22/2020 1648   TRIG 77 12/22/2020 1648   HDL 55 12/22/2020 1648   CHOLHDL 2.6 12/22/2020 1648   CHOLHDL 5.8 (H) 06/03/2015 1640   VLDL 34 (H) 06/03/2015 1640   LDLCALC 72 12/22/2020 1648    Home fasting blood sugars: reported to be low 100- 150mg /dl since he increased his long-acting insulin.  2 hour post-meal/random blood sugars: Not testing.  A/P: Diabetes longstanding since 2016, currently poor control based on most recent A1c. Patient was able to verbalize appropriate hypoglycemia management plan. Medication adherence is likely better than anticipated based on A1c. Control is suboptimal due to lack of prandial insulin. -Adjusted dose of basal insulin Lantus (insulin glargine) to 30mg  EACH day (patient preference to keep at dinner time).  -Initiated a trial of  rapid insulin Humalog (insulin lispro) at 10 units daily prior to evening meal.  - Continued to HOLD  SGLT2-I Jardiance (generic name empagliflozin) due to current poor control. Consider re-initiation in the future.    Hypertension longstanding and currently at goal.  Continue lisinopril.   Written patient instructions provided.  Total time in face to face counseling 50 minutes.   Follow up Pharmacist Clinic Visit in 2 weeks and PCP in 2.5 weeks.   Patient seen with , PharmD Candidate.

## 2020-12-26 NOTE — Telephone Encounter (Signed)
Patient called with appointment reminder and informed that transportation assistance will be provided with pick up time of 11:49 am. Patient verbalized understanding.  Nicole Cella Aymar Whitfill RN BSn PCCN  [facility] [phone]-cell [phone]-office

## 2020-12-26 NOTE — Assessment & Plan Note (Signed)
Diabetes longstanding since 2016, currently poor control based on most recent A1c. Patient was able to verbalize appropriate hypoglycemia management plan. Medication adherence is likely better than anticipated based on A1c. Control is suboptimal due to lack of prandial insulin. -Adjusted dose of basal insulin Lantus (insulin glargine) to 30mg  EACH day (patient preference to keep at dinner time).  -Initiated a trial of  rapid insulin Humalog (insulin lispro) at 10 units daily prior to evening meal.  -Continued to HOLD SGLT2-I Jardiance (generic name empagliflozin) due to current poor control. Consider re-initiation in the future.

## 2020-12-26 NOTE — Patient Instructions (Addendum)
Nice to meet you today.   Continue Lantus 30 units each day prior to dinner.   Additionally, take Humalog 10 units each day prior to dinner.    Please bring all medications pills and insulin injections to follow-up in two weeks with Pharmacist Raymondo Band).   Also, Please schedule with Dr. Barbaraann Faster as soon as you can be seen.   Next visit in two weeks  10/24 at 9:45

## 2020-12-28 DIAGNOSIS — R739 Hyperglycemia, unspecified: Secondary | ICD-10-CM

## 2020-12-28 LAB — GLUCOSE, POCT (MANUAL RESULT ENTRY): POC Glucose: 315 mg/dl — AB (ref 70–99)

## 2020-12-28 NOTE — Congregational Nurse Program (Signed)
  Dept: [phone]   Congregational Nurse Program Note  Date of Encounter: 12/28/2020  Past Medical History: Past Medical History:  Diagnosis Date   Alcohol abuse    Diabetes mellitus without complication (HCC)    Hypertension     Encounter Details:  CNP Questionnaire - 12/28/20 1605       Questionnaire   Do you give verbal consent to treat you today? Yes    Location [facility] Served  [facility]    Visit Setting Church or Organization    Insurance [place]    Medication Have Medication Insecurities;Provided Medication Assistance    Medical Provider Yes    Screening Referrals N/A    Medical Referral Other    Medical Appointment Made Non-[facility] virtual visit    Food N/A    Transportation Need transportation assistance;Provided transportation assistance    Housing/Utilities N/A    Interpersonal Safety N/A    Intervention Blood pressure;Blood glucose    ED Visit Averted Yes    Life-Saving Intervention Made N/A    Patient Status Refugee           Patient came in requesting assistance to schedule MRI, same done with [facility] imaging.Blood sugar today 315. Patient skipped medication today morning. I have reviewed all of his medication again with him and emphasized the importance of compliance.   Nicole Cella Jenevieve Kirschbaum RN BSn PCCN  [facility] [phone]-cell [phone]-office

## 2020-12-29 ENCOUNTER — Telehealth: Payer: Self-pay | Admitting: Orthopaedic Surgery

## 2020-12-29 NOTE — Telephone Encounter (Signed)
Pt will call with kids on phone to verfiy appt. Pt states he really don't understand a lot of [place]

## 2021-01-05 MED ORDER — ATORVASTATIN CALCIUM 40 MG PO TABS: ORAL_TABLET | ORAL | 3 refills | Status: DC

## 2021-01-05 MED ORDER — POLYETHYLENE GLYCOL 3350 17 GM/SCOOP PO POWD: ORAL | 6 refills | Status: DC

## 2021-01-05 NOTE — Addendum Note (Signed)
Addended by: Bess Kinds T on: 01/05/2021 12:22 PM   Modules accepted: Orders

## 2021-01-09 ENCOUNTER — Ambulatory Visit
Admission: RE | Admit: 2021-01-09 | Discharge: 2021-01-09 | Disposition: A | Discharge status: Home / Self Care | Payer: Medicaid Other | Source: Ambulatory Visit | Attending: Orthopaedic Surgery | Admitting: Orthopaedic Surgery

## 2021-01-09 ENCOUNTER — Encounter: Payer: Self-pay | Admitting: Pharmacist

## 2021-01-09 ENCOUNTER — Other Ambulatory Visit: Payer: Self-pay

## 2021-01-09 ENCOUNTER — Telehealth: Payer: Self-pay

## 2021-01-09 ENCOUNTER — Ambulatory Visit (INDEPENDENT_AMBULATORY_CARE_PROVIDER_SITE_OTHER): Payer: Medicaid Other | Admitting: Pharmacist

## 2021-01-09 DIAGNOSIS — M545 Low back pain, unspecified: Secondary | ICD-10-CM

## 2021-01-09 DIAGNOSIS — Z794 Long term (current) use of insulin: Secondary | ICD-10-CM

## 2021-01-09 DIAGNOSIS — E118 Type 2 diabetes mellitus with unspecified complications: Secondary | ICD-10-CM | POA: Diagnosis not present

## 2021-01-09 DIAGNOSIS — E1169 Type 2 diabetes mellitus with other specified complication: Secondary | ICD-10-CM

## 2021-01-09 DIAGNOSIS — G8929 Other chronic pain: Secondary | ICD-10-CM

## 2021-01-09 DIAGNOSIS — M48061 Spinal stenosis, lumbar region without neurogenic claudication: Secondary | ICD-10-CM | POA: Diagnosis not present

## 2021-01-09 MED ORDER — INSULIN LISPRO 100 UNIT/ML IJ SOLN: 8.0000 [IU] | Freq: Every day | INTRAMUSCULAR | 1 refills | Status: DC

## 2021-01-09 MED ORDER — INSULIN GLARGINE 100 UNIT/ML ~~LOC~~ SOLN: 26.0000 [IU] | Freq: Every day | SUBCUTANEOUS | 1 refills | Status: DC

## 2021-01-09 NOTE — Telephone Encounter (Signed)
Patient contacted with appointment reminder.Transportation scheduled with pick up time 0900 am. Patient reminded to bring his medication to this appointment. I will scheduled to see him at my office tomorrow as well.  Nicole Cella Aryella Besecker RN BSn PCCN  [facility] [phone]-cell [phone]-office

## 2021-01-09 NOTE — Telephone Encounter (Signed)
Patient has appointment with [facility] for MRI at 3:50 pm with arrival time of 3:20pm. Transportation assistance provided. Pick up time of 3:00 pm.  Arman Bogus RN BSn PCCN  [facility] [phone]-cell [phone]-office

## 2021-01-09 NOTE — Progress Notes (Signed)
S:     Chief Complaint  Patient presents with   Medication Management    Diabetes - insulin management    Patient arrives in good spirits, ambulating without assistance. Visit conducted with use of interpreter: York Spaniel #[id number] Presents for diabetes evaluation, education, and management. Patient brought all of medications to visit in plastic bag.   Patient was last seen 2 weeks ago (12/26/2020) in pharmacist clinic visit following referral from PCP, Dr. Barbaraann Faster 12/22/2020.  Diabetes was recently diagnosed with elevated A1C of > 15. Insurance coverage/medication affordability: Medicaid  Patient has new complaint of increase in constipation over the last few weeks.  He reports continued drinking of about 12 bottles of water per day which is reduced from previous 20-24 bottles per day.  Medication adherence reported good.   Current diabetes medications include: Lantus (insulin glargine)  30 units and Humalog (insulin lispro) 10 units once daily.  Both insulins are injected at time of largest meal (late afternoon).  Current hyperlipidemia medications include: atorvastatin  Patient denies hypoglycemic events despite readings   O:  Physical Exam Constitutional:      Appearance: Normal appearance.  Pulmonary:     Effort: Pulmonary effort is normal.  Neurological:     Mental Status: He is alert.  Psychiatric:        Mood and Affect: Mood normal.        Thought Content: Thought content normal.        Judgment: Judgment normal.    Review of Systems  Musculoskeletal:  Positive for back pain.  All other systems reviewed and are negative.  Lab Results  Component Value Date   HGBA1C >15.0 12/22/2020   There were no vitals filed for this visit.  Lipid Panel     Component Value Date/Time   CHOL 142 12/22/2020 1648   TRIG 77 12/22/2020 1648   HDL 55 12/22/2020 1648   CHOLHDL 2.6 12/22/2020 1648   CHOLHDL 5.8 (H) 06/03/2015 1640   VLDL 34 (H) 06/03/2015 1640   LDLCALC 72  12/22/2020 1648    Home fasting blood sugars recorded as: 65, 76, 148, 60, 74, 78, 110, 99, 100, 140, 100, 70  or the last 12 days.  Patient reports blood pressure is "not working for the last two weeks" and requests a new BP meter  Blood pressure was reading in the 120-140s for systolic.   Clinical Atherosclerotic Cardiovascular Disease (ASCVD): No  The 10-year ASCVD risk score (Arnett DK, et al., 2019) is: 26%   Values used to calculate the score:     Age: 65 years     Sex: Male     Is Non-Hispanic African American: Yes     Diabetic: Yes     Tobacco smoker: No     Systolic Blood Pressure: 125 mmHg     Is BP treated: Yes     HDL Cholesterol: 55 mg/dL     Total Cholesterol: 142 mg/dL   A/P: Diabetes currently with improved control likely related to basal bolus and improved adherence to current dosing regimen. Patient was counseled on appropriate hypoglycemia management plan. Medication adherence appears good.  -Due to patient's reported home fasting blood sugars occasionally low (65) with the other provided readings at goal, a decrease in daily insulin is warranted.  -Decreased dose of basal insulin Lantus (insulin glargine) from 30 units to 26 units daily before evening meal.  -Decreased dose of  rapid insulin Humalog (insulin lispro) from 10 units to 8 units before  evening meal.  -Counseled on s/sx of and management of hypoglycemia -Next A1C anticipated January 2023.  -Congratulated patient on impressive progress and encouraged to keep up the good work.   Hypertension currently controlled.  Blood pressure goal = <130/80  mmHg. Office reading was 130/68. Medication adherence good.  Continue lisinopril 2.5 mg daily. - Patient requested new blood pressure monitor - deferred to PCP visit.   Written patient instructions provided.  Total time in face to face counseling 30 minutes.   Follow up PCP Clinic Visit on 01/12/2021.   Patient seen with Ruben Im, PharmD Candidate.

## 2021-01-09 NOTE — Patient Instructions (Signed)
Nice to see you today.   Your blood sugar appears to be much better controlled.  Keep up the great work.   Insulin doses were changed to day: Lantus (insulin glargine) 26 units once daily prior to evening large meal.  Humalog (insulin lispro) 8 units once daily prior to evening large meal.   Follow-up with Dr. Barbaraann Faster on Thursday.

## 2021-01-09 NOTE — Progress Notes (Signed)
Reviewed: I agree with Dr. Koval's documentation and management. 

## 2021-01-09 NOTE — Assessment & Plan Note (Signed)
Diabetes currently with improved control likely related to basal bolus and improved adherence to current dosing regimen. Patient was counseled on appropriate hypoglycemia management plan. Medication adherence appears good.  -Due to patient's reported home fasting blood sugars occasionally low (65) with the other provided readings at goal, a decrease in daily insulin is warranted.  -Decreased dose of basal insulin Lantus (insulin glargine) from 30 units to 26 units daily before evening meal.  -Decreased dose of  rapid insulin Humalog (insulin lispro) from 10 units to 8 units before evening meal.  -Counseled on s/sx of and management of hypoglycemia -Next A1C anticipated January 2023.  -Congratulated patient on impressive progress and encouraged to keep up the good work.

## 2021-01-11 NOTE — Congregational Nurse Program (Signed)
  Dept: [phone]   Congregational Nurse Program Note  Date of Encounter: 01/11/2021  Past Medical History: Past Medical History:  Diagnosis Date   Alcohol abuse    Diabetes mellitus without complication (HCC)    Hypertension     Encounter Details:   Pt brought in recently mailed medications Metformin 1000mg  twice daily with food, Humalog 8 units daily with dinner, Latus pre-filled pens 26 units daily before dinner.   Pharmacy requested prescription and MD was informed.  Pharmacy contacted regarding sure comfort pen needle 12mm 32 gauge. They were not delivered. Pt will receive education after the pen needles are delivered to his home.   Pt was educated on how to take Metformin and Humalog with teach back. He verbalized understanding. He come back next week with all his medications for review.  Transportation was requested for tomorrows appointment.  11m Camdin Hegner RN BSn PCCN  [facility] [phone]-cell [phone]-office
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
## 2021-01-12 ENCOUNTER — Telehealth: Payer: Self-pay | Admitting: Pharmacist

## 2021-01-12 ENCOUNTER — Encounter: Payer: Self-pay | Admitting: Student

## 2021-01-12 ENCOUNTER — Other Ambulatory Visit: Payer: Self-pay

## 2021-01-12 ENCOUNTER — Ambulatory Visit (INDEPENDENT_AMBULATORY_CARE_PROVIDER_SITE_OTHER): Payer: Medicare Other | Admitting: Student

## 2021-01-12 DIAGNOSIS — M545 Low back pain, unspecified: Secondary | ICD-10-CM

## 2021-01-12 DIAGNOSIS — G8929 Other chronic pain: Secondary | ICD-10-CM

## 2021-01-12 MED ORDER — INSULIN PEN NEEDLE 31G X 5 MM MISC: 1.0000 | Freq: Four times a day (QID) | 99 refills | Status: DC

## 2021-01-12 MED ORDER — BACLOFEN 10 MG PO TABS: 10.0000 mg | ORAL_TABLET | Freq: Every day | ORAL | 0 refills | Status: DC

## 2021-01-12 NOTE — Assessment & Plan Note (Signed)
Patient back pain poorly managed at this time. Patient reports he was taking a medicine that helped but is no longer helping. I am wondering if patient has been taking his baclofen regularly. He should have run out of medicine by now, and he still has signs and symptoms of a tight, spasmodic lower back. Patient was expecting me to review latest MRI and discuss plans going forward. Patient was consoled that he needed to follow up with his Orthopedist in November for better understanding of MRI and plans going forward. MRI was reviewed with patient. Patient MRI concerning for mild-moderate spinal canal narrowing. Given imaging and clinical picture, patient likely suffering from Spinal Stenosis.   -Continue Baclofen 10 mg in the evenings -Handout on spinale stenosis given -Follow up with Orthopedist Nov 1st -Follow up with [facility] Nov 29th

## 2021-01-12 NOTE — Telephone Encounter (Signed)
Request to send insulin needles.   Sent to [employer].

## 2021-01-12 NOTE — Telephone Encounter (Signed)
-----   Message from Debbora Presto, RN sent at 01/11/2021 10:53 AM EDT ----- Regarding: medication Good morning Dr Barbaraann Faster,  Randy Fisher came to see me today.unfortunately he only brought the 3 medications that were mailed to him recently. 1)Metformin 1000 mg twice daily with meals -education provided with teach back. 2)Humalog 8 units daily before supper-education provided with teach back 3)Lantus prefilled pen 26 units daily before supper-education Not provided. We are missing Sure Comfort pen needles. 68mm 32 gauge. I called his pharmacy and they requested a prescription from you.  Thank you. Dorothy.

## 2021-01-12 NOTE — Patient Instructions (Addendum)
It was great to see you! Thank you for allowing me to participate in your care! The results of the MRI show some narrowing of your spinal canal. I've attached a hand out about it. Your Orthopedic doctor will discuss the MRI in greater detail and help make plans from there.  I recommend that you always bring your medications to each appointment as this makes it easy to ensure we are on the correct medications and helps [place] not miss when refills are needed.  Our plans for today:  - Continue taking Baclofen at night to help with back pain - Follow up with Orthopedics in 11/1 at 3:30 - Follow up with clinic 11/29 at 1:30 pm  Take care and seek immediate care sooner if you develop any concerns.   Dr. Bess Kinds, MD [facility]

## 2021-01-17 ENCOUNTER — Ambulatory Visit (INDEPENDENT_AMBULATORY_CARE_PROVIDER_SITE_OTHER): Payer: Medicaid Other | Admitting: Orthopaedic Surgery

## 2021-01-17 ENCOUNTER — Encounter: Payer: Self-pay | Admitting: Orthopaedic Surgery

## 2021-01-17 ENCOUNTER — Telehealth: Payer: Self-pay

## 2021-01-17 ENCOUNTER — Other Ambulatory Visit: Payer: Self-pay

## 2021-01-17 VITALS — Ht 66.0 in | Wt 171.0 lb

## 2021-01-17 DIAGNOSIS — M5459 Other low back pain: Secondary | ICD-10-CM | POA: Diagnosis not present

## 2021-01-17 DIAGNOSIS — M47816 Spondylosis without myelopathy or radiculopathy, lumbar region: Secondary | ICD-10-CM

## 2021-01-17 DIAGNOSIS — S32010S Wedge compression fracture of first lumbar vertebra, sequela: Secondary | ICD-10-CM
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
## 2021-01-17 NOTE — Telephone Encounter (Signed)
Patient called with appointment reminder. Transportation assistance provided by [employer]. Pick up time 3:05pm  Nicole Cella Gevorg Brum RN BSn PCCN  [facility] [phone]-cell [phone]-office

## 2021-01-25 ENCOUNTER — Other Ambulatory Visit: Payer: Self-pay | Admitting: Family Medicine

## 2021-01-25 DIAGNOSIS — E118 Type 2 diabetes mellitus with unspecified complications: Secondary | ICD-10-CM

## 2021-02-02 ENCOUNTER — Other Ambulatory Visit: Payer: Self-pay | Admitting: Family Medicine

## 2021-02-02 DIAGNOSIS — Z794 Long term (current) use of insulin: Secondary | ICD-10-CM
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
## 2021-02-14 ENCOUNTER — Ambulatory Visit (INDEPENDENT_AMBULATORY_CARE_PROVIDER_SITE_OTHER): Payer: Medicaid Other

## 2021-02-14 ENCOUNTER — Other Ambulatory Visit: Payer: Self-pay

## 2021-02-14 ENCOUNTER — Ambulatory Visit (INDEPENDENT_AMBULATORY_CARE_PROVIDER_SITE_OTHER): Payer: Medicaid Other | Admitting: Family Medicine

## 2021-02-14 VITALS — BP 128/78 | HR 71 | Ht 66.0 in | Wt 163.8 lb

## 2021-02-14 DIAGNOSIS — Z794 Long term (current) use of insulin: Secondary | ICD-10-CM | POA: Diagnosis not present

## 2021-02-14 DIAGNOSIS — Z23 Encounter for immunization: Secondary | ICD-10-CM | POA: Diagnosis not present

## 2021-02-14 DIAGNOSIS — S32010S Wedge compression fracture of first lumbar vertebra, sequela: Secondary | ICD-10-CM

## 2021-02-14 DIAGNOSIS — I10 Essential (primary) hypertension: Secondary | ICD-10-CM

## 2021-02-14 DIAGNOSIS — E1169 Type 2 diabetes mellitus with other specified complication: Secondary | ICD-10-CM | POA: Diagnosis not present

## 2021-02-14 NOTE — Assessment & Plan Note (Signed)
-   taking medications as prescribed, denies hypoglycemic symptoms, continue current regimen, next a1c in January

## 2021-02-14 NOTE — Assessment & Plan Note (Signed)
-   has seen ortho, non-operative, reviewed his medications today and increased gabapentin to 300mg  TID and baclofen 10mg  PRN qHS, put black circle on bottles for as needed pain meds

## 2021-02-14 NOTE — Assessment & Plan Note (Signed)
-   at goal, continue home lisinopril 2.5 mg daily

## 2021-02-14 NOTE — Patient Instructions (Signed)
It was wonderful to see you today.  Please bring ALL of your medications with you to every visit.   Today we talked about:  - We will see you back in one month   Thank you for choosing [facility].   Please call [phone] with any questions about today's appointment.  Please be sure to schedule follow up at the front  desk before you leave today.   Burley Saver, MD  Family Medicine

## 2021-02-21 ENCOUNTER — Other Ambulatory Visit: Payer: Self-pay

## 2021-02-21 DIAGNOSIS — R739 Hyperglycemia, unspecified: Secondary | ICD-10-CM

## 2021-02-21 LAB — GLUCOSE, POCT (MANUAL RESULT ENTRY): POC Glucose: 364 mg/dl — AB (ref 70–99)

## 2021-02-21 NOTE — Congregational Nurse Program (Signed)
Patient brought in medications from last visit 11/29 to confirm how to take. RN confirmed patient understood medication regimen from before, clarified timing of one medication. Demonstrated how to use Lantus pen. Teach back, patient demonstrated ability to use pen and verbalized how and when to use it. Patient verbalized that back pain is currently only mild. Blood sugar taken. Education provided by RN in patient's preferred language.  Regino Schultze, RN Congregational Nursing Program

## 2021-02-21 NOTE — Progress Notes (Unsigned)
po

## 2021-02-28 NOTE — Congregational Nurse Program (Signed)
Patient would like to know his next appointment date and time. He does not want blood pressure or sugar check today. Next appointment is on 04/04/21 at 1:30pm. Patient made aware and has transportation. He is able to drive because "my back is better".   Nicole Cella Kona Lover RN BSn PCCN  [facility] [phone]-cell [phone]-office

## 2021-03-09 ENCOUNTER — Other Ambulatory Visit: Payer: Self-pay | Admitting: Family Medicine

## 2021-03-09 DIAGNOSIS — E118 Type 2 diabetes mellitus with unspecified complications: Secondary | ICD-10-CM

## 2021-03-15 ENCOUNTER — Other Ambulatory Visit: Payer: Self-pay | Admitting: Family Medicine

## 2021-03-15 DIAGNOSIS — Z794 Long term (current) use of insulin: Secondary | ICD-10-CM

## 2021-04-04 ENCOUNTER — Other Ambulatory Visit: Payer: Self-pay

## 2021-04-04 ENCOUNTER — Ambulatory Visit (INDEPENDENT_AMBULATORY_CARE_PROVIDER_SITE_OTHER): Payer: Medicare Other | Admitting: Family Medicine

## 2021-04-04 VITALS — BP 122/85 | HR 93 | Ht 66.0 in | Wt 154.0 lb

## 2021-04-04 DIAGNOSIS — Z794 Long term (current) use of insulin: Secondary | ICD-10-CM

## 2021-04-04 DIAGNOSIS — S32010S Wedge compression fracture of first lumbar vertebra, sequela: Secondary | ICD-10-CM | POA: Diagnosis not present

## 2021-04-04 DIAGNOSIS — E1169 Type 2 diabetes mellitus with other specified complication: Secondary | ICD-10-CM | POA: Diagnosis not present

## 2021-04-04 LAB — POCT GLYCOSYLATED HEMOGLOBIN (HGB A1C): HbA1c POC (<> result, manual entry): >15 % (ref 4.0–5.6)
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
## 2021-04-04 NOTE — Patient Instructions (Addendum)
It was wonderful to see you today.  Please bring ALL of your medications with you to every visit.   Today we talked about:  - we will have you decrease your long acting insulin to 20 units at night, and increase your mealtime insulin to  10 units with both of your meals  - We will also have you take a third dose of your gabapentin in the middle of the day to help address the pain you're having between doses  - You have a follow up appointment on February 15th with Dr Nicholaus Bloom at 1:30 PM  Thank you for choosing [facility].   Please call [phone] with any questions about today's appointment.  Please be sure to schedule follow up at the front  desk before you leave today.   Burley Saver, MD  Family Medicine

## 2021-04-04 NOTE — Assessment & Plan Note (Signed)
Suspect that am lows are secondary to too high a dose of his basal insulin. Will decrease LAI to 20u nightly and increase Humalog to 10u with each of his two meals.  - Scheduled for diabetes follow-up with Dr. Nicholaus Bloom.

## 2021-04-04 NOTE — Assessment & Plan Note (Addendum)
Will add a mid-day dose of gabapentin to pain regimen. Per Dr. Marlene Bast last note, he may follow-up with orthopedic surgery as needed, though he is likely not a surgical candidate. No red flag symptoms or new findings on physical exam. Will reassess pain control at follow-up.  - Baclofen 10mg  daily - Gabapentin 300mg  TID

## 2021-04-11 DIAGNOSIS — R7309 Other abnormal glucose: Secondary | ICD-10-CM

## 2021-04-11 LAB — GLUCOSE, POCT (MANUAL RESULT ENTRY): POC Glucose: 355 mg/dl — AB (ref 70–99)

## 2021-04-11 NOTE — Congregational Nurse Program (Signed)
°  Dept: [phone]   Congregational Nurse Program Note  Date of Encounter: 04/11/2021  Past Medical History: Past Medical History:  Diagnosis Date   Alcohol abuse    Diabetes mellitus without complication (HCC)    Hypertension     Encounter Details:  Patient came with current prescribed medications, plus an OTC bottle of CBD gummies which was mis-delivered to his home.  He asked the Congregational Nurse to dispose of the CBD.  The CBD to be returned to sender.  Vitals taken.  BP 117/75, HR 71, CBG 355. Daily glucose records for January, as taken by the patient, were in the range of 303-355.  Patient is taking Lantus 20 units; MAR states 26 units.  Patient is taking Novalog 10 units; MAR states 8 units. Patient states these changes were made during his last office visit.  Patient will RTC tomorrow for monitoring blood glucose.

## 2021-04-24 ENCOUNTER — Other Ambulatory Visit: Payer: Self-pay | Admitting: Student

## 2021-04-24 DIAGNOSIS — E1169 Type 2 diabetes mellitus with other specified complication: Secondary | ICD-10-CM

## 2021-04-28 LAB — GLUCOSE, POCT (MANUAL RESULT ENTRY): POC Glucose: 335 mg/dl — AB (ref 70–99)

## 2021-05-02 ENCOUNTER — Other Ambulatory Visit: Payer: Self-pay | Admitting: Family Medicine

## 2021-05-02 DIAGNOSIS — E119 Type 2 diabetes mellitus without complications: Secondary | ICD-10-CM

## 2021-05-02 DIAGNOSIS — G8929 Other chronic pain: Secondary | ICD-10-CM

## 2021-05-02 DIAGNOSIS — Z794 Long term (current) use of insulin: Secondary | ICD-10-CM

## 2021-05-02 DIAGNOSIS — M545 Low back pain, unspecified: Secondary | ICD-10-CM

## 2021-05-02 LAB — GLUCOSE, POCT (MANUAL RESULT ENTRY): POC Glucose: 571 mg/dl — AB (ref 70–99)

## 2021-05-02 MED ORDER — BACLOFEN 10 MG PO TABS: 10.0000 mg | ORAL_TABLET | Freq: Every day | ORAL | 1 refills | Status: DC

## 2021-05-02 NOTE — Congregational Nurse Program (Signed)
°  Dept: [phone]   Congregational Nurse Program Note  Date of Encounter: 05/02/2021  Past Medical History: Past Medical History:  Diagnosis Date   Alcohol abuse    Diabetes mellitus without complication (HCC)    Hypertension     Encounter Details:  CNP Questionnaire - 04/28/21 1034       Questionnaire   Do you give verbal consent to treat you today? Yes    Location Patient Served  Not Applicable    Visit Setting Other    Patient Status Refugee    Insurance [facility]    Insurance Referral N/A    Medication Provided Medication Assistance;Have Medication Insecurities    Medical Provider Yes    Screening Referrals N/A    Medical Referral N/A    Medical Appointment Made N/A    Food N/A    Transportation N/A    Housing/Utilities N/A    Interpersonal Safety N/A    Intervention Blood pressure;Blood glucose;Educate    ED Visit Averted N/A    Life-Saving Intervention Made N/A            Patient came in requesting Baclofen medication refill. Medication refill request sent to provider.Blood sugar  is elevated 571 today. He had breakfast but did not take Metformin. His blood sugar spikes when he is non compliant.He takes his insulin in the evening.Educated on medication compliance.He is going back home to take his medication.Advised to call  EMS for life threatening symptoms of hyperglycemia. He has appointment with provider tomorrow.  Randy Fisher Randy Berko RN BSn PCCN  [facility] [phone]-office

## 2021-05-02 NOTE — Congregational Nurse Program (Signed)
Patient seen at [facility] for BP and blood glucose screening.  BP 115/70 and pulse 72.  Blood glucose 335.  States he ate breakfast at 7:30 am and took insulin.  States it is not unusual for his blood sugars to be over 300.  Sees doctor regularly and has follow-up appointment on 05/03/2021.  Advised him to take copy of today's results to MD appointment.  Brantley Fling RN, Congregational Nurse [phone].

## 2021-05-03 ENCOUNTER — Ambulatory Visit (INDEPENDENT_AMBULATORY_CARE_PROVIDER_SITE_OTHER): Payer: Medicare Other | Admitting: Pharmacist

## 2021-05-03 ENCOUNTER — Other Ambulatory Visit: Payer: Self-pay

## 2021-05-03 DIAGNOSIS — E1169 Type 2 diabetes mellitus with other specified complication: Secondary | ICD-10-CM

## 2021-05-03 DIAGNOSIS — K5900 Constipation, unspecified: Secondary | ICD-10-CM

## 2021-05-03 DIAGNOSIS — Z794 Long term (current) use of insulin: Secondary | ICD-10-CM

## 2021-05-03 MED ORDER — POLYETHYLENE GLYCOL 3350 17 GM/SCOOP PO POWD: ORAL | 6 refills | Status: DC

## 2021-05-03 MED ORDER — OZEMPIC (0.25 OR 0.5 MG/DOSE) 2 MG/1.5ML ~~LOC~~ SOPN: 0.2500 mg | PEN_INJECTOR | SUBCUTANEOUS | 1 refills | Status: DC

## 2021-05-03 MED ORDER — LANTUS SOLOSTAR 100 UNIT/ML ~~LOC~~ SOPN: 24.0000 [IU] | PEN_INJECTOR | Freq: Every day | SUBCUTANEOUS | 1 refills | Status: DC

## 2021-05-03 NOTE — Assessment & Plan Note (Signed)
°  T2DM is not controlled likely due to sub-optimal lifestyle and patient taking bolus insulin Humalog after his meals rather than before. Medication adherence appears optimal. Additional pharmacotherapy is warranted. Will initiate Ozempic 0.25mg  once weekly. Will also increase patient's Lantus from 20 units to 24 units due to hyperglycemia. In the future can consider adding additional mealtime coverage during lunch but for now instructed patient to take Humalog BEFORE meals, not after. Will also work towards titrating up Ozempic to max dose of 2mg  once weekly. Patient educated on purpose, proper use and potential adverse effects of Ozempic.  Following instruction patient verbalized understanding of treatment plan.    1. Increased dose of basal insulin Lantus to 24 units once daily 2. Continued  rapid insulin Humalog 10 units twice a day with breakfast and dinner  3. Started GLP-1 Ozempic 0.25mg  once weekly  4. Continued metformin 1000mg  twice daily 5. Extensively discussed pathophysiology of diabetes, dietary effects on blood sugar control, and recommended lifestyle interventions 6. Counseled on s/sx of and management of hypoglycemia 7. Next A1C anticipated April 2023.

## 2021-05-03 NOTE — Patient Instructions (Addendum)
Bwana Sheu ilikuwa ni furaha [employer].  Tafadhali fanya yafuatayo:  1. Ongeza Lantus yako hadi vitengo 24 mara moja kila siku na uanze kutumia Ozempic 0.25mg  mara moja kwa wiki siku ile ile [employer]. Darlyne Russian Henrene Pastor Humalog yako vitengo 10 KABLA ya kifungua kinywa na KABLA ya chakula cha jioni kama ilivyoelekezwa leo wakati wa [place]. Lavonna Monarch una maswali yoyote au ikiwa unaamini kuwa jambo fulani limetokea kwa sababu ya mabadiliko haya, nipigie simu au daktari wako ili kumjulisha mmoja wetu. 2. Dione Plover sukari kwenye damu ukiwa nyumbani. Ni muhimu sana uyarekodi haya na kuyaleta kwenye miadi ya daktari yako ijayo. 3. Duffy Rhody mabadiliko ya mtindo wa maisha ambayo tumejadili pamoja wakati wa ziara yetu. Lishe na mazoezi vina jukumu kubwa Liston Alba kuboresha sukari yako ya damu. 4. Ufuatiliaji na Dk. Pleas Patricia mwezi mmoja   Hypoglycemia au sukari ya chini ya damu:  Westley Foots ya chini ya damu inaweza kutokea haraka na inaweza kuwa dharura ikiwa haitatibiwa mara moja.  Ingawa hii haipaswi [place] kwa [place], inaweza kuletwa ikiwa unaruka chakula au usile vya [place]. Erie Noe, ikiwa insulini yako au dawa zingine za kisukari zimewekwa juu [place], [place] inaweza kusababisha sukari yako ya damu [place].  Gilford Rile za onyo za kupungua kwa sukari ya damu ni pamoja na: 1. Kuhisi kutetemeka au kizunguzungu 2. Kujisikia dhaifu au uchovu 3. Njaa iliyopitiliza 4. Kuhisi wasiwasi au kufadhaika 5. Kutokwa na Arelia Longest hata usipofanya mazoezi  Nini cha kufanya ikiwa ninapata sukari ya chini ya damu? Fuata Kanuni ya 15 1. Angalia sukari yako ya damu na mita yako. Ikiwa chini ya 70, endelea hatua ya 2. 2. Tibu kwa gramu 15 za wanga zinazofanya kazi haraka ambazo zinapatikana kwenye vidonge vya glukosi 3-4. Ikiwa hazipatikani unaweza kujaribu pipi ngumu, kijiko 1 cha sukari au [place], [place] 4 za juisi ya [place], au wakia 6 za soda ya [place]. 3. Angalia tena sukari yako baada ya dakika 15. Lavonna Monarch  bado iko chini ya 70, fanya ulichofanya [place] ya 2 tena. Lavonna Monarch sukari yako ya damu imeongezeka, endelea na kula vitafunio au chakula kidogo kilichoundwa na wanga changamano (km. Nafaka nzima) na protini kwa wakati huu ili [place] kujirudia kwa sukari ya chini ya damu.  Mr. Speyer it was a pleasure seeing you today.   Please do the following:  Increase your Lantus to 24 units once daily and start taking Ozempic 0.25mg  one time a week on the same day each week. You will continue to take your Humalog 10 units BEFORE breakfast and BEFORE dinner as directed today during your appointment. If you have any questions or if you believe something has occurred because of this change, call me or your doctor to let one of [place] know.  Continue checking blood sugars at home. It's really important that you record these and bring these in to your next doctor's appointment.  Continue making the lifestyle changes we've discussed together during our visit. Diet and exercise play a significant role in improving your blood sugars.  Follow-up with [name] in one month   Hypoglycemia or low blood sugar:   Low blood sugar can happen quickly and may become an emergency if not treated right away.   While this shouldn't happen often, it can be brought upon if you skip a meal or do not eat enough. Also, if your insulin or other diabetes medications are dosed too high, this can cause your blood sugar to go to low.   Warning signs of low blood sugar include: Feeling shaky or dizzy Feeling weak or  tired  Excessive hunger Feeling anxious or upset  Sweating even when you aren't exercising  What to do if I experience low blood sugar? Follow the Rule of 15 Check your blood sugar with your meter. If lower than 70, proceed to step 2.  Treat with 15 grams of fast acting carbs which is found in 3-4 glucose tablets. If none are available you can try hard candy, 1 tablespoon of sugar or honey,4 ounces of fruit juice, or 6  ounces of REGULAR soda.  Re-check your sugar in 15 minutes. If it is still below 70, do what you did in step 2 again. If your blood sugar has come back up, go ahead and eat a snack or small meal made up of complex carbs (ex. Whole grains) and protein at this time to avoid recurrence of low blood sugar.

## 2021-05-03 NOTE — Progress Notes (Signed)
Subjective:    Patient ID: Randy Fisher, male    DOB: January 19, 1956, 66 y.o.   MRN: [id number]  HPI Patient is a 66 y.o. male who presents for diabetes management. He is in good spirits and presents without assistance. Utilized interpreter [phone]. Patient was referred and last seen by provider, Dr. Thompson Grayer, on 04/04/21.  Patient reports diabetes was diagnosed in 2017.   Insurance coverage/medication affordability: Medicaid  Current diabetes medications include: metformin 1000mg  twice daily, Lantus 20 units once daily, Humalog 10 units twice a day before breakfast and dinner Current hypertension medications include: lisinopril 2.5mg  once daily Current hyperlipidemia medications include: atorvastatin 40mg  once daily Patient states that He is taking his medications as prescribed. Patient reports adherence with medications.  Do you feel that your medications are working for you?  no  Have you been experiencing any side effects to the medications prescribed? no  Do you have any problems obtaining medications due to transportation or finances?  no     Patient reported dietary habits:  Eats 3 meals/day and 0 snacks/day; Boluses with 2 meals/day  Breakfast:fufu + beans Lunch: fish and potatoes or yams Dinner:fufu + fish Snacks: denies Drinks: water   Patient reports hypoglycemic events. Patient reports polyuria (increased urination).  Patient denies polyphagia (increased appetite).  Patient reports polydipsia (increased thirst).  Patient reports neuropathy (nerve pain). Patient reports visual changes. Patient reports self foot exams.   Home fasting blood sugars:   1-Feb 344  2-Feb 381  3-Feb 345  4-Feb 333  5-Feb 334  6-Feb 382  7-Feb 336  8-Feb 375  9-Feb 310  10-Feb 327  11-Feb 341  12-Feb 325  13-Feb 340  14-Feb 300  15-Feb 335    Objective:   Labs:   Physical Exam Neurological:     Mental Status: He is alert and oriented to person, place, and time.     ROS  Lab Results  Component Value Date   HGBA1C >15.0 04/04/2021   HGBA1C >15.0 12/22/2020   HGBA1C 9.4 (A) 03/08/2020    There were no vitals filed for this visit.  No results found for: [facility]  Lipid Panel     Component Value Date/Time   CHOL 142 12/22/2020 1648   TRIG 77 12/22/2020 1648   HDL 55 12/22/2020 1648   CHOLHDL 2.6 12/22/2020 1648   CHOLHDL 5.8 (H) 06/03/2015 1640   VLDL 34 (H) 06/03/2015 1640   LDLCALC 72 12/22/2020 1648    Clinical Atherosclerotic Cardiovascular Disease (ASCVD): No  The 10-year ASCVD risk score (Arnett DK, et al., 2019) is: 23.4%   Values used to calculate the score:     Age: 46 years     Sex: Male     Is Non-Hispanic African American: Yes     Diabetic: Yes     Tobacco smoker: No     Systolic Blood Pressure: [id number] mmHg     Is BP treated: Yes     HDL Cholesterol: 55 mg/dL     Total Cholesterol: 142 mg/dL    Assessment/Plan:   T2DM is not controlled likely due to sub-optimal lifestyle and patient taking bolus insulin Humalog after his meals rather than before. Medication adherence appears optimal. Additional pharmacotherapy is warranted. Will initiate Ozempic 0.25mg  once weekly. Will also increase patient's Lantus from 20 units to 24 units due to hyperglycemia. In the future can consider adding additional mealtime coverage during lunch but for now instructed patient to take Humalog BEFORE meals, not after. Will also work  towards titrating up Ozempic to max dose of 2mg  once weekly. Patient educated on purpose, proper use and potential adverse effects of Ozempic.  Following instruction patient verbalized understanding of treatment plan.    Increased dose of basal insulin Lantus to 24 units once daily Continued  rapid insulin Humalog 10 units twice a day with breakfast and dinner  Started GLP-1 Ozempic 0.25mg  once weekly  Continued metformin 1000mg  twice daily Extensively discussed pathophysiology of diabetes, dietary effects on  blood sugar control, and recommended lifestyle interventions Counseled on s/sx of and management of hypoglycemia Next A1C anticipated April 2023.   Follow-up appointment one month to review sugar readings. Written patient instructions provided.  This appointment required 45 minutes of direct patient care.  Thank you for involving pharmacy to assist in providing this patient's care.  Patient seen with Gala Murdoch, PharmD Candidate.

## 2021-05-05 ENCOUNTER — Ambulatory Visit: Payer: Medicaid Other | Admitting: Student

## 2021-05-05 ENCOUNTER — Other Ambulatory Visit: Payer: Self-pay | Admitting: Student

## 2021-05-05 DIAGNOSIS — E1169 Type 2 diabetes mellitus with other specified complication: Secondary | ICD-10-CM

## 2021-05-10 ENCOUNTER — Other Ambulatory Visit (HOSPITAL_COMMUNITY): Payer: Self-pay

## 2021-05-30 ENCOUNTER — Other Ambulatory Visit: Payer: Self-pay | Admitting: Family Medicine

## 2021-05-30 DIAGNOSIS — M545 Low back pain, unspecified: Secondary | ICD-10-CM

## 2021-06-02 ENCOUNTER — Ambulatory Visit: Payer: Medicaid Other | Admitting: Pharmacist

## 2021-06-06 ENCOUNTER — Encounter: Payer: Self-pay | Admitting: Pharmacist

## 2021-06-06 ENCOUNTER — Other Ambulatory Visit: Payer: Self-pay

## 2021-06-06 ENCOUNTER — Ambulatory Visit (INDEPENDENT_AMBULATORY_CARE_PROVIDER_SITE_OTHER): Payer: Medicare Other | Admitting: Pharmacist

## 2021-06-06 DIAGNOSIS — G8929 Other chronic pain: Secondary | ICD-10-CM | POA: Diagnosis not present

## 2021-06-06 DIAGNOSIS — Z794 Long term (current) use of insulin: Secondary | ICD-10-CM | POA: Diagnosis not present

## 2021-06-06 DIAGNOSIS — E1169 Type 2 diabetes mellitus with other specified complication: Secondary | ICD-10-CM | POA: Diagnosis not present

## 2021-06-06 DIAGNOSIS — M545 Low back pain, unspecified: Secondary | ICD-10-CM

## 2021-06-06 MED ORDER — OZEMPIC (0.25 OR 0.5 MG/DOSE) 2 MG/1.5ML ~~LOC~~ SOPN: 0.5000 mg | PEN_INJECTOR | SUBCUTANEOUS | 2 refills | Status: DC

## 2021-06-06 MED ORDER — INSULIN LISPRO (1 UNIT DIAL) 100 UNIT/ML (KWIKPEN)
15.0000 [IU] | PEN_INJECTOR | Freq: Two times a day (BID) | SUBCUTANEOUS | 11 refills | Status: DC
Start: 2021-06-06 — End: 2021-07-06

## 2021-06-06 MED ORDER — BACLOFEN 10 MG PO TABS: 10.0000 mg | ORAL_TABLET | Freq: Every day | ORAL | 0 refills | Status: DC

## 2021-06-06 NOTE — Progress Notes (Signed)
? ? ?S:    ? ?Chief Complaint  ?Patient presents with  ? Medication Management  ?  DM f/u  ? ? ?Randy Fisher is a 66 y.o. male who presents for diabetes evaluation, education, and management. PMH is significant for diabetes, HTN, hyperlipidemia. Patient was referred and last seen by Primary Care Provider, Dr. Miquel Dunn, on 04/04/21. Visit conducted with pt's son Dee-Dee as interpreter.  ? ?Pt reports his Baclofen helps and he ran out of this med yesterday. ?Pt wants to discuss options for food.  ?Pt reports that he feels the Ozempic is making the biggest difference for him with his medications. ? ?Today, He arrives in good spirits and presents without assistance.  ? ?Family/Social History: none reported ? ?Current diabetes medications include: Humalog (insulin lispro) 15 units BID, Lantus (insulin glargine) 22 units daily, metformin 1000 mg BID, Ozempic (semaglutide) 0.25 mg weekly ?Current hypertension medications include: lisinopril 2.5 mg daily ?Current hyperlipidemia medications include: atorvastatin 40 mg daily ? ?Patient states that He is taking his medications as prescribed. Patient reports adherence with medications. Patient states that He misses his medications 0 times per week, on average. ? ?Do you feel that your medications are working for you? Yes. Reports Ozempic is helping him. ?Have you been experiencing any side effects to the medications prescribed? no ?Do you have any problems obtaining medications due to transportation or finances? no ?Insurance coverage:  ? ?Patient denies hypoglycemic events. ? ?Reported home fasting blood sugars: Pt records all fasting blood sugars in notebook.  ?152, 200, 300, 150, 250, 280, 270. Most recordings are 200-300s.  ?Reported 2 hour post-meal/random blood sugars: Pt reports he does not take his blood sugar post-prandial and only checks first thing in the morning. ? ?Patient reports nocturia (nighttime urination) about once a week. ?Patient denies neuropathy (nerve  pain). ?Patient reports visual changes such as not being able to see far distances.  ?Patient reports self foot exams.  ? ?Patient reported dietary habits: Eats 2-3 meals/day ?Breakfast: tea and bread ?Lunch: banana, beans leaf ?Dinner: meat and fufu ?Snacks: none ?Drinks: water (less than before).  ? ?Within the past 12 months, did you worry whether your food would run out before you got money to buy more? no ? ?Patient-reported exercise habits: walking in the afternoon every day for about an hour ? ? ?O:  ?Physical Exam ?Constitutional:   ?   Appearance: Normal appearance. He is normal weight.  ?Pulmonary:  ?   Effort: Pulmonary effort is normal.  ?Neurological:  ?   Mental Status: He is alert.  ?Psychiatric:     ?   Mood and Affect: Mood normal.     ?   Behavior: Behavior normal.     ?   Thought Content: Thought content normal.     ?   Judgment: Judgment normal.  ? ? ?Review of Systems  ?Neurological:  Negative for sensory change.  ?All other systems reviewed and are negative. ? ? ?Lab Results  ?Component Value Date  ? HGBA1C >15.0 04/04/2021  ? ?There were no vitals filed for this visit. ? ?Lipid Panel  ?   ?Component Value Date/Time  ? CHOL 142 12/22/2020 1648  ? TRIG 77 12/22/2020 1648  ? HDL 55 12/22/2020 1648  ? CHOLHDL 2.6 12/22/2020 1648  ? CHOLHDL 5.8 (H) 06/03/2015 1640  ? VLDL 34 (H) 06/03/2015 1640  ? LDLCALC 72 12/22/2020 1648  ? ? ?Clinical Atherosclerotic Cardiovascular Disease (ASCVD): No  ?The 10-year ASCVD risk score (Arnett  DK, et al., 2019) is: 23.4% ?  Values used to calculate the score: ?    Age: 78 years ?    Sex: Male ?    Is Non-Hispanic African American: Yes ?    Diabetic: Yes ?    Tobacco smoker: No ?    Systolic Blood Pressure: 115 mmHg ?    Is BP treated: Yes ?    HDL Cholesterol: 55 mg/dL ?    Total Cholesterol: 142 mg/dL  ? ? ?A/P: ?Diabetes longstanding currently uncontrolled. Medication adherence appears optimal. Control is suboptimal due to not checking blood sugars in morning and  need for medication optimization. ?-Continued basal insulin Lantus (insulin glargine) at 22 units daily. ?-Continued rapid insulin Humalog (insulin lispro) at 15 units BID with meals.  ?-Increased dose of GLP-1 Ozempic (generic name semaglutide) to 0.5 mg weekly on Thursdays.  ?-Continued metformin 1000 mg BID with meals.  ?-Patient educated on purpose, proper use, and potential adverse effects of GI upset.  ?-Extensively discussed pathophysiology of diabetes, recommended lifestyle interventions, dietary effects on blood sugar control. Pt provided dietary resources to show different food options that are helpful for diabetics. Pt counseled to drink more water.  ?-Counseled on s/sx of and management of hypoglycemia.  ?-Next A1c anticipated 06/2021.  ? ?Hypertension longstanding currently controlled. Blood pressure goal of <130/80 mmHg. Medication adherence optimal.  ?-Continued lisinopril 2.5 mg daily. Pt counseled on importance to continue eating healthy and exercising.  ? ?Written patient instructions provided. Patient verbalized understanding of treatment plan. Total time in face to face counseling 47 minutes.   ? ?Follow up pharmacist in 3 weeks on 07/06/21.  Patient seen with Bartolo Darter PharmD Candidate and Lissa Merlin, PharmD, PGY 1 pharmacy resident.  ?. ? ?

## 2021-06-06 NOTE — Progress Notes (Signed)
Reviewed: I agree with Dr. Koval's documentation and management. 

## 2021-06-06 NOTE — Patient Instructions (Addendum)
Nice to see you today! ? ?Today we increased your Ozempic dose to 0.5 mg on Thursdays. ?We would like for you to start drinking more water and making sure that you are checking your blood sugar in the mornings.  ?We sent a new prescription for your Humalog as a pen, so you will not have to use the vial and needle anymore. ? ?Continue all other medications as directed. ?We will see you in 4 weeks to follow up. ? ?[name]! ? ?Leo tumeongeza dozi yako ya Ozempic hadi mg 0.5 siku ya [place]. ?Tungependa Domenic Moras maji zaidi na uhakikishe kuwa unakagua sukari yako ya damu asubuhi. ?Tulituma agizo jipya la Humalog yako kama kalamu, kwa hivyo hutalazimika kutumia chupa na sindano tena. ? ?Endelea na dawa zingine zote kama ilivyoagizwa. ?Tutakuona baada ya wiki 4 ili kufuatilia. ?

## 2021-07-06 ENCOUNTER — Ambulatory Visit (INDEPENDENT_AMBULATORY_CARE_PROVIDER_SITE_OTHER): Payer: Medicare Other | Admitting: Pharmacist

## 2021-07-06 ENCOUNTER — Encounter: Payer: Self-pay | Admitting: Pharmacist

## 2021-07-06 VITALS — BP 112/67 | HR 92 | Ht 66.0 in | Wt 144.4 lb

## 2021-07-06 DIAGNOSIS — E118 Type 2 diabetes mellitus with unspecified complications: Secondary | ICD-10-CM

## 2021-07-06 DIAGNOSIS — M545 Low back pain, unspecified: Secondary | ICD-10-CM | POA: Diagnosis not present

## 2021-07-06 DIAGNOSIS — Z794 Long term (current) use of insulin: Secondary | ICD-10-CM | POA: Diagnosis not present

## 2021-07-06 DIAGNOSIS — G8929 Other chronic pain: Secondary | ICD-10-CM

## 2021-07-06 DIAGNOSIS — E1169 Type 2 diabetes mellitus with other specified complication: Secondary | ICD-10-CM | POA: Diagnosis present

## 2021-07-06 LAB — POCT GLYCOSYLATED HEMOGLOBIN (HGB A1C): HbA1c POC (<> result, manual entry): >15 % (ref 4.0–5.6)

## 2021-07-06 MED ORDER — BACLOFEN 10 MG PO TABS: 10.0000 mg | ORAL_TABLET | Freq: Every day | ORAL | 0 refills | Status: DC

## 2021-07-06 MED ORDER — INSULIN LISPRO (1 UNIT DIAL) 100 UNIT/ML (KWIKPEN): 16.0000 [IU] | PEN_INJECTOR | Freq: Two times a day (BID) | SUBCUTANEOUS | 11 refills | Status: DC

## 2021-07-06 MED ORDER — GABAPENTIN 300 MG PO CAPS: ORAL_CAPSULE | ORAL | 3 refills | Status: DC

## 2021-07-06 NOTE — Patient Instructions (Addendum)
Nice to see you today.  ? ?Your blood sugar continues to be high. ? ?Start taking Lantus (insulin glargine) 24 units daily. Make sure to press the button and hold in your skin for 10 seconds. ?Start taking Humalog KwikPen (insulin lispro) 16 units with lunch and dinner. Make sure you start using the pen and not the vial and needle when your pen comes in. ?Start taking Ozempic 0.5 mg on Thursdays once a week. Make sure you are turning it to the 0.5 number. ? ?We would like to follow up with you in 2 weeks on Monday May 8 at 10:30 am.  ? ?Nimefurahi [employer]. ? ?Sukari ya damu yako inaendelea kuwa juu. ? ?Duffy Bruce Lantus (insulin glargine) vitengo 24 kila siku. Erven Colla unabonyeza kitufe na ushikilie kwenye ngozi yako kwa sekunde 10. ?Anza kuchukua Humalog KwikPen (insulin lispro) vitengo 16 na chakula cha mchana na cha jioni. Erven Colla unaanza kutumia kalamu na sio bakuli na sindano wakati kalamu yako inapoingia. ?Anza kuchukua Ozempic 0.5 mg siku ya [place] mara [employer]. [place]ak[place]isha unaigeuza kuwa nambari 0.5. ? ?Tungependa kufuatilia nawe baada ya wiki 2 siku ya Jumatatu tarehe 8 Mei saa 10:30 asubuhi. ?

## 2021-07-06 NOTE — Progress Notes (Signed)
Reviewed: I agree with Dr. Koval's documentation and management. 

## 2021-07-06 NOTE — Progress Notes (Signed)
? ? ?S:    ? ?Chief Complaint  ?Patient presents with  ? Medication Management  ?  Diabetes  ? ?Randy Fisher is a 66 y.o. male who presents for diabetes evaluation, education, and management. PMH is significant for diabetes. Patient was referred and last seen by Primary Care Provider, Dr. Miquel Dunn, on 04/04/21.  ?Today, patient arrives in good spirits and presents without assistance. This visit was conducted with patient's son Randy Fisher as interpreter.  ? ?Family/Social History: none reported ? ?Current diabetes medications include: Lantus (insulin glargine) 17 units daily (injecting ineffectively), Ozempic (semaglutide) 0.25 mg, Humalog (insulin lispro) 15 units BID with meals, metformin 1000 mg BID. Patient also brought in Humalog (insulin lispro) vial and needle rather than a Kwikpen.  ?Current hypertension medications include: lisinopril 2.5 mg daily ?Current hyperlipidemia medications include: atorvastatin 40 mg daily ? ?Patient reports taking all medications as prescribed. Patient reports adherence with medications. Patient reports missing his medications 0 times per week, on average.  ? ?Do you feel that your medications are working for you? unsure ?Have you been experiencing any side effects to the medications prescribed? no ? ?Patient denies hypoglycemic events. ? ?Reported home fasting blood sugars: 130-317  ?Reported 2 hour post-meal/random blood sugars: does not check blood sugar after eating. ? ?Patient reports nocturia (nighttime urination).  ?Patient denies neuropathy (nerve pain). ?Patient reports visual changes. He reports bluriness.  ?Patient reports self foot exams.  ? ?Patient reports having back pain to where he cannot get out of bed. He reports it is a constant pain.  ? ?Patient reported dietary habits: Eats 3 meals/day ?Breakfast: rice and beans ?Lunch: cabbage and potatoes  ?Dinner: chicken, fufu  ?Snacks: sometimes but cannot recall name, reports it does not have sugar ?Drinks: water but has not  increased intake since last visit ? ?A1c today is >15%.  ? ?Patient demonstrated technique with injecting Lantus (insulin glargine). It appears patient was not pressing the button on the pen. ? ? ?O:  ?Physical Exam ?Constitutional:   ?   Appearance: Normal appearance. He is normal weight.  ?Pulmonary:  ?   Effort: Pulmonary effort is normal.  ?Neurological:  ?   Mental Status: He is alert.  ?Psychiatric:     ?   Mood and Affect: Mood normal.     ?   Behavior: Behavior normal.     ?   Thought Content: Thought content normal.     ?   Judgment: Judgment normal.  ? ? ?Review of Systems  ?All other systems reviewed and are negative. ? ? ?Lab Results  ?Component Value Date  ? HGBA1C >15.0 07/06/2021  ? ?Vitals:  ? 07/06/21 0923  ?BP: 112/67  ?Pulse: 92  ?SpO2: 98%  ? ? ?Lipid Panel  ?   ?Component Value Date/Time  ? CHOL 142 12/22/2020 1648  ? TRIG 77 12/22/2020 1648  ? HDL 55 12/22/2020 1648  ? CHOLHDL 2.6 12/22/2020 1648  ? CHOLHDL 5.8 (H) 06/03/2015 1640  ? VLDL 34 (H) 06/03/2015 1640  ? LDLCALC 72 12/22/2020 1648  ? ? ?Clinical Atherosclerotic Cardiovascular Disease (ASCVD): No  ?The 10-year ASCVD risk score (Arnett DK, et al., 2019) is: 22.4% ?  Values used to calculate the score: ?    Age: 66 years ?    Sex: Male ?    Is Non-Hispanic African American: Yes ?    Diabetic: Yes ?    Tobacco smoker: No ?    Systolic Blood Pressure: 112 mmHg ?  Is BP treated: Yes ?    HDL Cholesterol: 55 mg/dL ?    Total Cholesterol: 142 mg/dL  ? ? ?A/P: ?Diabetes longstanding remains currently uncontrolled. Medication adherence appears sub-optimal. Control is suboptimal due to improper technique with medications, medication non-adherence, and non-adherence with checking blood sugar daily. ?-Continued basal insulin Lantus (insulin glargine) at 24 units daily. (Patient dose administration re-education provided).  ?-Increased dose of rapid insulin Humalog (insulin lispro) to 16 units BID with meals. Counseled patient on picking up  KwikPen from pharmacy and to stop using vial and needle.   ?-Continued GLP-1 Ozempic (generic name semaglutide) at 0.5 mg weekly.  ?-Continued metformin 1000 mg BID. ?-Patient educated on purpose, proper use, and potential adverse effects of insulin. Patient counseled on proper technique with the insulin pens and watched patient give Lantus (insulin glargine) dose in office.   ?-Extensively discussed pathophysiology of diabetes, recommended lifestyle interventions, dietary effects on blood sugar control.  ?-Counseled on s/sx of and management of hypoglycemia.  ?-Next A1c anticipated in 3 months (09/2021).  ? ?Hypertension longstanding currently controlled. BP today is 112/67. Blood pressure goal of <130/80 mmHg. Medication adherence optimal.  ?- Continued lisinopril 2.5 mg. ? ?Written patient instructions provided. Patient verbalized understanding of treatment plan. Total time in face to face counseling 56 minutes.   ? ?Follow up pharmacist in 2 weeks on May 8 at 10:30 am. Patient seen with Bartolo Darter PharmD Candidate.  ? ?

## 2021-07-06 NOTE — Assessment & Plan Note (Signed)
Diabetes longstanding remains currently uncontrolled. Medication adherence appears sub-optimal. Control is suboptimal due to improper technique with medications, medication non-adherence, and non-adherence with checking blood sugar daily. ?-Continued basal insulin Lantus (insulin glargine) at 24 units daily. (Patient dose administration re-education provided).  ?-Increased dose of rapid insulin Humalog (insulin lispro) to 16 units BID with meals. Counseled patient on picking up KwikPen from pharmacy and to stop using vial and needle.   ?-Continued GLP-1 Ozempic (generic name semaglutide) at 0.5 mg weekly.  ?-Continued metformin 1000 mg BID. ?

## 2021-07-19 ENCOUNTER — Other Ambulatory Visit: Payer: Self-pay

## 2021-07-19 MED ORDER — INSULIN LISPRO (1 UNIT DIAL) 100 UNIT/ML (KWIKPEN): 16.0000 [IU] | PEN_INJECTOR | Freq: Two times a day (BID) | SUBCUTANEOUS | 11 refills | Status: DC

## 2021-07-19 NOTE — Telephone Encounter (Signed)
-----   Message from Tonye Royalty, RN sent at 07/19/2021 10:40 AM EDT ----- ?Regarding: Humalog Kwikpen ?Good morning, ? ?[place] is requesting an order for [employer].Patient came to me requesting assistance to get the medication and I contacted pharmacy on his behalf. He is still using the vial and syringes/needles but prefers the [place]. He has run out of insulin syringes but I have given him a bunch as he waits for the pen. ? ?Dorothy. ? ?

## 2021-07-19 NOTE — Congregational Nurse Program (Signed)
?  Dept: [phone] ? ? ?Congregational Nurse Program Note ? ?Date of Encounter: 07/19/2021 ? ?Past Medical History: ?Past Medical History:  ?Diagnosis Date  ? Alcohol abuse   ? Diabetes mellitus without complication (HCC)   ? Hypertension   ? ? ?Encounter Details: ? CNP Questionnaire - 07/19/21 1052   ? ?  ? Questionnaire  ? Do you give verbal consent to treat you today? Yes   ? Location Patient Served  Not Applicable   ? Visit Setting Other   ? Patient Status Refugee   ? Insurance Medicaid   ? Insurance Referral N/A   ? Medication Provided Medication Assistance;Have Medication Insecurities   ? Medical Provider Yes   ? Screening Referrals N/A   ? Medical Referral N/A   ? Medical Appointment Made N/A   ? Food N/A   ? Transportation N/A   ? Housing/Utilities N/A   ? Interpersonal Safety N/A   ? Intervention Educate;Advocate;Navigate Healthcare System;Case Management;Spiritual Care;Support   ? ED Visit Averted N/A   ? Life-Saving Intervention Made N/A   ? ?  ?  ? ?  ? ?Patient came to me requesting assistance to get Humalog Stephanie Coup and I contacted pharmacy on his behalf. Pharmacy does not have the order. PCP contacted.He is still using the vial and syringes/needles but prefers the [employer]. He has run out of insulin syringes but I have given him a bunch as he waits for the pen.   ? ?Arman Bogus RN BSN PCCN  ?[facility] ?[phone]-cell ?[phone]-office ? ? ? ?

## 2021-07-19 NOTE — Telephone Encounter (Signed)
Contacted pharmacy.   ? ?They denied prescription sent to them 07/06/2021 as documented as "normal"  and sent with refills.  ? ?New prescription sent.  ?

## 2021-07-24 ENCOUNTER — Ambulatory Visit: Payer: Medicare Other | Admitting: Pharmacist

## 2021-07-27 ENCOUNTER — Ambulatory Visit (INDEPENDENT_AMBULATORY_CARE_PROVIDER_SITE_OTHER): Payer: Medicare Other | Admitting: Family Medicine

## 2021-07-27 VITALS — BP 131/80 | HR 88 | Wt 156.4 lb

## 2021-07-27 DIAGNOSIS — Z794 Long term (current) use of insulin: Secondary | ICD-10-CM | POA: Diagnosis not present

## 2021-07-27 DIAGNOSIS — H04123 Dry eye syndrome of bilateral lacrimal glands: Secondary | ICD-10-CM | POA: Diagnosis not present

## 2021-07-27 DIAGNOSIS — I10 Essential (primary) hypertension: Secondary | ICD-10-CM

## 2021-07-27 DIAGNOSIS — E1169 Type 2 diabetes mellitus with other specified complication: Secondary | ICD-10-CM | POA: Diagnosis present

## 2021-07-27 MED ORDER — OLOPATADINE HCL 0.2 % OP SOLN: 1.0000 [drp] | Freq: Every day | OPHTHALMIC | 1 refills | Status: DC | PRN

## 2021-07-27 NOTE — Progress Notes (Signed)
Asked to assist with medication review and clarification.  ? ?Patient taking Lantus insulin 24 units once daily. Reviewed that he can take this after his meal to prevent him from administering in a fasting state and then not eating.  ? ?Ozempic reports taking once weekly on Saturday 0.5mg .  ? ?Lisionpril 2.5mg  once daily ?Atorvastatin 40mg  once daily ?Metformin 1000mg  BID ?Gabapentin 300mg  TID ? ? ?Clarified that he did not have any mealtime insulin currently.  ?Consider reevaluation of blood sugars at next visit and need for prandial insulin in pen form.  ? ?1 box of #100 pen needles - 35mm  provided for patient use.  ? ?Sent 8 bottles of lantus insulin for destruction.  Patient will continue with Lantus pens.  ?

## 2021-07-27 NOTE — Progress Notes (Signed)
? ? ?  SUBJECTIVE:  ? ?CHIEF COMPLAINT / HPI:  ? ?Diabetes ?Current Regimen: Lantus 24 units, ozempic 0.5 mg weekly, possibly taking short acting (patient states he has another pen at home).  ?CBGs: 150-250 per log, checks 1-2 x daily ?Last A1c: >15.0 on 07/06/21  ?Denies polyuria, polydipsia, hypoglycemia ?Last Eye Exam: 2019; needs to schedule ?Statin: lipitor 40 mg ?ACE/ARB: lisinopril 2.5 mg qhs ? ?Hypertension: ?- Medications: Lisinopril 2.5 mg qhs ?- Compliance: Yes ?- Denies any SOB, CP, vision changes, LE edema, medication SEs, or symptoms of hypotension ? ?Desires eye drops for dry itchy eyes.  ? ?PERTINENT  PMH / PSH: As above.  ? ?OBJECTIVE:  ? ?BP 131/80   Pulse 88   Wt 156 lb 6.4 oz (70.9 kg)   SpO2 100%   BMI 25.24 kg/m?   ?Physical Exam ?Vitals reviewed.  ?Constitutional:   ?   General: He is not in acute distress. ?   Appearance: He is not ill-appearing, toxic-appearing or diaphoretic.  ?Cardiovascular:  ?   Rate and Rhythm: Normal rate and regular rhythm.  ?   Heart sounds: Normal heart sounds.  ?Pulmonary:  ?   Effort: Pulmonary effort is normal.  ?   Breath sounds: Normal breath sounds.  ?Musculoskeletal:  ?   Right lower leg: No edema.  ?   Left lower leg: No edema.  ?Neurological:  ?   Mental Status: He is alert and oriented to person, place, and time. Mental status is at baseline.  ?Psychiatric:     ?   Mood and Affect: Mood normal.     ?   Behavior: Behavior normal.  ? ?Diabetic Foot Exam - Simple   ?Simple Foot Form ?Visual Inspection ?No deformities, no ulcerations, no other skin breakdown bilaterally: Yes ?Sensation Testing ?Pulse Check ?Comments ?  ? ? ? ? ?ASSESSMENT/PLAN:  ? ?1. Type 2 diabetes mellitus with other specified complication, with long-term current use of insulin (HCC) ?Uncontrolled. Was not properly injecting insulin. Teaching performed by [name]. Plan to bring in other pen to follow up with pharm next week; probable short acting insulin. .  ? ?2. Primary  hypertension ?Well controlled. Continue current medications.  ? ?3. Dry eyes ?- Olopatadine HCl 0.2 % SOLN; Apply 1 drop to eye daily as needed (for dry irritated eyes).  Dispense: 2.5 mL; Refill: 1 ? ? ?Earla Charlie Autry-Lott, DO ?[facility]  ?

## 2021-07-27 NOTE — Patient Instructions (Addendum)
It was wonderful to see you today. ? ?Please bring ALL of your medications with you to every visit.  ? ?Today we talked about: ? ?Your diabetes. We will not make any medication changes today. You met with doctor Koval and discussed how to properly administer your medication. Follow up with your PCP in 1 week to discuss other medications and possible physical. Follow these directions and follow up with him at the end of July.  ? ?Please be sure to schedule follow up at the front  desk before you leave today.  ? ?If you haven't already, sign up for My Chart to have easy access to your labs results, and communication with your primary care physician. ? ?Please call the clinic at [phone] if your symptoms worsen or you have any concerns. It was our pleasure to serve you. ? ?Dr. Janus Molder ? ?

## 2021-08-01 ENCOUNTER — Other Ambulatory Visit (HOSPITAL_COMMUNITY): Payer: Self-pay

## 2021-08-01 ENCOUNTER — Telehealth: Payer: Self-pay

## 2021-08-01 NOTE — Telephone Encounter (Signed)
A Prior Authorization was initiated for this patients OZEMPIC through CoverMyMeds.  ? ?Key: [id number] ?

## 2021-08-02 ENCOUNTER — Telehealth: Payer: Self-pay

## 2021-08-02 ENCOUNTER — Other Ambulatory Visit (HOSPITAL_COMMUNITY): Payer: Self-pay

## 2021-08-02 NOTE — Telephone Encounter (Signed)
Created in error

## 2021-08-04 ENCOUNTER — Ambulatory Visit (INDEPENDENT_AMBULATORY_CARE_PROVIDER_SITE_OTHER): Payer: Medicare Other | Admitting: Student

## 2021-08-04 ENCOUNTER — Encounter: Payer: Self-pay | Admitting: Student

## 2021-08-04 VITALS — BP 117/87 | HR 83 | Ht 66.0 in | Wt 155.8 lb

## 2021-08-04 DIAGNOSIS — M79641 Pain in right hand: Secondary | ICD-10-CM

## 2021-08-04 DIAGNOSIS — M791 Myalgia, unspecified site: Secondary | ICD-10-CM | POA: Diagnosis present

## 2021-08-04 MED ORDER — IBUPROFEN 200 MG PO TABS: 400.0000 mg | ORAL_TABLET | Freq: Four times a day (QID) | ORAL | 0 refills | Status: DC | PRN

## 2021-08-04 NOTE — Progress Notes (Signed)
  SUBJECTIVE:   CHIEF COMPLAINT / HPI:   Wrist Pain:  Nothing happened to wrist it just started hurting. Issues grabbing/holding things. Pain located around wrist, issues falling asleep from the pain. Pain started 2 days ago, happen when he woke up. Has been an issue since. Describes the pain as shooting pain, that is going through his wrist. Also appreciates a burning sensation. Pain located only on dorsal surface of hand/wrist.   PERTINENT  PMH / PSH: DM, HTN   OBJECTIVE:  BP 117/87   Pulse 83   Ht 5\' 6"  (1.676 m)   Wt 155 lb 12.8 oz (70.7 kg)   SpO2 99%   BMI 25.15 kg/m   General: NAD, pleasant, able to participate in exam MSK: Right hand/wrist with tenderness overlying middle digit tendon on palmar and dorsal surface. Hypothenar/Thenar eminence normal, hand musculatures normal/symmetric, ROM limited by pain, no overlying redness/inflammation/warmth  ASSESSMENT/PLAN:  No problem-specific Assessment & Plan notes found for this encounter.   Right/Wrist pain/Tendonopathy: Patient appears to be suffering from some form of tendonopathy. Tendon of middle digit is TTP, and limiting ROM at wrist 2/2 pain. Patient states problem is acute, starting is last 2 days.  -Wrist splinting -Ice/Heat for 10-20 min at time, x 3 a day -400 mg Ibuprofen prn  No orders of the defined types were placed in this encounter.  Meds ordered this encounter  Medications   ibuprofen (ADVIL) 200 MG tablet    Sig: Take 2 tablets (400 mg total) by mouth every 6 (six) hours as needed.    Dispense:  30 tablet    Refill:  0   No follow-ups on file. @SIGNNOTE @

## 2021-08-04 NOTE — Patient Instructions (Signed)
It was great to see you! Thank you for allowing me to participate in your care!  We think you are having an issue with the tendon in your hand/wrist.   Our plans for today:  - Tendonopathy  - Ice or heat for 10-20 min at a time, then take a break. No more than 3 times a day - Rest wrist by using wrist brace. Wear during active part of day, do not sleep in wrist brace. Remove wrist brace throughout day to stretch/flex wrist - Ibuprofen: 400 mg every 6 hours as needed for pain  - Follow up in 2 weeks if not better  Take care and seek immediate care sooner if you develop any concerns.   Dr. Bess Kinds, MD [facility]

## 2021-08-07 ENCOUNTER — Other Ambulatory Visit (HOSPITAL_COMMUNITY): Payer: Self-pay

## 2021-08-18 ENCOUNTER — Ambulatory Visit: Payer: Medicare Other | Admitting: Student

## 2021-08-18 NOTE — Progress Notes (Deleted)
  SUBJECTIVE:   CHIEF COMPLAINT / HPI:   F/u tendinopathy  F/u DM DM Regimen:  Metformin 1000 mg BID Ozempic 0.5 mg weekly Humalog Lispro 16 units BID w/ meals Lantus 24 units daily CBG range: Sympt: Hypoglycemia Needs Eye Exam  PERTINENT  PMH / PSH: DM, HTN   OBJECTIVE:  There were no vitals taken for this visit.  General: NAD, pleasant, able to participate in exam Cardiac: RRR, no murmurs auscultated. Respiratory: CTAB, normal effort, no wheezes, rales or rhonchi Abdomen: soft, non-tender, non-distended, normoactive bowel sounds Extremities: warm and well perfused, no edema or cyanosis. Skin: warm and dry, no rashes noted Neuro: alert, no obvious focal deficits, speech normal Psych: Normal affect and mood  ASSESSMENT/PLAN:  No problem-specific Assessment & Plan notes found for this encounter.   No orders of the defined types were placed in this encounter.  No orders of the defined types were placed in this encounter.  No follow-ups on file. @SIGNNOTE @ {    This will disappear when note is signed, click to select method of visit    :1}

## 2021-08-18 NOTE — Patient Instructions (Incomplete)
It was great to see you! Thank you for allowing me to participate in your care!  I recommend that you always bring your medications to each appointment as this makes it easy to ensure we are on the correct medications and helps us not miss when refills are needed.  Our plans for today:  - *** -   We are checking some labs today, I will call you if they are abnormal will send you a MyChart message or a letter if they are normal.  If you do not hear about your labs in the next 2 weeks please let us know.***  Take care and seek immediate care sooner if you develop any concerns.   Dr. Aneita Kiger, MD [facility]  

## 2021-08-25 ENCOUNTER — Other Ambulatory Visit: Payer: Self-pay | Admitting: Family Medicine

## 2021-08-25 ENCOUNTER — Other Ambulatory Visit: Payer: Self-pay | Admitting: Student

## 2021-08-25 DIAGNOSIS — E1169 Type 2 diabetes mellitus with other specified complication: Secondary | ICD-10-CM

## 2021-08-25 DIAGNOSIS — H04123 Dry eye syndrome of bilateral lacrimal glands: Secondary | ICD-10-CM

## 2021-08-29 ENCOUNTER — Other Ambulatory Visit (HOSPITAL_COMMUNITY): Payer: Self-pay

## 2021-10-13 ENCOUNTER — Telehealth: Payer: Self-pay

## 2021-10-13 ENCOUNTER — Other Ambulatory Visit (HOSPITAL_COMMUNITY): Payer: Self-pay

## 2021-10-13 MED ORDER — NOVOLOG FLEXPEN 100 UNIT/ML ~~LOC~~ SOPN
16.0000 [IU] | PEN_INJECTOR | Freq: Two times a day (BID) | SUBCUTANEOUS | 11 refills | Status: DC
Start: 2021-10-13 — End: 2022-04-30

## 2021-10-13 NOTE — Telephone Encounter (Signed)
New prescription for rapid acting insulin covered by pharmacy formulary.   Humalog denied.   New prescription - same dose prescribed and sent to pharmacy.  Asked Pharmacy to clarify exchange with patient as "equivalent".

## 2021-10-13 NOTE — Telephone Encounter (Signed)
A Prior Authorization was initiated for this patients HUMALOG KWIKPEN through CoverMyMeds.   Key: [id number]

## 2021-10-13 NOTE — Addendum Note (Signed)
Addended by: Kathrin Ruddy on: 10/13/2021 04:25 PM   Modules accepted: Orders

## 2021-10-13 NOTE — Telephone Encounter (Signed)
Prior Auth for patients medication HUMALOG KWIKPEN denied by [employer] via CoverMyMeds.   Reason: information received from your prescriber did not confirm that at least one of the other drugs available on the formulary to treat the same diagnosis or condition have been tried and according to the prescriber, the patient experienced inadequate efficacy OR significant intolerance. Examples of other drug(s) on your Plan's formulary include FIASP FLEXTOUCH, FIASP, NOVOLOG PENFILL, NOVOLOG FLEXPEN, NOVOLOG MIX 70-30 FLEX. The drug requested is not on your Plan's formulary   PT USED NOVOLOG IN PAST BUT COULDN'T FIND DOCUMENTATION OF MEDICATION GIVING INADEQUATE EFFICIENCY. APPEAL AVAILABLE  CoverMyMeds Key: [id number]

## 2021-10-16 NOTE — Telephone Encounter (Signed)
Noted and agree. 

## 2021-10-17 ENCOUNTER — Other Ambulatory Visit: Payer: Self-pay | Admitting: Student

## 2021-10-17 DIAGNOSIS — I10 Essential (primary) hypertension: Secondary | ICD-10-CM

## 2021-10-20 ENCOUNTER — Telehealth: Payer: Self-pay

## 2021-10-20 ENCOUNTER — Other Ambulatory Visit (HOSPITAL_COMMUNITY): Payer: Self-pay

## 2021-10-20 NOTE — Telephone Encounter (Signed)
Prior Auth for patients medication OZEMPIC approved by [employer] thru 10/20/22.  Key: Randy Fisher

## 2021-10-20 NOTE — Telephone Encounter (Signed)
A Prior Authorization was initiated for this patients OZEMPIC through CoverMyMeds.   Key: Gasper Lloyd

## 2021-11-15 ENCOUNTER — Other Ambulatory Visit: Payer: Self-pay | Admitting: Student

## 2021-11-15 DIAGNOSIS — H04123 Dry eye syndrome of bilateral lacrimal glands: Secondary | ICD-10-CM

## 2021-11-15 DIAGNOSIS — E785 Hyperlipidemia, unspecified: Secondary | ICD-10-CM

## 2021-11-30 ENCOUNTER — Other Ambulatory Visit: Payer: Self-pay | Admitting: Student

## 2021-11-30 DIAGNOSIS — K5900 Constipation, unspecified: Secondary | ICD-10-CM

## 2021-12-12 ENCOUNTER — Other Ambulatory Visit: Payer: Self-pay | Admitting: Family Medicine

## 2021-12-12 DIAGNOSIS — G8929 Other chronic pain: Secondary | ICD-10-CM

## 2021-12-29 ENCOUNTER — Other Ambulatory Visit: Payer: Self-pay | Admitting: Student

## 2021-12-29 DIAGNOSIS — E118 Type 2 diabetes mellitus with unspecified complications: Secondary | ICD-10-CM

## 2022-01-17 DIAGNOSIS — R7309 Other abnormal glucose: Secondary | ICD-10-CM

## 2022-01-17 LAB — GLUCOSE, POCT (MANUAL RESULT ENTRY): POC Glucose: 479 mg/dl — AB (ref 70–99)

## 2022-01-17 NOTE — Congregational Nurse Program (Signed)
  Dept: [phone]   Congregational Nurse Program Note  Date of Encounter: 01/17/2022  Past Medical History: Past Medical History:  Diagnosis Date   Alcohol abuse    Diabetes mellitus without complication ([place])    Hypertension     Encounter Details:  CNP Questionnaire - 01/17/22 1247       Questionnaire   Ask client: Do you give verbal consent for me to treat you today? Yes    Student Assistance [employer] Nurse    Location Patient Served  [place]    Visit Setting with Client [place]    Patient Status Refugee    Insurance [facility]    Insurance/Financial Assistance Referral N/A    Medication Have Medication Insecurities    Medical Provider Yes    Screening Referrals Made N/A    Medical Referrals Made [facility]    Medical Appointment Made [facility] PCP[facility]    Recently w/o PCP, now 1st time PCP visit completed due to CNs referral or appointment made N/A    Food N/A    Transportation N/A    Housing/Utilities N/A    Interpersonal Safety N/A    Interventions N/A    Abnormal to Normal Screening Since Last CN Visit Blood Glucose    Screenings CN Performed Blood Pressure;Blood Glucose    Sent Client to Lab for: N/A    Did client attend any of the following based off CNs referral or appointments made? N/A    ED Visit Averted Yes    Life-Saving Intervention Made N/A            Patient came requesting assistance to schedule a follow up appointment. Blood glucose and blood pressure taken during visit with congregational RN. Elevated blood glucose of 479, He has not taken his insulin today and reports eating "wrong diet". Will call and schedule follow up with PCP.  [name] Bradleigh Sonnen RN BSN [place] Nurse [phone]

## 2022-01-18 ENCOUNTER — Other Ambulatory Visit: Payer: Self-pay | Admitting: Family Medicine

## 2022-01-18 DIAGNOSIS — M545 Low back pain, unspecified: Secondary | ICD-10-CM

## 2022-01-19 ENCOUNTER — Other Ambulatory Visit: Payer: Self-pay | Admitting: Student

## 2022-01-19 DIAGNOSIS — E1169 Type 2 diabetes mellitus with other specified complication: Secondary | ICD-10-CM

## 2022-01-22 ENCOUNTER — Other Ambulatory Visit: Payer: Self-pay | Admitting: Pharmacist

## 2022-01-23 ENCOUNTER — Telehealth: Payer: Self-pay

## 2022-01-23 NOTE — Telephone Encounter (Signed)
Patient requested appointment for a follow-up appointment with PCP on 01/29/2022 at 1:50pm. Patient was made aware of date and time.   [name] Miri Jose RN BSN [place] Nurse [phone]

## 2022-01-24 ENCOUNTER — Ambulatory Visit: Payer: Medicare Other

## 2022-01-24 DIAGNOSIS — Z23 Encounter for immunization: Secondary | ICD-10-CM

## 2022-01-29 ENCOUNTER — Ambulatory Visit (INDEPENDENT_AMBULATORY_CARE_PROVIDER_SITE_OTHER): Payer: Medicare Other | Admitting: Student

## 2022-01-29 ENCOUNTER — Encounter: Payer: Self-pay | Admitting: Student

## 2022-01-29 VITALS — BP 104/58 | HR 82 | Ht 66.0 in | Wt 160.4 lb

## 2022-01-29 DIAGNOSIS — E1169 Type 2 diabetes mellitus with other specified complication: Secondary | ICD-10-CM

## 2022-01-29 DIAGNOSIS — Z794 Long term (current) use of insulin: Secondary | ICD-10-CM | POA: Diagnosis not present

## 2022-01-29 LAB — POCT GLYCOSYLATED HEMOGLOBIN (HGB A1C): HbA1c, POC (controlled diabetic range): 13.2 % — AB (ref 0.0–7.0)

## 2022-01-29 NOTE — Patient Instructions (Addendum)
It was great to see you! Thank you for allowing me to participate in your care!  I recommend that you always bring your medications to each appointment as this makes it easy to ensure we are on the correct medications and helps [place] not miss when refills are needed.  Our plans for today:  -Continue Ozempic (generic name semaglutide) at 0.5 mg weekly.  -Continue Lantus (insulin glargine) at 24 units daily.   -Continue Humalog (insulin lispro) to 16 units twice a day with meals  -Continue Metformin 1000 mg twice a day (morning and evening)  Make a follow up appointment in 1 months to check medications.   Take care and seek immediate care sooner if you develop any concerns.   Dr. Bess Kinds, MD [facility]   In swahili  [place] nzuri [place] kwa kuniruhusu kushiriki Jonetta Speak wako!  Berneta Sages dawa zako kila mara kwa kila [place] Henriette Combs kuwa tunatumia dawa sahihi na hutusaidia tusikose wakati kujaza kunapohitajika.  Mip[place] yetu ya leo: -[place] Ozempic (jina la kawaida semaglutide) kwa miligramu 0.5 kila wiki. [name] Lantus (insulin glargine) [place] vitengo 24 kila siku. -[place]le[place] na Humalog (insulin lispro) hadi uniti 16 mara mbili kwa siku ukiwa na milo -[place] Metformin 1000 mg mara mbili kwa siku (asubuhi na jioni)  Fanya miadi ya kufuatilia ndani ya mwezi 1 ili kuangalia dawa.  Jihadharini na utafute huduma ya haraka mapema ikiwa unapata wasiwasi wowote.  Dk. Bess Kinds, MD [facility]

## 2022-01-29 NOTE — Progress Notes (Signed)
  SUBJECTIVE:   CHIEF COMPLAINT / HPI:   F/u DM: Current Regimen: Lantus 24 units, ozempic 0.5 mg weekly, Humalog 16 u w/ his 2 meals. Metformin 1000 mg BID,  Reports: using metformin once a day, and not using ozempic, using the humalog 16 units in the evening once a day, 24 units lantus daily.   CBGs: ranges 125-130's, and getting as high as 250.  Last A1c: >15.0 on 07/06/21, and is 13.2 today.  Last Eye Exam: 2019; needs to schedule Statin: lipitor 40 mg ACE/ARB: lisinopril 2.5 mg qhs  Bills: Patient noting he has medical bills from previous visit that it doesn't look like his insurance covered. Patient needing help in sorting this out.   PERTINENT  PMH / PSH:  DM    OBJECTIVE:  BP (!) 104/58   Pulse 82   Ht 5\' 6"  (1.676 m)   Wt 160 lb 6.4 oz (72.8 kg)   SpO2 98%   BMI 25.89 kg/m  Physical Exam Constitutional:      General: He is not in acute distress.    Appearance: Normal appearance.  Neurological:     Mental Status: He is alert.      ASSESSMENT/PLAN:  Type 2 diabetes mellitus with other specified complication, with long-term current use of insulin (HCC) Assessment & Plan: Patient not following DM regimen well. Patient not taking Ozempic, using humalog and metformin once a day, but was taking appropriate amount of lantus. Reviewed DM regimen with patient and put specific instructions in the note. Will have patient f/u in 1 month to double check DM regimen. Patient also needing eyes checked, will place a referral to Optho. -Start Ozempic (generic name semaglutide) at 0.5 mg weekly.  -Continue Lantus (insulin glargine) at 24 units daily.   -Continue Humalog (insulin lispro) to 16 units twice a day with meals  -Continue Metformin 1000 mg twice a day (morning and evening) -Urine Microalbumin -Amb Ref to Optho -F/u 1 month  Orders: -     POCT glycosylated hemoglobin (Hb A1C) -     Ambulatory referral to Ophthalmology -     Microalbumin, urine;  Future   Bills: Patient comes to clinic with bills from previous appointments, not covered by his insurance. Patient is looking for help in managing this. Will have patient f/u in refugee clinic for next DM visit, and to see about getting help with bills.   No follow-ups on file. , MD 02/02/2022, 7:01 PM PGY-2, [facility]

## 2022-01-30 ENCOUNTER — Other Ambulatory Visit: Payer: Self-pay

## 2022-01-30 DIAGNOSIS — R7309 Other abnormal glucose: Secondary | ICD-10-CM

## 2022-01-30 LAB — GLUCOSE, POCT (MANUAL RESULT ENTRY): POC Glucose: 424 mg/dl — AB (ref 70–99)

## 2022-01-30 NOTE — Congregational Nurse Program (Signed)
  Dept: [phone]   Congregational Nurse Program Note  Date of Encounter: 01/30/2022  Past Medical History: Past Medical History:  Diagnosis Date   Alcohol abuse    Diabetes mellitus without complication (HCC)    Hypertension     Encounter Details:  CNP Questionnaire - 01/30/22 1153       Questionnaire   Ask client: Do you give verbal consent for me to treat you today? Yes    Student Assistance [employer]    [place] Patient Served  [facility]    Visit Setting with Client [facility]    Patient Status Refugee    Insurance [place]    Insurance/Financial Assistance Referral N/A    Medication Have Medication Insecurities;Provided Medication Assistance    Medical Provider Yes    Screening Referrals Made N/A    Medical Referrals Made [facility]    Medical Appointment Made [facility]    Recently w/o PCP, now 1st time PCP visit completed due to CNs referral or appointment made N/A    Food N/A    Transportation N/A    Housing/Utilities N/A    Interpersonal Safety N/A    Interventions N/A    Abnormal to Normal Screening Since Last CN Visit Blood Glucose    Screenings CN Performed Blood Glucose    Sent Client to Lab for: N/A    Did client attend any of the following based off CNs referral or appointments made? N/A    ED Visit Averted Yes    Life-Saving Intervention Made N/A            Patient came in for follow up. Recently see by PCP. Blood sugar is elevated again today. I have reviewed his medication regimen with him. He promises to follow instructions and to be compliant with medication. Follow up appointment set up in a month.  Nicole Cella Tylan Briguglio RN BSN PCCN  [facility] [phone]-office

## 2022-01-31 ENCOUNTER — Other Ambulatory Visit: Payer: Self-pay

## 2022-01-31 DIAGNOSIS — R739 Hyperglycemia, unspecified: Secondary | ICD-10-CM

## 2022-01-31 DIAGNOSIS — R7309 Other abnormal glucose: Secondary | ICD-10-CM

## 2022-01-31 LAB — GLUCOSE, POCT (MANUAL RESULT ENTRY): POC Glucose: 213 mg/dl — AB (ref 70–99)

## 2022-01-31 NOTE — Congregational Nurse Program (Signed)
  Dept: [phone]   Congregational Nurse Program Note  Date of Encounter: 01/31/2022  Past Medical History: Past Medical History:  Diagnosis Date   Alcohol abuse    Diabetes mellitus without complication (HCC)    Hypertension     Encounter Details:  CNP Questionnaire - 01/31/22 1303       Questionnaire   Ask client: Do you give verbal consent for me to treat you today? Yes    Student Assistance N/A    Location Patient Served  [facility]    Visit Setting with Client [facility]    Patient Status Refugee    Insurance Medicaid    Insurance/Financial Assistance Referral N/A    Medication Have Medication Insecurities;Provided Medication Assistance    Medical Provider Yes    Screening Referrals Made N/A    Medical Referrals Made [facility]    Medical Appointment Made [facility]    Recently w/o PCP, now 1st time PCP visit completed due to CNs referral or appointment made N/A    Food N/A    Transportation N/A    Housing/Utilities N/A    Interpersonal Safety N/A    Interventions N/A    Abnormal to Normal Screening Since Last CN Visit Blood Glucose    Screenings CN Performed Blood Glucose    Sent Client to Lab for: N/A    Did client attend any of the following based off CNs referral or appointments made? N/A    ED Visit Averted Yes    Life-Saving Intervention Made N/A           Patient came in for blood sugar check. Today CBG 213 compared to 424 yesterday. He took his medication correctly yesterday and today. Encouraged to keep it up. Will continue to monitor.  Nicole Cella Dorethea Strubel RN BSN PCCN  [facility] [phone]-office

## 2022-01-31 NOTE — Progress Notes (Signed)
This encounter was created in error - please disregard.

## 2022-02-02 NOTE — Assessment & Plan Note (Addendum)
Patient A1c 13.2 down from > 15 at last check. Patient not following DM regimen well. Patient not taking Ozempic, using humalog and metformin once a day, but was taking appropriate amount of lantus. Reviewed DM regimen with patient and put specific instructions in the note. Will have patient f/u in 1 month to double check DM regimen. Patient also needing eyes checked, will place a referral to [facility]. -Start Ozempic (generic name semaglutide) at 0.5 mg weekly.  -Continue Lantus (insulin glargine) at 24 units daily.   -Continue Humalog (insulin lispro) to 16 units twice a day with meals  -Continue Metformin 1000 mg twice a day (morning and evening) -Urine Microalbumin -Amb Ref to [facility] -F/u 1 month

## 2022-02-19 ENCOUNTER — Other Ambulatory Visit: Payer: Self-pay | Admitting: Student

## 2022-02-19 DIAGNOSIS — G8929 Other chronic pain: Secondary | ICD-10-CM

## 2022-02-28 ENCOUNTER — Ambulatory Visit: Payer: Medicare Other | Admitting: Student

## 2022-02-28 NOTE — Progress Notes (Deleted)
  SUBJECTIVE:   CHIEF COMPLAINT / HPI:   F/u DM management Last seen 1 month ago and noted to not be following DM regimen. F/u today to review regimen. Patient had elevated CBG to 200's and 400's two days after visit. Lab Results  Component Value Date   HGBA1C 13.2 (A) 01/29/2022  Regimen: -Ozempic (generic name semaglutide) at 0.5 mg weekly.  -Continue Lantus (insulin glargine) at 24 units daily.   -Continue Humalog (insulin lispro) to 16 units twice a day with meals  -Continue Metformin 1000 mg twice a day (morning and evening) Meds: Lisinopril 2.5 mg, Atorvastatin 40 mg Compliance: Urine: Eye: Ref placed  PERTINENT  PMH / PSH: HTN, DM, HLD    OBJECTIVE:  There were no vitals taken for this visit. Physical Exam   ASSESSMENT/PLAN:  There are no diagnoses linked to this encounter. No follow-ups on file. Bess Kinds, MD 02/28/2022, 7:21 AM PGY-***, [facility] {    This will disappear when note is signed, click to select method of visit    :1}

## 2022-03-01 ENCOUNTER — Telehealth: Payer: Self-pay | Admitting: Student

## 2022-03-01 NOTE — Telephone Encounter (Signed)
Called patient about them missing their appointment yesterday and attempting to schedule him for an appointment next Tuesday at 4 pm.

## 2022-03-06 ENCOUNTER — Ambulatory Visit: Payer: Medicare Other

## 2022-03-06 NOTE — Progress Notes (Deleted)
  SUBJECTIVE:   CHIEF COMPLAINT / HPI:   Diabetes F/u F/u to see if patient is on correct DM regimen. Regimen:  -Ozempic at 0.5 mg weekly.  -Lantus at 24 units daily.   -Humalog to 16 units twice a day with meals  -Metformin 1000 mg twice a day (morning and evening) -Urine Microalbumin  PERTINENT  PMH / PSH: diabetes, HTN   OBJECTIVE:  There were no vitals taken for this visit. Physical Exam   ASSESSMENT/PLAN:  There are no diagnoses linked to this encounter. No follow-ups on file. Bess Kinds, MD 03/06/2022, 6:44 AM PGY-***, [facility] {    This will disappear when note is signed, click to select method of visit    :1}

## 2022-03-06 NOTE — Patient Instructions (Incomplete)
It was great to see you! Thank you for allowing me to participate in your care!  I recommend that you always bring your medications to each appointment as this makes it easy to ensure we are on the correct medications and helps us not miss when refills are needed.  Our plans for today:  - *** -   We are checking some labs today, I will call you if they are abnormal will send you a MyChart message or a letter if they are normal.  If you do not hear about your labs in the next 2 weeks please let us know.***  Take care and seek immediate care sooner if you develop any concerns.   Dr. Minnie Legros, MD [facility]  

## 2022-03-30 ENCOUNTER — Other Ambulatory Visit: Payer: Self-pay | Admitting: Student

## 2022-03-30 DIAGNOSIS — G8929 Other chronic pain: Secondary | ICD-10-CM

## 2022-04-11 ENCOUNTER — Other Ambulatory Visit: Payer: Self-pay | Admitting: Student

## 2022-04-11 DIAGNOSIS — Z794 Long term (current) use of insulin: Secondary | ICD-10-CM

## 2022-04-14 ENCOUNTER — Other Ambulatory Visit: Payer: Self-pay | Admitting: Student

## 2022-04-14 ENCOUNTER — Telehealth: Payer: Self-pay

## 2022-04-14 DIAGNOSIS — G8929 Other chronic pain: Secondary | ICD-10-CM

## 2022-04-14 NOTE — Telephone Encounter (Signed)
Attempted to call patient for missed appointment and rescheduling. No answer. [employer] voice message left in [place]. I will do a home visit Monday if no call back.  [name] Sherrika Weakland RN BSN [place] [facility] [phone]

## 2022-04-17 ENCOUNTER — Telehealth: Payer: Self-pay | Admitting: Family Medicine

## 2022-04-17 NOTE — Telephone Encounter (Signed)
Congregational RN noticed patient no-showed his recent appointment for diabetes management. PCP unable to reach patient to reschedule.   Dorothy Muhoro, RN, requests that we reschedule for next week so she can have ample time to notify patient and arrange transportation.   Patient rescheduled for Monday 2/05 at 3:10 pm with Dr. Nita Sells (cc'd on this note). Interpreting services emailed to request in-person Swahili interpreter.   Ezequiel Essex, MD

## 2022-04-17 NOTE — Congregational Nurse Program (Signed)
  Dept: [phone]   Congregational Nurse Program Note  Date of Encounter: 04/17/2022  Past Medical History: Past Medical History:  Diagnosis Date   Alcohol abuse    Diabetes mellitus without complication ([place])    Hypertension     Encounter Details:  CNP Questionnaire - 04/17/22 1400       Questionnaire   Ask client: Do you give verbal consent for me to treat you today? Yes    Student Assistance N/A    Location Patient Served  [facility]    Visit Setting with Client Home    Patient Status Refugee    Insurance Medicaid    Insurance/Financial Assistance Referral N/A    Medication Have Medication Insecurities;Provided Medication Assistance    Medical Provider Yes    Screening Referrals Made N/A    Medical Referrals [facility]    Medical Appointment Made [facility]    Recently w/o PCP, now 1st time PCP visit completed due to CNs referral or appointment made N/A    Food N/A    Transportation N/A    Housing/Utilities N/A    Interpersonal Safety N/A    Interventions N/A    Abnormal to Normal Screening Since Last CN Visit Blood Glucose    Screenings CN Performed Blood Glucose    Sent Client to Lab for: N/A    Did client attend any of the following based off CNs referral or appointments made? N/A    ED Visit Averted Yes    Life-Saving Intervention Made N/A           Patient missed follow up appointment with pcp. Several attempts to call patient on cell phone unsuccessful.[place] calls failed as well.  Home visit done to check on patient welfare.Patient at home and recovering from teeth extractions.He has not been eating well.Educated to monitor his sugar closely during this time of recovery.   Informed of the rescheduled appointment and agrees to the date and time.  [name] Markeita Alicia RN BSN [place] Nurse [phone]

## 2022-04-20 ENCOUNTER — Other Ambulatory Visit (HOSPITAL_COMMUNITY): Payer: Self-pay

## 2022-04-23 ENCOUNTER — Encounter: Payer: Self-pay | Admitting: Family Medicine

## 2022-04-23 ENCOUNTER — Ambulatory Visit (INDEPENDENT_AMBULATORY_CARE_PROVIDER_SITE_OTHER): Payer: Medicare Other | Admitting: Family Medicine

## 2022-04-23 VITALS — BP 119/80 | HR 88 | Temp 98.4°F | Ht 66.0 in | Wt 163.8 lb

## 2022-04-23 DIAGNOSIS — M533 Sacrococcygeal disorders, not elsewhere classified: Secondary | ICD-10-CM

## 2022-04-23 DIAGNOSIS — E1169 Type 2 diabetes mellitus with other specified complication: Secondary | ICD-10-CM

## 2022-04-23 DIAGNOSIS — Z794 Long term (current) use of insulin: Secondary | ICD-10-CM | POA: Diagnosis not present

## 2022-04-23 DIAGNOSIS — I10 Essential (primary) hypertension: Secondary | ICD-10-CM

## 2022-04-23 LAB — POCT GLYCOSYLATED HEMOGLOBIN (HGB A1C): HbA1c, POC (controlled diabetic range): 12.5 % — AB (ref 0.0–7.0)

## 2022-04-23 MED ORDER — MELOXICAM 15 MG PO TABS: 15.0000 mg | ORAL_TABLET | Freq: Every day | ORAL | 0 refills | Status: DC

## 2022-04-23 NOTE — Progress Notes (Signed)
SUBJECTIVE:   CHIEF COMPLAINT / HPI:   Randy Fisher is a 67 y.o. male who presents to the [facility] clinic today to discuss the following concerns:   In person Swahili interpreter Hall Busing) present during entirety of visit.   Diabetes, Type 2 - Last A1c 13.2 in November  Lab Results  Component Value Date   HGBA1C 13.2 (A) 01/29/2022  - Medications: Prescribed 24 units of Lantus,  prescribed 16 units of NovoLog twice daily, 1000 mg metformin twice daily, prescribed 0.5 mg Ozempic weekly  He is taking Metformin as prescribed, it is unclear what other medications he is taking. Per the interpreter: "He is taking 14 units of one of them once a week, 15 units of another twice a day, and 24 units of one once a day."  - Compliance: Good  - Checking BG at home: Not checking.  - Microalbumin: Due but patient unable to void today - Statin: Atorvastatin 40 mg daily   Back Pain Not hurting currently. Mostly when he drives. Been present for about 2 years. Reports that the prescription he received in the past has not helped him too much. He brings all of his medications with him today but appears that he had been taking the Atorvastatin as needed for pain instead of his Baclofen. He actually had been taking his Baclofen daily instead of PRN.   PERTINENT  PMH / PSH: HTN, HLD, hx alcohol abuse  OBJECTIVE:   BP 119/80   Pulse 88   Temp 98.4 F (36.9 C)   Ht 5\' 6"  (1.676 m)   Wt 163 lb 12.8 oz (74.3 kg)   SpO2 98%   BMI 26.44 kg/m    General: NAD, pleasant, able to participate in exam Respiratory: normal effort Skin: warm and dry, no rashes noted Back Exam:  Inspection: Unremarkable  Palpable tenderness: to b/l SI joints Range of Motion:  Flexion 45 deg; Extension 30 deg; Side Bending to 45 deg bilaterally; Rotation to 45 deg bilaterally  Leg strength: Quad: 5/5 Hamstring: 5/5 Hip flexor: 5/5 Hip abductors: 5/5  Gait unremarkable. Psych: Normal affect and mood  ASSESSMENT/PLAN:   1.  Type 2 diabetes mellitus with other specified complication, with long-term current use of insulin (HCC) Hgb A1c 12.5, slightly improved compared to last check in November when it was 13.2. Still far from goal. Opted to not make any medication adjustments today as it was unclear what he was taking. Patient would benefit from increased time to discuss medications in detail and to clarify his regimen. From what was told by the interpreter it does not appear he is taking any of his insulin appropriately and does not appear to be taking his Ozempic.  - HgB Z6X - Basic Metabolic Panel - Scheduled with Dr. Valentina Lucks 2/7 for insulin teaching, titration  - Please collect urine microalbumin at that time; patient was unable to void during todays visit   2. Primary hypertension Normotensive today.  - Basic Metabolic Panel - Continue Lisinopril   3. SI (sacroiliac) pain Low back pain most consistent with SI pain given ttp on SI joints bilaterally. May have some arthritis here. No red flags, has been a chronic problem for him and worsened while driving. Does have baclofen that he has been previously prescribed. Will trial NSAID.  - meloxicam (MOBIC) 15 MG tablet; Take 1 tablet (15 mg total) by mouth daily.  Dispense: 30 tablet; Refill: 0 - Recommend heating pad as well - Could benefit from PT but transportation  barriers limit this     Sharion Settler, [place]

## 2022-04-23 NOTE — Patient Instructions (Addendum)
Tafadhali rudi baada ya siku 2 kwa ziara na timu yetu ya maduka ya dawa. Lete dawa zako ZOTE wakati huo. Randy Fisher tutahitaji kufanya marekebisho ya dawa.  Please return in 2 days for a visit with our pharmacy team. Bring ALL of your medications with you at that time. We will likely need to make medication adjustments.

## 2022-04-24 LAB — BASIC METABOLIC PANEL
BUN/Creatinine Ratio: 16 (ref 10–24)
BUN: 12 mg/dL (ref 8–27)
CO2: 23 mmol/L (ref 20–29)
Calcium: 8.8 mg/dL (ref 8.6–10.2)
Chloride: 104 mmol/L (ref 96–106)
Creatinine, Ser: 0.77 mg/dL (ref 0.76–1.27)
Glucose: 326 mg/dL — ABNORMAL HIGH (ref 70–99)
Potassium: 4.8 mmol/L (ref 3.5–5.2)
Sodium: 140 mmol/L (ref 134–144)
eGFR: 97 mL/min/{1.73_m2} (ref >59)

## 2022-04-25 ENCOUNTER — Ambulatory Visit: Payer: Medicare Other | Admitting: Pharmacist

## 2022-04-30 ENCOUNTER — Encounter: Payer: Self-pay | Admitting: Pharmacist

## 2022-04-30 ENCOUNTER — Ambulatory Visit (INDEPENDENT_AMBULATORY_CARE_PROVIDER_SITE_OTHER): Payer: Medicare Other | Admitting: Pharmacist

## 2022-04-30 VITALS — BP 125/67 | HR 91 | Wt 164.0 lb

## 2022-04-30 DIAGNOSIS — Z794 Long term (current) use of insulin: Secondary | ICD-10-CM | POA: Diagnosis not present

## 2022-04-30 DIAGNOSIS — E1169 Type 2 diabetes mellitus with other specified complication: Secondary | ICD-10-CM

## 2022-04-30 MED ORDER — NOVOLOG FLEXPEN 100 UNIT/ML ~~LOC~~ SOPN: 16.0000 [IU] | PEN_INJECTOR | Freq: Two times a day (BID) | SUBCUTANEOUS | 11 refills | Status: DC

## 2022-04-30 NOTE — Progress Notes (Signed)
S:     Chief Complaint  Patient presents with   Medication Management    Diabetes    67 y.o. male who presents for diabetes evaluation, education, and management.  PMH is significant for T2DM, HLD, HTN and neuropathy.  Patient was referred and last seen by Primary Care Provider, Dr. Nita Sells, on 04/23/2022.  At last visit, patient was continued on previous medication regimen due to unwillingness to make medication adjustments.   Today, patient arrives in good spirits and requires online Swahili interpreter Donella Stade [phone])   Current diabetes medications include: Humalog (insulin lispro) 16 units twice daily with a meal, Lantus (insulin glargine) 24 units daily, metformin 1000 mg twice daily, Ozempic (semaglutide) 0.5 mg weekly injection.  Current hypertension medications include: lisinopril 40 mg daily Current hyperlipidemia medications include: atorvastatin 40 mg daily  Patient denies adherence with medications. Patient reports only taking gabapentin once daily. Patient reports only using Ozempic (semaglutide) 0.25 mg weekly. Patient reports  reports taking Humalog (insulin lispro) as opposed to most current meal-time insulin Novolog (insulin aspart) flexpen.  Patient also reports taking atorvastatin 40 mg "as needed for pain".  Polypharmacy and language barrier is likely contributing to medication confusion/non-adherence.   O:   Review of Systems  All other systems reviewed and are negative.   Physical Exam Constitutional:      Appearance: Normal appearance.  Pulmonary:     Effort: Pulmonary effort is normal.     Breath sounds: Normal breath sounds.  Neurological:     Mental Status: He is alert.  Psychiatric:        Mood and Affect: Mood normal.        Behavior: Behavior normal.        Thought Content: Thought content normal.        Judgment: Judgment normal.    Lab Results  Component Value Date   HGBA1C 12.5 (A) 04/23/2022   Vitals:   04/30/22 1102  BP: 125/67   Pulse: 91  SpO2: 98%    Lipid Panel     Component Value Date/Time   CHOL 142 12/22/2020 1648   TRIG 77 12/22/2020 1648   HDL 55 12/22/2020 1648   CHOLHDL 2.6 12/22/2020 1648   CHOLHDL 5.8 (H) 06/03/2015 1640   VLDL 34 (H) 06/03/2015 1640   LDLCALC 72 12/22/2020 1648    Clinical Atherosclerotic Cardiovascular Disease (ASCVD): No  The 10-year ASCVD risk score (Arnett DK, et al., 2019) is: 27.7%   Values used to calculate the score:     Age: 39 years     Sex: Male     Is Non-Hispanic African American: Yes     Diabetic: Yes     Tobacco smoker: No     Systolic Blood Pressure: 0000000 mmHg     Is BP treated: Yes     HDL Cholesterol: 55 mg/dL     Total Cholesterol: 142 mg/dL   A/P: Diabetes longstanding currently uncontrolled (A1c from 04/2022 was 12.5%). Patient is able to verbalize appropriate hypoglycemia management plan. Medication adherence appears poor. Control is suboptimal due to confusion with medication regimen with a language barrier. -Continued basal insulin Lantus (insulin glargine) 24 units daily. -Started rapid insulin Novolog (insulin aspart) 16 units twice daily with food. -Discontinue rapid insulin Humalog (insulin lispro) 16 units twice daily with food.  -Increased dose of GLP-1 Ozempic (semaglutide) from 0.25 mg to 0.5 mg weekly injection.  -Continued metformin 1000 mg to twice daily.  -Provided patient with Accu-Chek guide meter  since he reported his meter at home to be broken.  -Patient educated on purpose, proper use, and potential adverse effects.  -Extensively discussed pathophysiology of diabetes, recommended lifestyle interventions, dietary effects on blood sugar control.  -Counseled on s/sx of and management of hypoglycemia.  -Next A1c anticipated 07/2022.  -Attempted to get UACR but patient refused due to not needing to use the bathroom.   ASCVD risk - primary prevention in patient with diabetes. Last LDL is 72; at goal of <70 mg/dL. However that  measurement was from 2022. ASCVD risk factors include T2DM, HTN, and HLD and 10-year ASCVD risk score of 25.5%. high intensity statin indicated.  -Continued atorvastatin 40 mg - instructed to take daily.   Hypertension longstanding currently controlled. Blood pressure goal of <130/80 mmHg. Medication adherence appears good.  -Continue lisinopril 2.5 mg daily  Neuropathy - longstanding due to lumbar compression noted in 2022. Current medications include gabapentin 300 mg 3 capsules TID. Adherence seems poor. Patient reports only taking 1 capsule once a day.  -Counseled patient to take as prescribed: 3 capsules 3 times daily.   Written patient instructions provided. Patient verbalized understanding of treatment plan.  Total time in face to face counseling 35 minutes.    Follow-up:  Pharmacist 05/28/2022. Patient seen with Dixon Boos, PharmD Candidate.

## 2022-04-30 NOTE — Progress Notes (Signed)
Reviewed: I agree with Dr. Koval's documentation and management. 

## 2022-04-30 NOTE — Assessment & Plan Note (Signed)
Diabetes longstanding currently uncontrolled (A1c from 04/2022 was 12.5%). Patient is able to verbalize appropriate hypoglycemia management plan. Medication adherence appears poor. Control is suboptimal due to confusion with medication regimen with a language barrier. -Continued basal insulin Lantus (insulin glargine) 24 units daily. -Started rapid insulin Novolog (insulin aspart) 16 units twice daily with food. -Discontinue rapid insulin Humalog (insulin lispro) 16 units twice daily with food.  -Increased dose of GLP-1 Ozempic (semaglutide) from 0.25 mg to 0.5 mg weekly injection.  -Continued metformin 1000 mg to twice daily.  -Provided patient with Accu-Chek guide meter since he reported his meter at home to be broken.  -Patient educated on purpose, proper use, and potential adverse effects.  -Extensively discussed pathophysiology of diabetes, recommended lifestyle interventions, dietary effects on blood sugar control.  -Counseled on s/sx of and management of hypoglycemia.  -Next A1c anticipated 07/2022.  -Attempted to get UACR but patient refused due to not needing to use the bathroom.

## 2022-04-30 NOTE — Patient Instructions (Addendum)
It was nice to see you today!  Your goal blood sugar is 80-130 before eating and less than 180 after eating.  Medication Changes: Begin Ozempic (semaglutide) 0.5 mg injection weekly.  Begin atorvastatin 40 mg once daily.   Continue metformin 1000 mg twice daily. Continue lisinopril 2.5 once daily.  Continue gabapentin 300 mg three times daily.  Continue baclofen 10 mg once daily as needed for muscle pain.   Discontinue Humalog (insulin lispro) 16 units twice daily.  Begin taking Novolog FlexPen (insulin aspart) 16 units twice daily.  Monitor blood sugars at home with your Accu-Chek meter that we provided at today's visit.  Keep a log (glucometer or piece of paper) to bring with you to your next visit. Remember to also check your blood sugars 2 hours after you eat a meal.   Keep up the good work with diet and exercise. Aim for a diet full of vegetables, fruit and lean meats (chicken, [place], fish). Try to limit salt intake by eating fresh or frozen vegetables (instead of canned), rinse canned vegetables prior to cooking and do not add any additional salt to meals.   Swahili:  Clovia Cuff nzuri [place] leo!  Lengo lako la sukari ya damu ni 80-130 kabla ya kula na chini ya [place].  Mabadiliko ya Dawa: Anza sindano ya Ozempic (semaglutide) 0.5 mg [employer]. Anza atorvastatin 40 mg mara moja kwa siku.  Endelea na metformin 1000 mg mara mbili kwa siku. Tumia lisinopril 2.5 mara moja kwa siku. Endelea gabapentin 300 mg mara tatu kila siku. Endelea baclofen 10 mg mara moja kila siku kama inahitajika kwa maumivu ya misuli.  Acha Humalog (insulin lispro) vitengo 16 mara mbili kwa siku. Anza kuch[place]ua Novolog FlexPen (insulin aspart) vitengo 16 mara mbili kwa siku.  Fuatilia sukari kwenye damu ukiwa nyumbani kwa kutumia mita yako ya Accu-Chek ambayo tulikupa kwenye ziara ya leo. Weka logi (glukometa au kipande Sandrea Hammond) ili kuja nawe kwenye ziara yako inayofuata. Kumbuka pia  kuangalia sukari yako ya damu saa 2 baada ya kula mlo.  Endelea na kazi nzuri na lishe na mazoezi. Lengo la chakula kilichojaa mboga [place], [place] na nyama [place], b[place], [place] Jaribu kupunguza ulaji wa chumvi kwa kula mboga safi au waliohifadhiwa (badala ya [employer]), suuza [place] za makopo kabla ya [place] na usiongeze chumvi yoyote kwenye milo.

## 2022-05-01 DIAGNOSIS — R7309 Other abnormal glucose: Secondary | ICD-10-CM

## 2022-05-01 LAB — GLUCOSE, POCT (MANUAL RESULT ENTRY): POC Glucose: 338 mg/dl — AB (ref 70–99)

## 2022-05-01 NOTE — Congregational Nurse Program (Signed)
  Dept: [phone]   Congregational Nurse Program Note  Date of Encounter: 05/01/2022  Past Medical History: Past Medical History:  Diagnosis Date   Alcohol abuse    Diabetes mellitus without complication ([place])    Hypertension     Encounter Details:  CNP Questionnaire - 05/01/22 1039       Questionnaire   Ask client: Do you give verbal consent for me to treat you today? Yes    Student Assistance N/A    [place] Served  [facility]    Visit Setting with Client Organization    Patient Status Refugee    Insurance Medicaid    Insurance/Financial Assistance Referral N/A    Medication Have Medication Insecurities;Provided Medication Assistance    Medical Provider Yes    Screening Referrals Made N/A    Medical Referrals Made N/A    Medical Appointment Made N/A    Recently w/o PCP, now 1st time PCP visit completed due to CNs referral or appointment made N/A    Food N/A    Transportation N/A    Housing/Utilities N/A    Interpersonal Safety N/A    Interventions N/A    Abnormal to Normal Screening Since Last CN Visit Blood Glucose    Screenings CN Performed Blood Glucose    Sent Client to Lab for: N/A    Did client attend any of the following based off CNs referral or appointments made? N/A    ED Visit Averted Yes    Life-Saving Intervention Made N/A             Patient came in requesting assistance on how to use glucose meter. Education provided with teach back. Patient was able to check his blood sugar using his glucometer.   Blood sugar today 338. Requested patient to bring home medication for review.   [name] Tyler Cubit RN BSN [place] Nurse [phone]

## 2022-05-08 ENCOUNTER — Other Ambulatory Visit: Payer: Self-pay | Admitting: Student

## 2022-05-08 DIAGNOSIS — G8929 Other chronic pain: Secondary | ICD-10-CM

## 2022-05-12 ENCOUNTER — Other Ambulatory Visit: Payer: Self-pay | Admitting: Student

## 2022-05-12 DIAGNOSIS — M545 Low back pain, unspecified: Secondary | ICD-10-CM

## 2022-05-28 ENCOUNTER — Ambulatory Visit: Payer: Medicare Other | Admitting: Pharmacist

## 2022-05-31 ENCOUNTER — Ambulatory Visit (INDEPENDENT_AMBULATORY_CARE_PROVIDER_SITE_OTHER): Payer: Medicare Other | Admitting: Pharmacist

## 2022-05-31 ENCOUNTER — Encounter: Payer: Self-pay | Admitting: Pharmacist

## 2022-05-31 VITALS — BP 119/70 | HR 90

## 2022-05-31 DIAGNOSIS — E1169 Type 2 diabetes mellitus with other specified complication: Secondary | ICD-10-CM | POA: Diagnosis not present

## 2022-05-31 DIAGNOSIS — Z794 Long term (current) use of insulin: Secondary | ICD-10-CM

## 2022-05-31 DIAGNOSIS — E785 Hyperlipidemia, unspecified: Secondary | ICD-10-CM

## 2022-05-31 MED ORDER — ATORVASTATIN CALCIUM 40 MG PO TABS: ORAL_TABLET | ORAL | 3 refills | Status: DC

## 2022-05-31 MED ORDER — INSULIN PEN NEEDLE 31G X 5 MM MISC: 1.0000 | Freq: Four times a day (QID) | 99 refills | Status: DC

## 2022-05-31 NOTE — Assessment & Plan Note (Signed)
Diabetes longstanding, currently uncontrolled based on A1c, but improving per patient report. Patient is able to verbalize appropriate hypoglycemia management plan. Patient is very confused on his insulin regimen as he has continued both Novolog (insulin aspart) and Humalog (insulin lispro). I have taken away his Humalog vial and pen and will dispose of it for him. He is not taking Ozempic (semaglutide). Patient would greatly benefit from a simplified regimen.  -Continued basal insulin Lantus (insulin glargine) 24 units daily. Instructed patient to call the clinic if hypoglycemia occurs.  -Continued rapid insulin Novolog (insulin aspart) 16 units twice daily with food. Will dispose his Humalog pen and vial for him to avoid confusion.  -Discontinued Ozempic (semaglutide) as patient states he has not been taking. Given the Ozempic (semaglutide) pen and insulin pens look similar, will discontinue for now to avoid confusion.   -Continued metformin 1000 mg BID.  -Patient educated on purpose, proper use, and potential adverse effects of insulin and metformin.  -Extensively discussed pathophysiology of diabetes, recommended lifestyle interventions, dietary effects on blood sugar control.  -Counseled on s/sx of and management of hypoglycemia.  -Next A1c anticipated May 2024.

## 2022-05-31 NOTE — Assessment & Plan Note (Signed)
ASCVD risk - primary  prevention in patient with diabetes. Last LDL is 72; close to goal of <70 mg/dL. ASCVD risk factors include T2DM, HTN, and HLD and 10-year ASCVD risk score of 25.5%. high intensity statin indicated. Patient previously reported taking atorvastatin PRN and atorvastatin was not in his medicine bag today.  -Restarted atorvastatin 40 mg daily. Refill sent today.

## 2022-05-31 NOTE — Progress Notes (Signed)
S:    Chief Complaint  Patient presents with   Medication Management    Diabetes   67 y.o. male who presents for diabetes evaluation, education, and management.  PMH is significant for T2DM, HLD, HTN and neuropathy.  Patient was referred and last seen by Primary Care Provider, Dr. Nita Sells, on 04/23/2022. Last seen by pharmacy clinic on 04/30/2022 where Humalog (insulin lispro) was changed to Novolog (insulin aspart). Ozempic (semaglutide) was also increased from 0.25 to 0.5 mg weekly.   Today, patient arrives in good spirits and presents with the assistance of an online interpretor [facility] #[id number]). Patient brings all of his medications with him today. He arrives with a Lantus pen, Novolog pen, Humalog pen, and Humalog vial. He does not bring in [place] with him. Contacted patient's pharmacy and confirmed it was delivered to  his house on 05/10/2022. Patient is adamant he has no other medications at home. It appears he has been dosing his Lantus and Novolog appropriately; however, he has been giving himself Humalog once weekly. He is also not taking Ozempic.   He states he lives at home with his two teenage children and they are able to read and write in [place]. Encouraged patient to put the instructions on his AVS (given in both [place]ili and [place]) on his fridge to help him remember which insulin pen and what dose to be given.   Current diabetes medications include: Lantus (insulin glargine) 24 units daily, Novolog (insulin aspart) 16 units BID, Humalog (insulin lispro) once weekly Current hypertension medications include: lisinopril 2.5 mg once daily Current hyperlipidemia medications include: atorvastatin 40 mg daily  Insurance coverage: Medicare/Medicaid  Patient denies hypoglycemic events. 1 isolated reading of 70 on 05/18/2022.   Reported home fasting blood sugars: 90-140, none >150  Reported 2 hour post-meal/random blood sugars: 100-140, none >150.  Patient denies nocturia  (nighttime urination).  Patient denies neuropathy (nerve pain).  O:   Review of Systems  All other systems reviewed and are negative.   Physical Exam Constitutional:      Appearance: Normal appearance.  Pulmonary:     Effort: Pulmonary effort is normal.  Neurological:     Mental Status: He is alert.  Psychiatric:        Mood and Affect: Mood normal.        Behavior: Behavior normal.        Thought Content: Thought content normal.     Lab Results  Component Value Date   HGBA1C 12.5 (A) 04/23/2022   Vitals:   05/31/22 1028  BP: 119/70  Pulse: 90  SpO2: 98%    Lipid Panel     Component Value Date/Time   CHOL 142 12/22/2020 1648   TRIG 77 12/22/2020 1648   HDL 55 12/22/2020 1648   CHOLHDL 2.6 12/22/2020 1648   CHOLHDL 5.8 (H) 06/03/2015 1640   VLDL 34 (H) 06/03/2015 1640   LDLCALC 72 12/22/2020 1648    Clinical Atherosclerotic Cardiovascular Disease (ASCVD): No  The 10-year ASCVD risk score (Arnett DK, et al., 2019) is: 25.5%   Values used to calculate the score:     Age: 18 years     Sex: Male     Is Non-Hispanic African American: Yes     Diabetic: Yes     Tobacco smoker: No     Systolic Blood Pressure: 123456 mmHg     Is BP treated: Yes     HDL Cholesterol: 55 mg/dL     Total Cholesterol: 142 mg/dL  A/P: Diabetes longstanding, currently uncontrolled based on A1c, but improving per patient report. Patient is able to verbalize appropriate hypoglycemia management plan. Patient is very confused on his insulin regimen as he has continued both Novolog (insulin aspart) and Humalog (insulin lispro). I have taken away his Humalog vial and pen and will dispose of it for him. He is not taking Ozempic (semaglutide). Patient would greatly benefit from a simplified regimen.  -Continued basal insulin Lantus (insulin glargine) 24 units daily. Instructed patient to call the clinic if hypoglycemia occurs.  -Continued rapid insulin Novolog (insulin aspart) 16 units twice  daily with food. Will dispose his Humalog pen and vial for him to avoid confusion.  -Discontinued Ozempic (semaglutide) as patient states he has not been taking. Given the Ozempic (semaglutide) pen and insulin pens look similar, will discontinue for now to avoid confusion.   -Continued metformin 1000 mg BID.  -Patient educated on purpose, proper use, and potential adverse effects of insulin and metformin.  -Extensively discussed pathophysiology of diabetes, recommended lifestyle interventions, dietary effects on blood sugar control.  -Counseled on s/sx of and management of hypoglycemia.  -Next A1c anticipated May 2024.   ASCVD risk - primary  prevention in patient with diabetes. Last LDL is 72; close to goal of <70 mg/dL. ASCVD risk factors include T2DM, HTN, and HLD and 10-year ASCVD risk score of 25.5%. high intensity statin indicated. Patient previously reported taking atorvastatin PRN and atorvastatin was not in his medicine bag today.  -Restarted atorvastatin 40 mg daily. Refill sent today.    Hypertension longstanding currently controlled. Blood pressure goal of <130/80 mmHg. Medication adherence reported. -Continue lisinopril 2.5 mg daily   Written patient instructions provided. Patient verbalized understanding of treatment plan.  Total time in face to face counseling 40 minutes.    Follow-up:  Pharmacist PRN. PCP clinic visit in July 06, 2022.  Patient seen with Louanne Belton PharmD PGY-1 Pharmacy Resident and Joseph Art, PharmD, PGY2 Pharmacy Resident.

## 2022-05-31 NOTE — Patient Instructions (Addendum)
It was nice to see you today!  Your goal blood sugar is 80-130 before eating and less than 180 after eating.  Ilikuwa nzuri [place] leo!  Lengo lako la sukari ya damu ni 80-130 kabla ya kula na chini ya [place].  Medication Changes [place] ya [place]:  Continue Lantus (the gray pen) 24 units once daily [place] Lantus (kalamu ya kijivu) vitengo 24 mara moja kwa siku  Continue Novolog (the blue pen) 16 units twice daily with meals [place] Novolog (kalamu ya bluu) vitengo 16 mara mbili kwa siku na milo  You will not continue the Ozempic (weekly injection) [place]lea na Ozempic (sindano ya kila wiki)  Monitor blood sugars at home and keep a log (glucometer or piece of paper) to bring with you to your next visit.  Keep up the good work with diet and exercise. Aim for a diet full of vegetables, fruit and lean meats (chicken, [place], fish). Try to limit salt intake by eating fresh or frozen vegetables (instead of canned), rinse canned vegetables prior to cooking and do not add any additional salt to meals.   Fuatilia sukari ya damu nyumbani na uweke logi (glukometa au kipande cha [place]) ili uje nawe kwenye ziara yako inayofuata.  Endelea na kazi nzuri na lishe na mazoezi. Lengo la chakula kilichojaa mboga [place], [place] na nyama [place] ([place], bata [place], [place]). Jaribu kupunguza ulaji wa chumvi kwa kula mboga safi au waliohifadhiwa (badala ya [employer]), suuza [place] za makopo kabla ya [place] na usiongeze chumvi [place] kwenye milo.

## 2022-06-01 NOTE — Progress Notes (Signed)
Reviewed and agree with Dr Koval's plan.   

## 2022-06-04 ENCOUNTER — Other Ambulatory Visit: Payer: Self-pay | Admitting: Student

## 2022-06-04 DIAGNOSIS — M545 Other chronic pain: Secondary | ICD-10-CM

## 2022-06-08 ENCOUNTER — Other Ambulatory Visit (HOSPITAL_COMMUNITY): Payer: Self-pay

## 2022-06-08 ENCOUNTER — Other Ambulatory Visit: Payer: Self-pay | Admitting: Student

## 2022-06-08 MED ORDER — HUMALOG KWIKPEN 200 UNIT/ML ~~LOC~~ SOPN: 16.0000 [IU] | PEN_INJECTOR | Freq: Two times a day (BID) | SUBCUTANEOUS | 2 refills | Status: DC

## 2022-06-08 NOTE — Progress Notes (Signed)
Patient insurance no longer covering Novolog on formulary, asking patient be switched to alternative. Have discussed w/ Dr. Valentina Lucks [facility] Pharmacist who recommends Humalog at same dose of 16 units BID w/ meals. Rx sent

## 2022-06-21 ENCOUNTER — Other Ambulatory Visit: Payer: Self-pay | Admitting: Student

## 2022-06-21 DIAGNOSIS — M545 Low back pain, unspecified: Secondary | ICD-10-CM

## 2022-07-02 ENCOUNTER — Other Ambulatory Visit: Payer: Self-pay | Admitting: Student

## 2022-07-02 DIAGNOSIS — K5900 Constipation, unspecified: Secondary | ICD-10-CM

## 2022-07-02 DIAGNOSIS — E1169 Type 2 diabetes mellitus with other specified complication: Secondary | ICD-10-CM

## 2022-07-06 ENCOUNTER — Ambulatory Visit (INDEPENDENT_AMBULATORY_CARE_PROVIDER_SITE_OTHER): Payer: Medicare Other | Admitting: Family Medicine

## 2022-07-06 ENCOUNTER — Ambulatory Visit: Payer: Medicare Other | Admitting: Student

## 2022-07-06 VITALS — BP 133/72 | HR 81 | Ht 66.0 in | Wt 174.2 lb

## 2022-07-06 DIAGNOSIS — M47816 Spondylosis without myelopathy or radiculopathy, lumbar region: Secondary | ICD-10-CM

## 2022-07-06 DIAGNOSIS — E1169 Type 2 diabetes mellitus with other specified complication: Secondary | ICD-10-CM | POA: Diagnosis not present

## 2022-07-06 DIAGNOSIS — Z794 Long term (current) use of insulin: Secondary | ICD-10-CM | POA: Diagnosis not present

## 2022-07-06 NOTE — Progress Notes (Deleted)
  SUBJECTIVE:   CHIEF COMPLAINT / HPI:   DM F/u Meds per last Pharmacy visit: -basal insulin Lantus (insulin glargine) 24 units daily. -rapid insulin Novolog (insulin aspart) 16 units twice daily with food. -discontinued Ozempic (semaglutide) to avoid confusion.   -metformin 1000 mg BID.  CBG range:  HLD Meds: atorvastatin 40 mg daily    PERTINENT  PMH / PSH: ***  Past Medical History:  Diagnosis Date   Alcohol abuse    Diabetes mellitus without complication (HCC)    Hypertension     Patient Care Team: Bess Kinds, MD as PCP - General (Family Medicine) System, Provider Not In OBJECTIVE:  There were no vitals taken for this visit. Physical Exam   ASSESSMENT/PLAN:  There are no diagnoses linked to this encounter. No follow-ups on file. Bess Kinds, MD 07/06/2022, 7:28 AM PGY-***, [facility] {    This will disappear when note is signed, click to select method of visit    :1}

## 2022-07-06 NOTE — Assessment & Plan Note (Signed)
Verify that he is taking the insulin properly.  Scheduled follow-up with PCP in 1 month for next A1c.

## 2022-07-06 NOTE — Patient Instructions (Addendum)
It was nice seeing you today!  I am referring you to physical therapy for your back.  Continue taking the same medications for your diabetes. -- Ninakuelekeza kwa tiba ya mwili kwa mgongo wako.  Endelea kutumia dawa zilezile za ugonjwa wako wa kisukari.  Stay well, Littie Deeds, MD [facility] [phone]  --  Make sure to check out at the front desk before you leave today.  Please arrive at least 15 minutes prior to your scheduled appointments.  If you had blood work today, I will send you a MyChart message or a letter if results are normal. Otherwise, I will give you a call.  If you had a referral placed, they will call you to set up an appointment. Please give [place] a call if you don't hear back in the next 2 weeks.  If you need additional refills before your next appointment, please call your pharmacy first.

## 2022-07-06 NOTE — Assessment & Plan Note (Signed)
Continues to develop chronic lower back pain secondary to old traumatic compression fracture and with degenerative changes noted on MRI as well.  Will refer him to physical therapy.
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
## 2022-07-11 ENCOUNTER — Other Ambulatory Visit: Payer: Self-pay | Admitting: Student

## 2022-07-11 DIAGNOSIS — G8929 Other chronic pain: Secondary | ICD-10-CM

## 2022-07-16 ENCOUNTER — Other Ambulatory Visit: Payer: Self-pay

## 2022-07-16 DIAGNOSIS — E118 Type 2 diabetes mellitus with unspecified complications: Secondary | ICD-10-CM

## 2022-07-17 ENCOUNTER — Other Ambulatory Visit: Payer: Self-pay | Admitting: Student

## 2022-07-17 DIAGNOSIS — G8929 Other chronic pain: Secondary | ICD-10-CM

## 2022-07-18 MED ORDER — GABAPENTIN 300 MG PO CAPS: ORAL_CAPSULE | ORAL | 1 refills | Status: DC

## 2022-08-10 ENCOUNTER — Other Ambulatory Visit: Payer: Self-pay | Admitting: Student

## 2022-08-10 DIAGNOSIS — Z794 Long term (current) use of insulin: Secondary | ICD-10-CM

## 2022-08-10 DIAGNOSIS — K5900 Constipation, unspecified: Secondary | ICD-10-CM

## 2022-08-14 ENCOUNTER — Ambulatory Visit: Payer: Medicare Other | Admitting: Student

## 2022-08-14 NOTE — Progress Notes (Deleted)
  SUBJECTIVE:   CHIEF COMPLAINT / HPI:   Diabetes F/u Meds: Lantuse 24 units, Novolog 16 units, metformin 1000 mg BID Foot Exam:  PERTINENT  PMH / PSH: ***  Past Medical History:  Diagnosis Date   Alcohol abuse    Diabetes mellitus without complication (HCC)    Hypertension     Patient Care Team: Bess Kinds, MD as PCP - General (Family Medicine) System, Provider Not In OBJECTIVE:  There were no vitals taken for this visit. Physical Exam   ASSESSMENT/PLAN:  There are no diagnoses linked to this encounter. No follow-ups on file. Bess Kinds, MD 08/14/2022, 6:32 AM PGY-***, [facility] {    This will disappear when note is signed, click to select method of visit    :1}

## 2022-08-16 ENCOUNTER — Encounter (HOSPITAL_COMMUNITY): Payer: Self-pay | Admitting: Emergency Medicine

## 2022-08-16 ENCOUNTER — Emergency Department (HOSPITAL_COMMUNITY)
Admission: EM | Admit: 2022-08-16 | Discharge: 2022-08-16 | Discharge status: Against Medical Advice | Payer: Medicare Other | Attending: Emergency Medicine | Admitting: Emergency Medicine

## 2022-08-16 ENCOUNTER — Other Ambulatory Visit: Payer: Self-pay

## 2022-08-16 DIAGNOSIS — R739 Hyperglycemia, unspecified: Secondary | ICD-10-CM | POA: Insufficient documentation

## 2022-08-16 DIAGNOSIS — Z5321 Procedure and treatment not carried out due to patient leaving prior to being seen by health care provider: Secondary | ICD-10-CM | POA: Diagnosis not present

## 2022-08-16 DIAGNOSIS — I10 Essential (primary) hypertension: Secondary | ICD-10-CM | POA: Diagnosis not present

## 2022-08-16 DIAGNOSIS — Y92410 Unspecified street and highway as the place of occurrence of the external cause: Secondary | ICD-10-CM | POA: Insufficient documentation

## 2022-08-16 DIAGNOSIS — Z041 Encounter for examination and observation following transport accident: Secondary | ICD-10-CM | POA: Diagnosis present

## 2022-08-16 LAB — CBG MONITORING, ED: Glucose-Capillary: 573 mg/dL (ref 70–99)
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
## 2022-08-16 NOTE — ED Provider Notes (Signed)
Please see Emergency Medicine Provider Evaluation note from [place]- I didn't see this patient primarily   Zadie Rhine, MD 08/16/22 2322

## 2022-08-16 NOTE — ED Notes (Signed)
  Patient requesting to leave AMA.  Patient states he has small children at home alone and will come back another day.  Patient advised to stay but states a friend will come pick him up.

## 2022-08-16 NOTE — ED Notes (Signed)
  Patient refused to sign AMA form.  Patient taken out in wheelchair and placed in family members car.  Spoke with family member and advised them to encourage patient to come back and be evaluated by EDP.
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
## 2022-08-17 ENCOUNTER — Emergency Department (HOSPITAL_COMMUNITY)
Admission: EM | Admit: 2022-08-17 | Discharge: 2022-08-17 | Disposition: A | Discharge status: Home / Self Care | Payer: Medicare Other | Attending: Emergency Medicine | Admitting: Emergency Medicine

## 2022-08-17 ENCOUNTER — Emergency Department (HOSPITAL_COMMUNITY): Payer: Medicare Other

## 2022-08-17 ENCOUNTER — Other Ambulatory Visit: Payer: Self-pay

## 2022-08-17 ENCOUNTER — Encounter (HOSPITAL_COMMUNITY): Payer: Self-pay | Admitting: *Deleted

## 2022-08-17 DIAGNOSIS — Z794 Long term (current) use of insulin: Secondary | ICD-10-CM | POA: Diagnosis not present

## 2022-08-17 DIAGNOSIS — Z7984 Long term (current) use of oral hypoglycemic drugs: Secondary | ICD-10-CM | POA: Diagnosis not present

## 2022-08-17 DIAGNOSIS — I10 Essential (primary) hypertension: Secondary | ICD-10-CM | POA: Insufficient documentation

## 2022-08-17 DIAGNOSIS — R911 Solitary pulmonary nodule: Secondary | ICD-10-CM | POA: Insufficient documentation

## 2022-08-17 DIAGNOSIS — Z23 Encounter for immunization: Secondary | ICD-10-CM | POA: Insufficient documentation

## 2022-08-17 DIAGNOSIS — S61213A Laceration without foreign body of left middle finger without damage to nail, initial encounter: Secondary | ICD-10-CM | POA: Insufficient documentation

## 2022-08-17 DIAGNOSIS — Z79899 Other long term (current) drug therapy: Secondary | ICD-10-CM | POA: Insufficient documentation

## 2022-08-17 DIAGNOSIS — R0789 Other chest pain: Secondary | ICD-10-CM | POA: Insufficient documentation

## 2022-08-17 DIAGNOSIS — M545 Low back pain, unspecified: Secondary | ICD-10-CM | POA: Insufficient documentation

## 2022-08-17 DIAGNOSIS — E119 Type 2 diabetes mellitus without complications: Secondary | ICD-10-CM | POA: Diagnosis not present

## 2022-08-17 DIAGNOSIS — K458 Other specified abdominal hernia without obstruction or gangrene: Secondary | ICD-10-CM | POA: Diagnosis not present

## 2022-08-17 DIAGNOSIS — S0990XA Unspecified injury of head, initial encounter: Secondary | ICD-10-CM | POA: Diagnosis not present

## 2022-08-17 DIAGNOSIS — Y92410 Unspecified street and highway as the place of occurrence of the external cause: Secondary | ICD-10-CM | POA: Diagnosis not present

## 2022-08-17 DIAGNOSIS — S6992XA Unspecified injury of left wrist, hand and finger(s), initial encounter: Secondary | ICD-10-CM | POA: Diagnosis present

## 2022-08-17 LAB — URINALYSIS, ROUTINE W REFLEX MICROSCOPIC
Bacteria, UA: NONE SEEN
Bilirubin Urine: NEGATIVE
Glucose, UA: >=500 mg/dL — AB
Hgb urine dipstick: NEGATIVE
Ketones, ur: NEGATIVE mg/dL
Leukocytes,Ua: NEGATIVE
Nitrite: NEGATIVE
Protein, ur: NEGATIVE mg/dL
Specific Gravity, Urine: 1.02 (ref 1.005–1.030)
pH: 7 (ref 5.0–8.0)

## 2022-08-17 LAB — CBC
HCT: 42.4 % (ref 39.0–52.0)
Hemoglobin: 14.3 g/dL (ref 13.0–17.0)
MCH: 28.4 pg (ref 26.0–34.0)
MCHC: 33.7 g/dL (ref 30.0–36.0)
MCV: 84.3 fL (ref 80.0–100.0)
Platelets: 186 10*3/uL (ref 150–400)
RBC: 5.03 MIL/uL (ref 4.22–5.81)
RDW: 13.2 % (ref 11.5–15.5)
WBC: 4.8 10*3/uL (ref 4.0–10.5)
nRBC: 0 % (ref 0.0–0.2)

## 2022-08-17 LAB — ETHANOL: Alcohol, Ethyl (B): <10 mg/dL (ref <10)

## 2022-08-17 LAB — RAPID URINE DRUG SCREEN, HOSP PERFORMED
Amphetamines: NOT DETECTED
Barbiturates: NOT DETECTED
Benzodiazepines: NOT DETECTED
Cocaine: NOT DETECTED
Opiates: NOT DETECTED
Tetrahydrocannabinol: NOT DETECTED

## 2022-08-17 LAB — BASIC METABOLIC PANEL
Anion gap: 6 (ref 5–15)
BUN: 10 mg/dL (ref 8–23)
CO2: 27 mmol/L (ref 22–32)
Calcium: 8.8 mg/dL — ABNORMAL LOW (ref 8.9–10.3)
Chloride: 102 mmol/L (ref 98–111)
Creatinine, Ser: 0.88 mg/dL (ref 0.61–1.24)
GFR, Estimated: >60 mL/min (ref >60)
Glucose, Bld: 381 mg/dL — ABNORMAL HIGH (ref 70–99)
Potassium: 4.4 mmol/L (ref 3.5–5.1)
Sodium: 135 mmol/L (ref 135–145)

## 2022-08-17 LAB — TROPONIN I (HIGH SENSITIVITY): Troponin I (High Sensitivity): 5 ng/L (ref <18)

## 2022-08-17 LAB — CBG MONITORING, ED: Glucose-Capillary: 404 mg/dL — ABNORMAL HIGH (ref 70–99)

## 2022-08-17 MED ORDER — CEPHALEXIN 500 MG PO CAPS: 500.0000 mg | ORAL_CAPSULE | Freq: Four times a day (QID) | ORAL | 0 refills | Status: DC

## 2022-08-17 MED ORDER — IOHEXOL 300 MG/ML  SOLN
100.0000 mL | Freq: Once | INTRAMUSCULAR | Status: AC | PRN
  Administered 2022-08-17: 100 mL via INTRAVENOUS

## 2022-08-17 MED ORDER — ACETAMINOPHEN 500 MG PO TABS: 500.0000 mg | ORAL_TABLET | Freq: Four times a day (QID) | ORAL | 0 refills | Status: DC | PRN

## 2022-08-17 MED ORDER — LIDOCAINE HCL 2 % IJ SOLN
10.0000 mL | Freq: Once | INTRAMUSCULAR | Status: AC
  Administered 2022-08-17: 200 mg
  Filled 2022-08-17: qty 20

## 2022-08-17 MED ORDER — IBUPROFEN 800 MG PO TABS: 800.0000 mg | ORAL_TABLET | Freq: Three times a day (TID) | ORAL | 0 refills | Status: DC

## 2022-08-17 MED ORDER — TETANUS-DIPHTH-ACELL PERTUSSIS 5-2.5-18.5 LF-MCG/0.5 IM SUSY
0.5000 mL | PREFILLED_SYRINGE | Freq: Once | INTRAMUSCULAR | Status: AC
  Administered 2022-08-17: 0.5 mL via INTRAMUSCULAR
  Filled 2022-08-17: qty 0.5
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
## 2022-08-20 ENCOUNTER — Other Ambulatory Visit: Payer: Self-pay | Admitting: Student

## 2022-09-19 DIAGNOSIS — R739 Hyperglycemia, unspecified: Secondary | ICD-10-CM

## 2022-09-19 LAB — GLUCOSE, POCT (MANUAL RESULT ENTRY): POC Glucose: 204 mg/dl — AB (ref 70–99)

## 2022-09-19 NOTE — Congregational Nurse Program (Signed)
  Dept: [phone]   Congregational Nurse Program Note  Date of Encounter: 09/19/2022  Past Medical History: Past Medical History:  Diagnosis Date   Alcohol abuse    Diabetes mellitus without complication (HCC)    Hypertension     Encounter Details:  CNP Questionnaire - 09/19/22 1324       Questionnaire   Ask client: Do you give verbal consent for me to treat you today? Yes    Student Assistance N/A    Location Patient Served  [place]    Visit Setting with Client Organization    Patient Status Refugee    Insurance Medicaid    Insurance/Financial Assistance Referral N/A    Medication Have Medication Insecurities;Provided Medication Assistance    Medical Provider Yes    Screening Referrals Made N/A    Medical Referrals Made N/A    Medical Appointment Made N/A    Recently w/o PCP, now 1st time PCP visit completed due to CNs referral or appointment made N/A    Food N/A    Transportation N/A    Housing/Utilities N/A    Interpersonal Safety N/A    Interventions N/A    Abnormal to Normal Screening Since Last CN Visit Blood Glucose    Screenings CN Performed Blood Glucose    Sent Client to Lab for: N/A    Did client attend any of the following based off CNs referral or appointments made? N/A    ED Visit Averted Yes    Life-Saving Intervention Made N/A            Patient brought in his insulin with questions regarding administration.  I requested patient to show me how he has been taking his insulin.   He has been using both Lantus and Humalog correctly but he has NOT being taking Oxempic.  I have reviewed Oxempic administration to take weekly. Patient was able to administer his first dose. He was advised to be taking once weekly on Wednesdays per order.  Nicole Cella Latash Nouri RN BSN PCCN  [facility] [phone]

## 2022-10-01 ENCOUNTER — Ambulatory Visit: Payer: Medicare Other | Admitting: Student

## 2022-10-01 ENCOUNTER — Ambulatory Visit (INDEPENDENT_AMBULATORY_CARE_PROVIDER_SITE_OTHER): Payer: Medicare Other | Admitting: Family Medicine

## 2022-10-01 ENCOUNTER — Other Ambulatory Visit: Payer: Self-pay

## 2022-10-01 VITALS — BP 123/80 | HR 98 | Ht 66.0 in | Wt 164.8 lb

## 2022-10-01 DIAGNOSIS — E1169 Type 2 diabetes mellitus with other specified complication: Secondary | ICD-10-CM

## 2022-10-01 DIAGNOSIS — I10 Essential (primary) hypertension: Secondary | ICD-10-CM | POA: Diagnosis not present

## 2022-10-01 DIAGNOSIS — E118 Type 2 diabetes mellitus with unspecified complications: Secondary | ICD-10-CM

## 2022-10-01 DIAGNOSIS — Z23 Encounter for immunization: Secondary | ICD-10-CM | POA: Diagnosis not present

## 2022-10-01 DIAGNOSIS — Z794 Long term (current) use of insulin: Secondary | ICD-10-CM | POA: Diagnosis not present

## 2022-10-01 LAB — POCT GLYCOSYLATED HEMOGLOBIN (HGB A1C): HbA1c, POC (controlled diabetic range): 11.7 % — AB (ref 0.0–7.0)

## 2022-10-01 MED ORDER — LANTUS SOLOSTAR 100 UNIT/ML ~~LOC~~ SOPN: 20.0000 [IU] | PEN_INJECTOR | Freq: Every day | SUBCUTANEOUS | 1 refills | Status: DC

## 2022-10-01 MED ORDER — METFORMIN HCL 1000 MG PO TABS
ORAL_TABLET | ORAL | 5 refills | Status: DC
Start: 2022-10-01 — End: 2023-10-09

## 2022-10-01 MED ORDER — HUMALOG KWIKPEN 200 UNIT/ML ~~LOC~~ SOPN: 12.0000 [IU] | PEN_INJECTOR | Freq: Two times a day (BID) | SUBCUTANEOUS | 5 refills | Status: DC

## 2022-10-01 MED ORDER — SEMAGLUTIDE(0.25 OR 0.5MG/DOS) 2 MG/1.5ML ~~LOC~~ SOPN
0.5000 mg | PEN_INJECTOR | SUBCUTANEOUS | 0 refills | Status: DC
Start: 2022-10-01 — End: 2023-03-05

## 2022-10-01 NOTE — Assessment & Plan Note (Signed)
Patient has improved A1c since last visit.  Now A1c is 11.1 from above 12 from prior visit.  This is most likely due to decreased weight since starting Ozempic.  Will continue Ozempic at 0.5 dose as patient has been tolerating and has been having significant effect.  However will need to decrease insulin as patient is at risk for hypoglycemia. - Decrease Lantus to 20 units daily from 26 units. - Decrease Humalog to 12 units twice daily from 16 units twice daily. - Continue to track fasting blood glucose - Appointment with [name] on July 23 valuation of CGM and insulin based on recent glucose measurements -Continue Ozempic at 0.5 dose

## 2022-10-01 NOTE — Patient Instructions (Addendum)
Shukrani kwa [place].  Tafadhali punguza insulini yako ya usiku hadi vitengo 20 na kupunguza insulini yako mara mbili kwa siku hadi vitengo 12.   Kama umewahi kuhisi jasho au mwanga Charolette Forward sukari yako na kula kitu na sukari ikiwa sukari yako ni ya chini.  Una miadi na Dr. Melvenia Needles Julai 23.  Utapata simu ya kupanga miadi yako ya daktari wa macho

## 2022-10-01 NOTE — Progress Notes (Addendum)
    SUBJECTIVE:   CHIEF COMPLAINT / HPI:   T2DM  Patient requests refill of Ozempic.  Denies any side effects.  Has been taking the 0.25 and the 0.50 doses.  Patient has lost 10 pounds since visit in 5/30.  His fasting BG was 66. He has been taking 26 units lantus nightly Taking humalog 16 units twice daily. Would prefer CGM to pricking himself all the time.  Denies any hypoglycemic symptoms.   PERTINENT  PMH / PSH: HTN, T2DM, HLD, Lumbar spondylosis   OBJECTIVE:   BP 123/80   Pulse 98   Ht 5\' 6"  (1.676 m)   Wt 164 lb 12.8 oz (74.8 kg)   SpO2 100%   BMI 26.60 kg/m   General: well appearing, in no acute distress CV: RRR, radial pulses equal and palpable, no BLE edema  Resp: Normal work of breathing on room air, CTAB Abd: Soft, non tender, non distended  Neuro: Alert & Oriented x 4  Foot: Patient has decreased sensation in bilateral feet up to mid shins, right foot thickened toenail on great toe and third toe.  Has callus on medial side of great toe and on the lateral side of the fifth toe on both feet.  No open wounds.   ASSESSMENT/PLAN:   Type 2 diabetes mellitus with other specified complication, with long-term current use of insulin (HCC) Assessment & Plan: Patient has improved A1c since last visit.  Now A1c is 11.1 from above 12 from prior visit.  This is most likely due to decreased weight since starting Ozempic.  Will continue Ozempic at 0.5 dose as patient has been tolerating and has been having significant effect.  However will need to decrease insulin as patient is at risk for hypoglycemia. - Decrease Lantus to 20 units daily from 26 units. - Decrease Humalog to 12 units twice daily from 16 units twice daily. - Continue to track fasting blood glucose - Appointment with [name] on July 23 valuation of CGM and insulin based on recent glucose measurements -Continue Ozempic at 0.5 dose  Orders: -     POCT glycosylated hemoglobin (Hb A1C) -     Microalbumin / creatinine  urine ratio -     Lipid panel -     Ambulatory referral to Ophthalmology -     Semaglutide(0.25 or 0.5MG /DOS); Inject 0.5 mg into the skin once a week. 0.5 mg weekly  Dispense: 3 mL; Refill: 0  Primary hypertension Controlled with current regimen -     Basic metabolic panel -  Continue lisinopril 2.5 mg  Need for pneumococcal 20-valent conjugate vaccination -     Pneumococcal conjugate vaccine 20-valent      Lockie Mola, MD [facility]

## 2022-10-01 NOTE — Addendum Note (Signed)
Addended by: Lockie Mola on: 10/01/2022 04:30 PM   Modules accepted: Level of Service

## 2022-10-02 LAB — BASIC METABOLIC PANEL
BUN/Creatinine Ratio: 12 (ref 10–24)
BUN: 11 mg/dL (ref 8–27)
CO2: 26 mmol/L (ref 20–29)
Calcium: 9.1 mg/dL (ref 8.6–10.2)
Chloride: 100 mmol/L (ref 96–106)
Creatinine, Ser: 0.94 mg/dL (ref 0.76–1.27)
Glucose: 166 mg/dL — ABNORMAL HIGH (ref 70–99)
Potassium: 4.4 mmol/L (ref 3.5–5.2)
Sodium: 138 mmol/L (ref 134–144)
eGFR: 88 mL/min/{1.73_m2} (ref >59)

## 2022-10-02 LAB — MICROALBUMIN / CREATININE URINE RATIO
Creatinine, Urine: 78.1 mg/dL
Microalb/Creat Ratio: <4 mg/g creat (ref 0–29)
Microalbumin, Urine: <3 ug/mL

## 2022-10-02 LAB — LIPID PANEL
Chol/HDL Ratio: 3.2 ratio (ref 0.0–5.0)
Cholesterol, Total: 162 mg/dL (ref 100–199)
HDL: 51 mg/dL (ref >39)
LDL Chol Calc (NIH): 87 mg/dL (ref 0–99)
Triglycerides: 134 mg/dL (ref 0–149)
VLDL Cholesterol Cal: 24 mg/dL (ref 5–40)

## 2022-10-05 ENCOUNTER — Other Ambulatory Visit: Payer: Self-pay | Admitting: Student

## 2022-10-05 DIAGNOSIS — I10 Essential (primary) hypertension: Secondary | ICD-10-CM

## 2022-10-09 ENCOUNTER — Ambulatory Visit: Payer: Medicare Other | Admitting: Pharmacist

## 2022-10-11 ENCOUNTER — Telehealth: Payer: Self-pay

## 2022-10-11 NOTE — Telephone Encounter (Signed)
Pharmacy Patient Advocate Encounter   Received notification from CoverMyMeds that prior authorization for Ozempic is required/requested.   PA required; PA submitted to [facility] MEDICARE via CoverMyMeds Key/confirmation #/EOC [id number] Status is pending

## 2022-10-12 ENCOUNTER — Other Ambulatory Visit (HOSPITAL_COMMUNITY): Payer: Self-pay

## 2022-10-12 NOTE — Telephone Encounter (Signed)
Pharmacy Patient Advocate Encounter  Received notification from  [facility]  that Prior Authorization for [facility] has been APPROVED from 10/11/22 to 10/11/23. Ran test claim, Copay is $4.60.

## 2022-10-18 ENCOUNTER — Telehealth: Payer: Self-pay | Admitting: Family Medicine

## 2022-10-18 NOTE — Telephone Encounter (Signed)
Called patient with swahili interpreter, patient did not answer. Left voice mail regarding patient's lab results and to call clinic with any questions. Patient had normal labs except for elevated glucose as expected. Left clinic callback number if patient has questions.

## 2022-10-18 NOTE — Telephone Encounter (Signed)
-----   Message from Levert Feinstein sent at 10/01/2022  5:26 PM EDT -----  ----- Message ----- From: Sunday Spillers, CMA Sent: 10/01/2022   2:02 PM EDT To: Latrelle Dodrill, MD

## 2022-11-05 ENCOUNTER — Encounter: Payer: Self-pay | Admitting: Family Medicine

## 2022-11-05 ENCOUNTER — Other Ambulatory Visit: Payer: Self-pay | Admitting: Student

## 2022-11-05 ENCOUNTER — Ambulatory Visit (INDEPENDENT_AMBULATORY_CARE_PROVIDER_SITE_OTHER): Payer: Medicare Other | Admitting: Family Medicine

## 2022-11-05 VITALS — BP 102/70 | HR 81 | Wt 163.0 lb

## 2022-11-05 DIAGNOSIS — G8929 Other chronic pain: Secondary | ICD-10-CM

## 2022-11-05 DIAGNOSIS — E1169 Type 2 diabetes mellitus with other specified complication: Secondary | ICD-10-CM | POA: Diagnosis not present

## 2022-11-05 DIAGNOSIS — Z794 Long term (current) use of insulin: Secondary | ICD-10-CM | POA: Diagnosis not present

## 2022-11-05 DIAGNOSIS — M47816 Spondylosis without myelopathy or radiculopathy, lumbar region: Secondary | ICD-10-CM

## 2022-11-05 DIAGNOSIS — M545 Low back pain, unspecified: Secondary | ICD-10-CM | POA: Diagnosis present

## 2022-11-05 DIAGNOSIS — E118 Type 2 diabetes mellitus with unspecified complications: Secondary | ICD-10-CM

## 2022-11-05 MED ORDER — ACETAMINOPHEN 500 MG PO TABS
500.0000 mg | ORAL_TABLET | Freq: Four times a day (QID) | ORAL | 2 refills | Status: AC | PRN
Start: 2022-11-05 — End: ?

## 2022-11-05 NOTE — Assessment & Plan Note (Signed)
Reports minimal benefit from baclofen. Reports gabapentin is helpful. - Start Tylenol 100mg  q6h - Cont gabapentin - Stop baclofen due to limited utility

## 2022-11-05 NOTE — Assessment & Plan Note (Signed)
At prior visit w/ [name] 1 month ago, was advised to decrease insulin doses due to hypoglycemia concerns. Pt did not understand dosing instructions, so I provided reiterated the change. Additionally, he was supposed to follow-up with [name] fro eval for CGM, but this did not happen, so I scheduled him today. - Humalog 12u BID - Lantus 20u daily - Ozempic 0.5mg  weekly - f/u w/ [name] for CGM

## 2022-11-05 NOTE — Patient Instructions (Addendum)
[name] - Asante kwa [place]  [name]:  1) Kwa maumivu ya mgongo - Baclofen haionekani [place] mengi ili [place] maumivu yako ya mgongo na pia ina madhara ambayo yanaweza kukufanya uchoke zaidi. Acha kuchukua baclofen. [name] kuchukua Gabapentin mara 3 kwa siku. - Anza kuchukua Tylenol 1000mg  (vidonge 2) mara 4 kwa siku (kila masaa 6).  2) [name] wako wa kisukari - Insulini ya humalog itachukua nafasi ya novolog yako - [name] 12 vya Humalog mara mbili kwa siku - [place] vitengo 20 vya Lantus kila siku - Fuatilia na [name] ili kujadili kupata kichunguzi cha glukosi  Tafadhali kila wakati lete chupa zako za dawa  Rudi kuonana na [name] ili kujadili kupata kichunguzi cha glukosi.    Good to see you today - Thank you for coming in  Things we discussed today:  1) For your back pain - The Baclofen does not seem to be doing much to improve your back pain and also has side effects that can make you more tired. Stop taking the baclofen. - Continue taking Gabapentin 3 times a day. - Start taking Tylenol 1000mg  (2 tablets) 4 times a day (every 6 hours).  2) For your diabetes - The humalog insulin is going to replace your novolog - Take 12 units of Humalog twice a day - Take 20 units of Lantus daily - Follow-up with [name] to discuss getting a glucose monitor  Please always bring your medication bottles  Come back to see [name] to discuss getting a glucose monitor.

## 2022-11-05 NOTE — Progress Notes (Signed)
    SUBJECTIVE:   CHIEF COMPLAINT / HPI:   Randy Fisher is a 67yo M w/ hx of chronic lumbar back pain and T2DM that p/f f/u of chronic back pain.  Chronic Back Pain - His chronic back is still bothering him. Feels like his meds are not helping enough. He is taking gabapentin and baclofen. - feels like gabapentin is helpful - does not think baclofen is very helpful  - not currently taking tylenol  T2DM - Reports he does not know how to use his humalog injection pen - Reports that he has no issues using his ozempic, novolog, and lantus - he is using 26u of lantus and 16u of humalog BID   OBJECTIVE:   BP 102/70   Pulse 81   Wt 163 lb (73.9 kg)   SpO2 99%   BMI 26.31 kg/m   General: Alert, pleasant man. NAD. HEENT: NCAT. MMM. CV: RRR, no murmurs.  Resp: CTAB, no wheezing or crackles. Normal WOB on RA.  Abm: Soft, nontender, nondistended. Msk: Tender to palpation along L paraspinal lumbar region  ASSESSMENT/PLAN:   T2DM (type 2 diabetes mellitus) (HCC) At prior visit w/ [name] 1 month ago, was advised to decrease insulin doses due to hypoglycemia concerns. Pt did not understand dosing instructions, so I provided reiterated the change. Additionally, he was supposed to follow-up with [name] fro eval for CGM, but this did not happen, so I scheduled him today. - Humalog 12u BID - Lantus 20u daily - Ozempic 0.5mg  weekly - f/u w/ [name] for CGM  Lumbar spondylosis Reports minimal benefit from baclofen. Reports gabapentin is helpful. - Start Tylenol 100mg  q6h - Cont gabapentin - Stop baclofen due to limited utility    Patient Instructions  [name] - [name]  [name]:  1) Kwa maumivu ya mgongo - Baclofen haionekani [place] mengi ili [place] maumivu yako ya mgongo na pia ina madhara ambayo yanaweza kukufanya uchoke zaidi. Acha kuchukua baclofen. [name] kuchukua Gabapentin mara 3 kwa siku. [name] kuchukua Tylenol 1000mg  (vidonge  2) mara 4 kwa siku (kila masaa 6).  2) [name] wako wa kisukari - Insulini ya humalog itachukua nafasi ya novolog yako - [name] 12 vya Humalog mara mbili kwa siku - [place] vitengo 20 vya Lantus kila siku - Fuatilia na [name] ili kujadili kupata kichunguzi cha glukosi  Tafadhali kila wakati lete chupa zako za dawa  Rudi kuonana na [name] ili kujadili kupata kichunguzi cha glukosi.    Good to see you today - Thank you for coming in  Things we discussed today:  1) For your back pain - The Baclofen does not seem to be doing much to improve your back pain and also has side effects that can make you more tired. Stop taking the baclofen. - Continue taking Gabapentin 3 times a day. - Start taking Tylenol 1000mg  (2 tablets) 4 times a day (every 6 hours).  2) For your diabetes - The humalog insulin is going to replace your novolog - Take 12 units of Humalog twice a day - Take 20 units of Lantus daily - Follow-up with [name] to discuss getting a glucose monitor  Please always bring your medication bottles  Come back to see [name] to discuss getting a glucose monitor.    Lincoln Brigham, MD [facility]

## 2022-11-08 ENCOUNTER — Other Ambulatory Visit (HOSPITAL_COMMUNITY): Payer: Self-pay

## 2022-11-08 ENCOUNTER — Telehealth: Payer: Self-pay

## 2022-11-08 ENCOUNTER — Ambulatory Visit (INDEPENDENT_AMBULATORY_CARE_PROVIDER_SITE_OTHER): Payer: Medicare Other | Admitting: Pharmacist

## 2022-11-08 ENCOUNTER — Encounter: Payer: Self-pay | Admitting: Pharmacist

## 2022-11-08 VITALS — BP 123/82 | HR 81 | Ht 65.5 in | Wt 164.8 lb

## 2022-11-08 DIAGNOSIS — Z794 Long term (current) use of insulin: Secondary | ICD-10-CM | POA: Diagnosis not present

## 2022-11-08 DIAGNOSIS — E1169 Type 2 diabetes mellitus with other specified complication: Secondary | ICD-10-CM | POA: Diagnosis not present

## 2022-11-08 MED ORDER — FREESTYLE LIBRE 3 READER DEVI
1.0000 | Freq: Once | 0 refills | Status: DC
Start: 2022-11-08 — End: 2022-11-08

## 2022-11-08 MED ORDER — FREESTYLE LIBRE 3 READER DEVI
1.0000 | Freq: Once | 0 refills | Status: AC
Start: 2022-11-08 — End: 2022-11-08
  Filled 2022-11-08: qty 1, 30d supply, fill #0

## 2022-11-08 MED ORDER — FREESTYLE LIBRE 3 SENSOR MISC
1.0000 | 11 refills | Status: DC
Start: 2022-11-08 — End: 2022-11-08

## 2022-11-08 MED ORDER — FREESTYLE LIBRE 3 SENSOR MISC
1.0000 | 11 refills | Status: DC
  Filled 2022-11-08: qty 2, 28d supply, fill #0

## 2022-11-08 NOTE — Assessment & Plan Note (Signed)
Diabetes longstanding, currently uncontrolled based on A1c of 11.7 (09/2022), but improving per patient report. Patient is able to verbalize appropriate hypoglycemia management plan.  Patient is very confused on his insulin regimen. Appears to be finishing the supply of Novolog (insulin aspart) on hand before starting Humalog (insulin lispro). Patient reports taking 16 units of Novolog a day (7 units in the morning, 6 units in the evening). Have asked patient to bring all of his medications including his insulin pens to his follow up appointment. Instructed patient to skip the evening dose of Lantus next Tuesday. Will educate patient on how to take the Lantus dose at follow up appointment on Wednesday and will instruct patient to switch to administering Lantus in the mornings. - Continued Lantus (insulin glargine) 26 units once daily - Continued Humalog (insulin lispro) 16 units (7 units in the morning, 6 units in the evening) - Continued metformin 1000 mg twice daily - Called patient's pharmacy ([facility]) regarding whether they can deliver the [place] 3 sensor or reader. Reported they are unable to run it under Part B. Attempted to get filled at [facility]. Reported the same thing. Will consult Siri Cole to assist with running under Part B through [employer]. - Patient educated on purpose, proper use, and potential adverse effects. - Extensively discussed pathophysiology of diabetes, recommended lifestyle interventions, dietary effects on blood sugar control.  - Counseled on s/sx of and management of hypoglycemia.

## 2022-11-08 NOTE — Progress Notes (Signed)
Reviewed and agree with Dr Koval's plan.   

## 2022-11-08 NOTE — Telephone Encounter (Signed)
Submitted CGM order to [employer] via [place] site.  (Unsure of correct supplier for [employer])

## 2022-11-08 NOTE — Patient Instructions (Addendum)
Ilikuwa nzuri [place] leo!  Lengo lako la sukari ya damu ni 80-130 kabla ya kula na chini ya 180 baada [place].  Mabadiliko ya Dawa: Endelea dawa zote sawa.   Tafadhali leta dawa zote pamoja na dawa zako za insulini kwenye ziara yako inayofuata.  Acha kutumia Lantus (kalamu ya insulini ya kijivu) Jumanne jioni.  Endelea na kazi nzuri na lishe na mazoezi. Lengo la chakula kilichojaa mboga [place], [place] na nyama [place] ([place], bata [place], [place]). Jaribu kupunguza ulaji wa chumvi kwa kula mboga safi au zilizogandishwa (badala ya [place]), suuza [place] za makopo kabla ya [place] na usiongeze chumvi yoyote kwenye milo.  It was nice to see you today!  Your goal blood sugar is 80-130 before eating and less than 180 after eating.  Medication Changes: Continue all medication the same.   Please bring all medications including your insulin medications to your next visit.  Stop taking Lantus (the gray insulin pen) on Tuesday evening.  Keep up the good work with diet and exercise. Aim for a diet full of vegetables, fruit and lean meats (chicken, [place], fish). Try to limit salt intake by eating fresh or frozen vegetables (instead of canned), rinse canned vegetables prior to cooking and do not add any additional salt to meals.

## 2022-11-08 NOTE — Progress Notes (Signed)
S:     Chief Complaint  Patient presents with   Diabetes Management Plan   67 y.o. male who presents for diabetes evaluation, education, and management. Patient arrives in good spirits and presents without any assistance. Patient speaks Swahili so connected with translator Luisa Hart [phone].   Patient was referred and last seen by Primary Care Provider, Dr. Barbaraann Faster, on 01/29/22.   PMH is significant for T2DM, HLD, HTN, and neuropathy.  At last visit with pharmacy clinic, discontinued Ozempic (semaglutide) to avoid confusion with insulin pens as they look similar (since then been restarted). Also restarted the atorvastatin 40 mg daily.  Current diabetes medications include: metformin 1000 mg twice daily with meal, Lantus (insulin glargine) 20 units once daily (patient taking differently 26 units once daily), Humalog (insulin lispro) 12 units twice daily with a meal (patient not taking, finishing supply of Novolog on hand before switching to Humalog) Current hypertension medications include: lisinopril 2.5 mg once daily Current hyperlipidemia medications include: atorvastatin 40 mg once daily  Patient reports adherence to taking all medications as prescribed.  Insurance coverage: Med Pay  Patient denies hypoglycemic events.   O:   Review of Systems  All other systems reviewed and are negative.   Physical Exam Constitutional:      Appearance: Normal appearance.  Neurological:     Mental Status: He is alert.  Psychiatric:        Mood and Affect: Mood normal.        Behavior: Behavior normal.        Thought Content: Thought content normal.        Judgment: Judgment normal.     Lab Results  Component Value Date   HGBA1C 11.7 (A) 10/01/2022   Vitals:   11/08/22 1008  BP: 123/82  Pulse: 81  SpO2: 100%    Lipid Panel     Component Value Date/Time   CHOL 162 10/01/2022 1432   TRIG 134 10/01/2022 1432   HDL 51 10/01/2022 1432   CHOLHDL 3.2 10/01/2022 1432    CHOLHDL 5.8 (H) 06/03/2015 1640   VLDL 34 (H) 06/03/2015 1640   LDLCALC 87 10/01/2022 1432    Clinical Atherosclerotic Cardiovascular Disease (ASCVD): No  The 10-year ASCVD risk score (Arnett DK, et al., 2019) is: 28.4%   Values used to calculate the score:     Age: 64 years     Sex: Male     Is Non-Hispanic African American: Yes     Diabetic: Yes     Tobacco smoker: No     Systolic Blood Pressure: 123 mmHg     Is BP treated: Yes     HDL Cholesterol: 51 mg/dL     Total Cholesterol: 162 mg/dL    A/P: Diabetes longstanding, currently uncontrolled based on A1c of 11.7 (09/2022), but improving per patient report. Patient is able to verbalize appropriate hypoglycemia management plan.  Patient is very confused on his insulin regimen. Appears to be finishing the supply of Novolog (insulin aspart) on hand before starting Humalog (insulin lispro). Patient reports taking 16 units of Novolog a day (7 units in the morning, 6 units in the evening). Have asked patient to bring all of his medications including his insulin pens to his follow up appointment. Instructed patient to skip the evening dose of Lantus next Tuesday. Will educate patient on how to take the Lantus dose at follow up appointment on Wednesday and will instruct patient to switch to administering Lantus in the mornings. - Continued  Lantus (insulin glargine) 26 units once daily - Continued Humalog (insulin lispro) 16 units (7 units in the morning, 6 units in the evening) - Continued metformin 1000 mg twice daily - Called patient's pharmacy ([facility]) regarding whether they can deliver the [place] 3 sensor or reader. Reported they are unable to run it under Part B. Attempted to get filled at [facility]. Reported the same thing. Will consult Siri Cole to assist with running under Part B through [facility]. - Patient educated on purpose, proper use, and potential adverse effects. - Extensively discussed pathophysiology  of diabetes, recommended lifestyle interventions, dietary effects on blood sugar control.  - Counseled on s/sx of and management of hypoglycemia.    ASCVD risk - primary prevention in patient with diabetes. Last LDL is 87 (09/2022) not at goal of <70 mg/dL.  - Continued atorvastatin 40 mg once daily.  Hypertension longstanding currently controlled with office reading of 123/82 mm Hg. Blood pressure goal of <130/80 mmHg. Medication adherence optimal.  - Continued lisinopril 2.5 mg once daily mg.  Written patient instructions provided. Patient verbalized understanding of treatment plan.  Total time in face to face counseling 37 minutes.    Follow-up:  Pharmacist follow up with [name] on 11/14/22 PCP clinic visit PRN Patient seen with Andee Poles, PharmD Candidate.

## 2022-11-14 ENCOUNTER — Encounter: Payer: Self-pay | Admitting: Pharmacist

## 2022-11-14 ENCOUNTER — Ambulatory Visit (INDEPENDENT_AMBULATORY_CARE_PROVIDER_SITE_OTHER): Payer: Medicare Other | Admitting: Pharmacist

## 2022-11-14 VITALS — Wt 167.2 lb

## 2022-11-14 DIAGNOSIS — Z794 Long term (current) use of insulin: Secondary | ICD-10-CM | POA: Diagnosis not present

## 2022-11-14 DIAGNOSIS — E1169 Type 2 diabetes mellitus with other specified complication: Secondary | ICD-10-CM

## 2022-11-14 NOTE — Patient Instructions (Addendum)
Ilikuwa nzuri [place] leo!  Lengo lako la sukari ya damu ni 80-130 kabla ya kula na chini ya 180 baada [place].  Mabadiliko ya Dawa:  Kuleta dawa zote kwa miadi inayofuata.   Endelea dawa zingine zote sawa.   Fuatilia sukari ya damu nyumbani na uweke logi (glukometa au kipande cha [place]) ili uje nawe kwenye ziara yako inayofuata.  Endelea na kazi nzuri na lishe na mazoezi. Lengo la chakula kilichojaa mboga [place], [place] na nyama [place] ([place], bata [place], [place]). Jaribu kupunguza ulaji wa chumvi kwa kula mboga safi au zilizogandishwa (badala ya [place]), suuza [place] za makopo kabla ya [place] na usiongeze chumvi yoyote kwenye milo.   It was nice to see you today!  Your goal blood sugar is 80-130 before eating and less than 180 after eating.  Medication Changes:  Bring all medications to the next appointment.   Continue all other medication the same.   Monitor blood sugars at home and keep a log (glucometer or piece of paper) to bring with you to your next visit.  Keep up the good work with diet and exercise. Aim for a diet full of vegetables, fruit and lean meats (chicken, [place], fish). Try to limit salt intake by eating fresh or frozen vegetables (instead of canned), rinse canned vegetables prior to cooking and do not add any additional salt to meals.

## 2022-11-14 NOTE — Assessment & Plan Note (Signed)
Diabetes longstanding currently with poor control.  Attempted to initiate CGM monitoring today but was unable to be performed due to lack of availability of CGM supplies from patient's pharmacy.   - Patient willing to reschedule appointment in 1 week.  Attempt CGM initiation at that time.  No changes to his treatment until that visit.

## 2022-11-14 NOTE — Progress Notes (Signed)
    S:     Chief Complaint  Patient presents with   Medication Management    Diabetes and Hypertension   67 y.o. male who presents for diabetes evaluation, education, and management. Patient arrives in good spirits and presents without any assistance. Connected speaks Swahili patient with interpreter who s  Patient was referred and last seen by Primary Care Provider, Dr. Barbaraann Faster, on 01/29/22.   PMH is significant for T2DM, HLD, HTN, and neuropathy.  At last visit, consulted Siri Cole to assist with running Part B through [place] for [place] 3 sensor and reader.  When patient arrived it was determined that the [place] 3 sensor and reader is still in progress.  Patient is aware that it will be delivered within the next 1-3 days.   Video interpreter used for entire visit:  Greggory Stallion [id number]   O:   Review of Systems  All other systems reviewed and are negative.   Physical Exam Pulmonary:     Effort: Pulmonary effort is normal.  Neurological:     Mental Status: He is alert.  Psychiatric:        Mood and Affect: Mood normal.        Thought Content: Thought content normal.        Judgment: Judgment normal.    Lab Results  Component Value Date   HGBA1C 11.7 (A) 10/01/2022    Patient is participating in a Managed Medicaid Plan:  Yes   A/P: Diabetes longstanding currently with poor control.  Attempted to initiate CGM monitoring today but was unable to be performed due to lack of availability of CGM supplies from patient's pharmacy.   - Patient willing to reschedule appointment in 1 week.  Attempt CGM initiation at that time.  No changes to his treatment until that visit.   Written patient instructions provided. Patient verbalized understanding of treatment plan.  Total time in face to face counseling 14 minutes.    Follow-up:  Pharmacist 1 week PCP clinic visit in prn Patient seen with Andee Poles, PharmD Candidate.

## 2022-11-16 NOTE — Progress Notes (Signed)
Reviewed and agree with Dr Koval's plan.   

## 2022-11-21 ENCOUNTER — Ambulatory Visit (INDEPENDENT_AMBULATORY_CARE_PROVIDER_SITE_OTHER): Payer: Medicare Other | Admitting: Pharmacist

## 2022-11-21 VITALS — Wt 169.4 lb

## 2022-11-21 DIAGNOSIS — Z794 Long term (current) use of insulin: Secondary | ICD-10-CM | POA: Diagnosis not present

## 2022-11-21 DIAGNOSIS — E1169 Type 2 diabetes mellitus with other specified complication: Secondary | ICD-10-CM

## 2022-11-21 DIAGNOSIS — E118 Type 2 diabetes mellitus with unspecified complications: Secondary | ICD-10-CM

## 2022-11-21 MED ORDER — HUMALOG KWIKPEN 200 UNIT/ML ~~LOC~~ SOPN: 6.0000 [IU] | PEN_INJECTOR | Freq: Two times a day (BID) | SUBCUTANEOUS | Status: DC

## 2022-11-21 MED ORDER — LANTUS SOLOSTAR 100 UNIT/ML ~~LOC~~ SOPN: 16.0000 [IU] | PEN_INJECTOR | Freq: Every day | SUBCUTANEOUS | Status: DC

## 2022-11-21 NOTE — Progress Notes (Signed)
S:     Chief Complaint  Patient presents with   Medication Management    Diabetes   67 y.o. male who presents for diabetes evaluation, education, and management. Patient arrives in good spirits and presents without any assistance. Patient speaks Swahili so collaborated with translator Merlyn Albert [phone].  Patient was referred and last seen by Primary Care Provider, Dr. Sherrilee Gilles, on 11/05/22.   PMH is significant for T2DM, HLD, HTN, and neuropathy.  At last visit, rescheduled appointment due to delay in running Part B through [facility] for [place] 3 sensor and reader.  Current diabetes medications include: metformin 1000 mg twice daily with meal, Lantus (insulin glargine) 16 units once daily , Humalog (insulin lispro) 6 units twice daily with a meal and Ozempic (semaglutide) 0.5mg  weekly.  Current hypertension medications include: lisinopril 2.5 mg once daily  Current hyperlipidemia medications include: atorvastatin 40 mg once daily   Patient reports adherence to taking all medications as prescribed.   Do you feel that your medications are working for you? yes Have you been experiencing any side effects to the medications prescribed? no  Insurance coverage: Medicare Part B / Medicaid  O:   Review of Systems  All other systems reviewed and are negative.   Physical Exam Constitutional:      Appearance: Normal appearance.  Pulmonary:     Effort: Pulmonary effort is normal.  Neurological:     Mental Status: He is alert.  Psychiatric:        Mood and Affect: Mood normal.        Behavior: Behavior normal.        Thought Content: Thought content normal.        Judgment: Judgment normal.     Lab Results  Component Value Date   HGBA1C 11.7 (A) 10/01/2022   There were no vitals filed for this visit.  Lipid Panel     Component Value Date/Time   CHOL 162 10/01/2022 1432   TRIG 134 10/01/2022 1432   HDL 51 10/01/2022 1432   CHOLHDL 3.2 10/01/2022 1432   CHOLHDL 5.8 (H)  06/03/2015 1640   VLDL 34 (H) 06/03/2015 1640   LDLCALC 87 10/01/2022 1432    Clinical Atherosclerotic Cardiovascular Disease (ASCVD): No  The 10-year ASCVD risk score (Arnett DK, et al., 2019) is: 28.4%   Values used to calculate the score:     Age: 27 years     Sex: Male     Is Non-Hispanic African American: Yes     Diabetic: Yes     Tobacco smoker: No     Systolic Blood Pressure: 123 mmHg     Is BP treated: Yes     HDL Cholesterol: 51 mg/dL     Total Cholesterol: 162 mg/dL   A/P: Diabetes longstanding currently uncontrolled based on A1c of 11.7 (09/2022), but improving per patient report. Patient is able to verbalize appropriate hypoglycemia management plan. Medication adherence appears good.  - Initiated Libre 3 sensor and assisted with application and connection to reader. - Continued Lantus (insulin glargine) 16 units once daily. - Continued Humalog (insulin lispro) 6 units BID 6 units in the morning, 6 units in the evening). - Continued metformin 1000 mg twice daily. -Patient educated on purpose, proper use, and potential adverse effects.  -Extensively discussed pathophysiology of diabetes, recommended lifestyle interventions, dietary effects on blood sugar control.  -Counseled on s/sx of and management of hypoglycemia.   ASCVD risk - primary prevention in patient with diabetes. Last LDL (  10/01/22) is 87 not at goal of <70 mg/dL.  - Continued atorvastatin 40 mg once daily.  Hypertension longstanding currently controlled. Blood pressure goal of <130/80 mmHg. Medication adherence optimal.  - Continued lisinopril 2.5 mg once daily mg.  Written patient instructions provided. Patient verbalized understanding of treatment plan.  Total time in face to face counseling 37 minutes.    Follow-up:  Pharmacist visit on 12/05/22 with [name] PCP clinic visit in PRN Patient seen with Alesia Banda, PharmD Candidate and Andee Poles, PharmD Candidate.

## 2022-11-21 NOTE — Patient Instructions (Addendum)
Ilikuwa nzuri [place] leo!  Lengo lako la sukari ya damu ni 80-130 kabla ya kula na chini ya 180 baada [place].  Mabadiliko ya Dawa:  Endelea kutumia dawa zingine zote sawa hadi utembelee tena.   Tutaonana baada ya wiki mbili ili kutumia kihisi cha pili na wewe.   Tafadhali leta vitambuzi vyako kwenye ziara inayofuata.   Endelea na kazi nzuri na lishe na mazoezi. Lengo la chakula kilichojaa mboga [place], [place] na nyama [place] ([place], bata [place], [place]). Jaribu kupunguza ulaji wa chumvi kwa kula mboga safi au zilizogandishwa (badala ya [place]), suuza [place] za makopo kabla ya [place] na usiongeze chumvi yoyote kwenye milo.    Programu ya Sensor Ikiwa unatumia [place], [place] kugonga Usaidizi [place] Kuu ili [place] mafunzo ya ndani ya programu kuhusu kutumia Kihisi. Tazama hapa chini kwa maagizo ya jinsi ya kupakua programu. 1. Weka Vihisi sehemu ya nyuma ya mkono wako wa juu pekee. Johnell Comings mengine, Kihisi kinaweza kisifanye kazi vizuri na kinaweza kukupa usomaji usio sahihi. Epuka maeneo yenye [place], fuko, alama za [employer].    2. [place] eneo la ngozi ambalo kwa ujumla hukaa bapa wakati wa shughuli zako za [place] za kila siku (hakuna [place] au [place]). [place] tovuti ambayo iko angalau inchi 1 (sentimita 2.5) kutoka kwa tovuti yoyote ya sindano. Ili [place] usumbufu au [place], [place] kuchagua tovuti tofauti na ile iliyotumiwa hivi karibuni. 3. Osha tovuti ya maombi kwa kutumia sabuni ya [place], [place], na kisha safisha kwa kufuta pombe. Hii itasaidia [place] yoyote ya mafuta ambayo yanaweza [place] kihisi kushikamana vizuri. Ruhusu tovuti [place]. [place]: Eneo lazima liwe safi na kavu, au Kihisi kinaweza kisibakie kimewashwa kwa muda wote uliobainishwa na kipengee chako cha [place]. 4. Hassell Halim kutoka kwa [place] cha [place] na uweke kifuniko kando.  5. Weka [place] Kihisi juu ya tovuti iliyotayarishwa na sukuma chini kwa  uthabiti ili kutumia [place] kwenye mwili wako. 6. Vuta kwa upole [place] Sensore mbali na mwili wako. Sensorer sasa inapaswa kushikamana na ngozi yako. 7. Lelon Huh Kihisi ni salama baada ya programu. Rudisha kofia kwenye [place] cha [place]. Tupa [place] Kihisi kilichotumika [place] na kanuni za eneo lako.  Nini Lavonna Monarch Sensorer [employer] Corporation au Vipi Ikiwa Sensorer Yangu Haifanyi Kazi? Piga simu kwa [place] ya [employer] kwa [phone] [place] kwa siku 7 kwa wiki Galliano 8AM-8PM EST, bila kujumuisha likizo [place] una vitambuzi vingi huanguka kabla ya siku [address] [place] [facility] kwa [phone]  It was nice to see you today!  Your goal blood sugar is 80-130 before eating and less than 180 after eating.  Medication Changes:  Continue all other medication the same until next visit.   We will see you in two weeks to apply the second sensor with you.   Please bring your sensors with you to the next visit .   Keep up the good work with diet and exercise. Aim for a diet full of vegetables, fruit and lean meats (chicken, [place], fish). Try to limit salt intake by eating fresh or frozen vegetables (instead of canned), rinse canned vegetables prior to cooking and do not add any additional salt to meals.    Sensor Application If using the App, you can tap Help in the Main Menu to access an in-app tutorial on applying a Sensor. See below for instructions on how to download the app. Apply Sensors only on the back of your upper arm. If placed in other areas, the Sensor may not  function properly and could give you inaccurate readings. Avoid areas with scars, moles, stretch marks, or lumps.   Select an area of skin that generally stays flat during your normal daily activities (no bending or folding). Choose a site that is at least 1 inch (2.5 cm) away from any injection sites. To prevent discomfort or skin irritation, you should select a  different site other than the one most recently used. Wash application site using a plain soap, dry, and then clean with an alcohol wipe. This will help remove any oily residue that may prevent the sensor from sticking properly. Allow site to air dry before proceeding. Note: The area MUST be clean and dry, or the Sensor may not stay on for the full wear duration specified by your Sensor insert. 4. Unscrew the cap from the Sensor Applicator and set the cap aside.  5. Place the Sensor Applicator over the prepared site and push down firmly to apply the Sensor to your body. 6. Gently pull the Sensor Applicator away from your body. The Sensor should now be attached to your skin. 7. Make sure the Sensor is secure after application. Put the cap back on the Sensor Applicator. Discard the used Engineer, agricultural according to local regulations.  What If My Sensor Falls Off or What If My Sensor Isn't Working? Call [employer] Customer Care Team at [phone] Available 7 days a week from 8AM-8PM EST, excluding holidays If yo have multiple sensors fall off prior to 14 days of use, contact [facility] at [phone]   The App Download the FreeStyle [place] 3 App in your phone's app store   Load the app and select get started now Create an account  Tap scan new sensor Follow the prompts on the screen. If your sensor does not sync, try moving your phone slowly around the sensor. Phone cases may affect scanning. This will be the only time you have to scan the sensor until you apply a new sensor in 14 days.  There will be a 60 minute start up period until the app will display your glucose reading

## 2022-11-21 NOTE — Assessment & Plan Note (Signed)
Diabetes longstanding currently uncontrolled based on A1c of 11.7 (09/2022), but improving per patient report. Patient is able to verbalize appropriate hypoglycemia management plan. Medication adherence appears good.  - Initiated Libre 3 sensor and assisted with application and connection to reader. - Continued Lantus (insulin glargine) 16 units once daily. - Continued Humalog (insulin lispro) 6 units BID 6 units in the morning, 6 units in the evening). - Continued metformin 1000 mg twice daily. -Patient educated on purpose, proper use, and potential adverse effects.  -Extensively discussed pathophysiology of diabetes, recommended lifestyle interventions, dietary effects on blood sugar control.  -Counseled on s/sx of and management of hypoglycemia.

## 2022-11-22 NOTE — Progress Notes (Signed)
Reviewed and agree with Dr Koval's plan.   

## 2022-12-04 DIAGNOSIS — R7309 Other abnormal glucose: Secondary | ICD-10-CM

## 2022-12-04 LAB — GLUCOSE, POCT (MANUAL RESULT ENTRY): POC Glucose: 199 mg/dL — AB (ref 70–99)

## 2022-12-04 NOTE — Congregational Nurse Program (Cosign Needed Addendum)
Patient presenting for placement of Libre CGM. Placed Libre CGM successfully; initial reading was BG of 196, consistent with fingerstick BG of 199. Plan to follow-up in 1 week to replace CGM and continue teaching about how to use the monitor.

## 2022-12-05 ENCOUNTER — Ambulatory Visit: Payer: Medicare Other | Admitting: Pharmacist

## 2022-12-17 ENCOUNTER — Other Ambulatory Visit: Payer: Self-pay | Admitting: Family Medicine

## 2022-12-17 DIAGNOSIS — E118 Type 2 diabetes mellitus with unspecified complications: Secondary | ICD-10-CM

## 2023-01-08 ENCOUNTER — Encounter: Payer: Self-pay | Admitting: *Deleted

## 2023-01-08 NOTE — Congregational Nurse Program (Signed)
  Dept: [phone]   Congregational Nurse Program Note  Date of Encounter: 01/08/2023  Past Medical History: Past Medical History:  Diagnosis Date   Alcohol abuse    Diabetes mellitus without complication (HCC)    Hypertension     Encounter Details:  Community Questionnaire - 01/08/23 1439       Questionnaire   Ask client: Do you give verbal consent for me to treat you today? Yes    Student Assistance N/A    Location Patient Served  [facility]    Encounter Setting CN site    Population Status Migrant/Refugee    Insurance Medicaid    Insurance/Financial Assistance Referral N/A    Medication Have Medication Insecurities    Medical Provider Yes    Screening Referrals Made N/A    Medical Referrals Made N/A    Medical Appointment Completed N/A    CNP Interventions Advocate/Support;Educate;Reviewed Medications    Screenings CN Performed Blood Pressure    ED Visit Averted Yes    Life-Saving Intervention Made N/A            Client came into [facility] for BP check and for assistance with applying his free style libre 3.  He instructed this CN regarding application of device and testing.  We applied the device together.  Client will read the device after 1 hour per directions.  He brought his medications with him and we went over the medications.  He will return to this nurse center as he desired for any additional help needed. Roderic Palau, RN, MSN, CNP [phone] Office [phone] Cell

## 2023-01-18 ENCOUNTER — Other Ambulatory Visit: Payer: Self-pay | Admitting: Student

## 2023-01-18 DIAGNOSIS — K5900 Constipation, unspecified: Secondary | ICD-10-CM

## 2023-01-29 ENCOUNTER — Other Ambulatory Visit: Payer: Self-pay | Admitting: Student

## 2023-01-30 ENCOUNTER — Other Ambulatory Visit: Payer: Self-pay | Admitting: Student

## 2023-02-01 ENCOUNTER — Other Ambulatory Visit: Payer: Self-pay | Admitting: Student

## 2023-02-01 MED ORDER — INSULIN PEN NEEDLE 31G X 5 MM MISC: 1.0000 | Freq: Four times a day (QID) | 5 refills | Status: DC

## 2023-02-12 ENCOUNTER — Ambulatory Visit (INDEPENDENT_AMBULATORY_CARE_PROVIDER_SITE_OTHER): Payer: Medicare Other | Admitting: Student

## 2023-02-12 VITALS — BP 118/76 | HR 79 | Ht 65.5 in | Wt 161.8 lb

## 2023-02-12 DIAGNOSIS — I10 Essential (primary) hypertension: Secondary | ICD-10-CM

## 2023-02-12 DIAGNOSIS — E118 Type 2 diabetes mellitus with unspecified complications: Secondary | ICD-10-CM | POA: Diagnosis not present

## 2023-02-12 DIAGNOSIS — Z794 Long term (current) use of insulin: Secondary | ICD-10-CM

## 2023-02-12 DIAGNOSIS — E1169 Type 2 diabetes mellitus with other specified complication: Secondary | ICD-10-CM

## 2023-02-12 DIAGNOSIS — E782 Mixed hyperlipidemia: Secondary | ICD-10-CM

## 2023-02-12 LAB — POCT GLYCOSYLATED HEMOGLOBIN (HGB A1C): HbA1c, POC (controlled diabetic range): 8.5 % — AB (ref 0.0–7.0)

## 2023-02-12 MED ORDER — LANTUS SOLOSTAR 100 UNIT/ML ~~LOC~~ SOPN: 20.0000 [IU] | PEN_INJECTOR | Freq: Every day | SUBCUTANEOUS | 5 refills | Status: DC

## 2023-02-12 NOTE — Patient Instructions (Addendum)
It was great to see you! Thank you for allowing me to participate in your care!  I recommend that you always bring your medications to each appointment as this makes it easy to ensure we are on the correct medications and helps [place] not miss when refills are needed.  Our plans for today:  - Diabetes (Grey Pen) Continue Lantus (insulin glargine) 16 units once daily in the morning.  (Black Pen) Continue Humalog (insulin lispro) 6 units in the morning, 6 units in the evening.  Continue metformin 1000 mg in the morning and 1000 mg in the evening. Follow up with [name] next Tuesday 02/19/2023 at 10:30 am  - High Blood Pressure  Continue Lisinopril 2.5 mg daily  -High Cholesterol  Continue Atorvastatin 40 mg daily  Take care and seek immediate care sooner if you develop any concerns.   Dr. Bess Kinds, MD [facility]   [facility] nzuri [place]! Asante kwa kuniruhusu kushiriki Jonetta Speak wako!  Berneta Sages dawa zako kila mara kwa kila [place] Laurence Slate Henriette Combs kuwa tunatumia dawa sahihi na hutusaidia tusikose wakati kujaza kunapohitajika.  Mipango yetu ya leo:  - Ugonjwa wa kisukari (Kalamu ya Juanita Craver) Endelea Lantus (insulin glargine) vitengo 16 mara moja kila siku asubuhi.  (Kalamu Nyeusi) Endelea Humalog (insulini lispro) vitengo 6 asubuhi, vitengo 6 jioni.  Endelea na metformin 1000 mg asubuhi na 1000 mg jioni. Fuatilia [name] Jumanne ijayo 02/19/2023 saa 10:30 asubuhi.  - Shinikizo la Juu la Damu  Tumia Lisinopril 2.5 mg kila siku  - Cholesterol nyingi  Endelea Atorvastatin 40 mg kila siku  Jihadharini na utafute huduma ya haraka mapema ikiwa unapata wasiwasi wowote.   Dkt. Bess Kinds, MD Dawa ya Familia ya [facility]

## 2023-02-12 NOTE — Progress Notes (Signed)
  SUBJECTIVE:   CHIEF COMPLAINT / HPI:   HTN Meds: Lisinopril 2.5 mg  DM2 Lantus (insulin glargine) 16 units once daily. Humalog (insulin lispro) 6 units BID 6 units in the morning, 6 units in the evening). metformin 1000 mg twice daily.  Today: Reports taking  26 units of lantus in the evening ~ 6 pm 16 units of humalog twice a day Metformin 1000 mg BID Ozempic 5 mg weekly CBG after lantus 50-60's on CGM and ranging around 250-300 during day.    PERTINENT  PMH / PSH:    OBJECTIVE:  There were no vitals taken for this visit. Physical Exam Constitutional:      General: He is not in acute distress.    Appearance: Normal appearance. He is not ill-appearing.  Cardiovascular:     Rate and Rhythm: Normal rate and regular rhythm.     Pulses: Normal pulses.     Heart sounds: Normal heart sounds. No murmur heard.    No friction rub. No gallop.  Pulmonary:     Effort: Pulmonary effort is normal. No respiratory distress.     Breath sounds: Normal breath sounds. No stridor. No wheezing, rhonchi or rales.  Musculoskeletal:     Right lower leg: No swelling. No edema.     Left lower leg: No swelling. No edema.  Skin:    Capillary Refill: Capillary refill takes less than 2 seconds.  Neurological:     Mental Status: He is alert.  Psychiatric:        Mood and Affect: Mood normal.        Behavior: Behavior normal.      ASSESSMENT/PLAN:   Assessment & Plan Type 2 diabetes mellitus with other specified complication, with long-term current use of insulin (HCC) Patient comes in for follow-up of his diabetes.  A1c improved 8.5, from 11.2.  Patient reports good compliance with medication however has been taking medications at different doses than prescribed.  Patient CBGs ranging 50s to 60s, after 26 units Lantus in the evening.  Discussed taking Lantus in the morning, reducing Lantus dose, and reviewed all insulin dosing, with association for color of insulin pens.  Plan for follow-up  next week with Dr. Louanne Belton, to be sure patient taking medication as prescribed.  Patient was still taking Ozempic despite it being stopped in previous visits, Ozempic pen collected from patient, discussed not taking anymore. - 16 units Lantus daily, in morning (Grey Pen) - 6 units of Humalog twice daily (Black Pen) - 1000 mg metformin twice daily - Follow-up next week - Discontinue Ozempic, for confusion with medications Primary hypertension Patient reports good compliance with medication, BP at goal today. - Continue lisinopril 2.5 mg daily Mixed hyperlipidemia Patient reports good compliance with medication. - Continue atorvastatin 40 mg daily No follow-ups on file. Bess Kinds, MD 02/12/2023, 7:35 AM PGY-3, [facility]

## 2023-02-12 NOTE — Assessment & Plan Note (Addendum)
Patient reports good compliance with medication. - Continue atorvastatin 40 mg daily

## 2023-02-12 NOTE — Assessment & Plan Note (Addendum)
Patient comes in for follow-up of his diabetes.  A1c improved 8.5, from 11.2.  Patient reports good compliance with medication however has been taking medications at different doses than prescribed.  Patient CBGs ranging 50s to 60s, after 26 units Lantus in the evening.  Discussed taking Lantus in the morning, reducing Lantus dose, and reviewed all insulin dosing, with association for color of insulin pens.  Plan for follow-up next week with Dr. Louanne Belton, to be sure patient taking medication as prescribed.  Patient was still taking Ozempic despite it being stopped in previous visits, Ozempic pen collected from patient, discussed not taking anymore. - 16 units Lantus daily, in morning (Grey Pen) - 6 units of Humalog twice daily (Black Pen) - 1000 mg metformin twice daily - Follow-up next week - Discontinue Ozempic, for confusion with medications

## 2023-02-12 NOTE — Assessment & Plan Note (Addendum)
Patient reports good compliance with medication, BP at goal today. - Continue lisinopril 2.5 mg daily

## 2023-02-19 ENCOUNTER — Encounter: Payer: Self-pay | Admitting: Pharmacist

## 2023-02-19 ENCOUNTER — Ambulatory Visit (INDEPENDENT_AMBULATORY_CARE_PROVIDER_SITE_OTHER): Payer: Medicare Other | Admitting: Pharmacist

## 2023-02-19 VITALS — BP 125/87 | HR 74 | Wt 165.8 lb

## 2023-02-19 DIAGNOSIS — E118 Type 2 diabetes mellitus with unspecified complications: Secondary | ICD-10-CM | POA: Diagnosis not present

## 2023-02-19 DIAGNOSIS — K5900 Constipation, unspecified: Secondary | ICD-10-CM

## 2023-02-19 DIAGNOSIS — Z794 Long term (current) use of insulin: Secondary | ICD-10-CM

## 2023-02-19 DIAGNOSIS — E1169 Type 2 diabetes mellitus with other specified complication: Secondary | ICD-10-CM | POA: Diagnosis not present

## 2023-02-19 MED ORDER — POLYETHYLENE GLYCOL 3350 17 GM/SCOOP PO POWD: ORAL | 6 refills | Status: DC

## 2023-02-19 MED ORDER — LANTUS SOLOSTAR 100 UNIT/ML ~~LOC~~ SOPN: 16.0000 [IU] | PEN_INJECTOR | Freq: Every day | SUBCUTANEOUS | 5 refills | Status: DC

## 2023-02-19 NOTE — Assessment & Plan Note (Signed)
Diabetes longstanding and currently slightly improved based on recent A1C. Patient is able to verbalize appropriate hypoglycemia management plan and denies low readings or symptoms since stopping Ozempic. Medication adherence appears good. Control is remains suboptimal due to variation in activity and diet. -Continued basal insulin Lantus(insulin glargine) at 16 units daily in the morning.  -Continued rapid insulin Humalog (insulin lispro) at 6 units prior to two meals daily.  -Continued metformin 1000mg  BID.  - Spent majority of visit re-educating on use of CGM and placing new sensor - for use with READER -Patient educated on purpose, proper use, and potential adverse effects.

## 2023-02-19 NOTE — Progress Notes (Signed)
S:     Chief Complaint  Patient presents with   Medication Management    Diabetes - CGM  issue   67 y.o. male who presents for diabetes evaluation, education, and management. Patient arrives in good spirits and presents without any assistance. Patient speaks Swahili so collaborated with translator Leonette Most ID [phone]  Patient was referred and last seen by Primary Care Provider, Dr. Barbaraann Faster, on 02/12/2023.   PMH is significant for T2DM, HLD, HTN, and neuropathy .  At last visit, Libre 3 sensor and reader were started.  However patient has stopped using due to confusion related to placing new sensor.   New sensor placed today.    Current diabetes medications include: metformin 1000 mg twice daily with meal, Lantus (insulin glargine) 16 units once daily , Humalog (insulin lispro) 6 units twice daily and metformin 1000mg  BID.  No longer taking Ozempic (semaglutide). Current hypertension medications include: lisinopril 2.5 mg once daily  Current hyperlipidemia medications include: atorvastatin 40 mg once daily   Do you feel that your medications are working for you? Yes States feeling much better now and wants to continue felling well.  Have you been experiencing any side effects to the medications prescribed? no Do you have any problems obtaining medications due to transportation or finances? no Insurance coverage: Medicare A/B and Medicaid  Patient denies recent hypoglycemic events since stopping Ozempic (semaglutide).   Reported home fasting blood sugars: Not checking.    Patient reports continued neuropathy (nerve pain) in hands.   O:   Review of Systems  All other systems reviewed and are negative.   Physical Exam Vitals reviewed.  Constitutional:      Appearance: Normal appearance.  Pulmonary:     Effort: Pulmonary effort is normal.  Neurological:     Mental Status: He is alert.  Psychiatric:        Mood and Affect: Mood normal.        Behavior: Behavior normal.         Thought Content: Thought content normal.     Lab Results  Component Value Date   HGBA1C 8.5 (A) 02/12/2023   Vitals:   02/19/23 1015  BP: 125/87  Pulse: 74  SpO2: 100%    Lipid Panel     Component Value Date/Time   CHOL 162 10/01/2022 1432   TRIG 134 10/01/2022 1432   HDL 51 10/01/2022 1432   CHOLHDL 3.2 10/01/2022 1432   CHOLHDL 5.8 (H) 06/03/2015 1640   VLDL 34 (H) 06/03/2015 1640   LDLCALC 87 10/01/2022 1432    Clinical Atherosclerotic Cardiovascular Disease (ASCVD):  The 10-year ASCVD risk score (Arnett DK, et al., 2019) is: 29.2%   Values used to calculate the score:     Age: 22 years     Sex: Male     Is Non-Hispanic African American: Yes     Diabetic: Yes     Tobacco smoker: No     Systolic Blood Pressure: 125 mmHg     Is BP treated: Yes     HDL Cholesterol: 51 mg/dL     Total Cholesterol: 162 mg/dL   Patient is participating in a Managed Medicaid Plan:  Yes   A/P: Diabetes longstanding and currently slightly improved based on recent A1C. Patient is able to verbalize appropriate hypoglycemia management plan and denies low readings or symptoms since stopping Ozempic. Medication adherence appears good. Control is remains suboptimal due to variation in activity and diet. -Continued basal insulin Lantus(insulin glargine) at  16 units daily in the morning.  -Continued rapid insulin Humalog (insulin lispro) at 6 units prior to two meals daily.  -Continued metformin 1000mg  BID.  - Spent majority of visit re-educating on use of CGM and placing new sensor - for use with READER -Patient educated on purpose, proper use, and potential adverse effects.  -Extensively discussed pathophysiology of diabetes, recommended lifestyle interventions, dietary effects on blood sugar control.  -Counseled on s/sx of and management of hypoglycemia.    Written patient instructions provided. Patient verbalized understanding of treatment plan.  Total time in face to face counseling  29 minutes.    Follow-up:  Pharmacist 2 weeks for CGM review and medication adjustment PCP clinic visit in PRN  Patient seen with Dr. Cyndia Skeeters, DO

## 2023-02-19 NOTE — Patient Instructions (Addendum)
Ilikuwa nzuri [place] leo!  Nimefurahi kusikia unajisikia vizuri  Lengo lako la sukari ya damu ni 80-130 kabla ya kula na chini ya 180 baada ya kula.  Mabadiliko ya [place]: Leo tumeanzisha kitambuzi chako cha kufuatilia glukosi.   Msomaji wako anapaswa kuwa karibu nawe au mfukoni mwako siku nzima.  Tutapitia matokeo baada [employer]   Endelea dawa zingine zote sawa.  Nimejaza tena insulini yako ya Lantus leo - duka la dawa liliombwa likuletee.   Fuatilia sukari ya damu nyumbani na uweke logi (glukometa au kipande cha [place]) ili uje nawe kwenye ziara yako inayofuata.  Endelea na kazi nzuri na lishe na mazoezi. Lengo la chakula kilichojaa mboga [place], [place] na nyama [place], bata [place], [place] Jaribu kupunguza ulaji wa chumvi kwa kula mboga safi au zilizogandishwa (badala ya [employer]), suuza [place] za makopo kabla ya [place] na usiongeze chumvi yoyote kwenye milo.  It was nice to see you today!  Glad to hear you are feeling well  Your goal blood sugar is 80-130 before eating and less than 180 after eating.  Medication Changes: Today we started your glucose monitoring sensor.   Your reader should be near you or in your pocket throughout the day.  We will review the results in two weeks.   Continue all other medication the same.  I refilled your Lantus insulin today - the pharmacy was asked to deliver it to you.   Monitor blood sugars at home and keep a log (glucometer or piece of paper) to bring with you to your next visit.  Keep up the good work with diet and exercise. Aim for a diet full of vegetables, fruit and lean meats (chicken, [place], fish). Try to limit salt intake by eating fresh or frozen vegetables (instead of canned), rinse canned vegetables prior to cooking and do not add any additional salt to meals.

## 2023-02-20 NOTE — Progress Notes (Signed)
Reviewed and agree with Dr Koval's plan.   

## 2023-02-26 ENCOUNTER — Ambulatory Visit: Payer: Self-pay | Admitting: Student

## 2023-03-05 ENCOUNTER — Ambulatory Visit: Payer: Medicare Other | Admitting: Pharmacist

## 2023-03-05 ENCOUNTER — Encounter: Payer: Self-pay | Admitting: Pharmacist

## 2023-03-05 ENCOUNTER — Encounter: Payer: Self-pay | Admitting: *Deleted

## 2023-03-05 VITALS — Wt 165.0 lb

## 2023-03-05 DIAGNOSIS — Z794 Long term (current) use of insulin: Secondary | ICD-10-CM

## 2023-03-05 DIAGNOSIS — E1169 Type 2 diabetes mellitus with other specified complication: Secondary | ICD-10-CM | POA: Diagnosis not present

## 2023-03-05 DIAGNOSIS — E118 Type 2 diabetes mellitus with unspecified complications: Secondary | ICD-10-CM

## 2023-03-05 MED ORDER — HUMALOG KWIKPEN 200 UNIT/ML ~~LOC~~ SOPN: 6.0000 [IU] | PEN_INJECTOR | Freq: Two times a day (BID) | SUBCUTANEOUS | 3 refills | Status: DC

## 2023-03-05 MED ORDER — LANTUS SOLOSTAR 100 UNIT/ML ~~LOC~~ SOPN: 15.0000 [IU] | PEN_INJECTOR | Freq: Every day | SUBCUTANEOUS | 5 refills | Status: DC

## 2023-03-05 NOTE — Assessment & Plan Note (Signed)
Diabetes longstanding and slightly improved based CGM report however high variability with both high readings and several low readings is concerning. Patient is able to verbalize appropriate hypoglycemia management plan.  Medication adherence appears good. Control remains suboptimal due to variation in activity and diet combined with limited variation in prandial dosing.  -Reduced basal insulin Lantus(insulin glargine) from 16 to 15 units daily in the morning.  -Adjusted rapid insulin Humalog (insulin lispro) from 6 units prior to two meals daily TO 8 units with early meal and 6 units with later meal of the day.   -Continued metformin 1000mg  BID.  -Spent significant amount > 50% of visit educating on CGM report and insulin dose adjustment  -Patient educated on purpose, proper use, and potential adverse effects.  -Extensively discussed pathophysiology of diabetes, recommended lifestyle interventions, dietary effects on blood sugar control.

## 2023-03-05 NOTE — Progress Notes (Signed)
S:     Chief Complaint  Patient presents with   Medication Management    Diabetes   67 y.o. male who presents for diabetes evaluation, education, and management. Patient arrives in good spirits and presents without any assistance. Patient speaks Swahili.  Entire visit conducted with video translator Marcial Pacas [phone] followed by Madelaine Bhat [phone]  Patient was referred and last seen by Primary Care Provider, Dr. Barbaraann Faster , on 02/12/2023.    PMH is significant for T2DM, HLD, HTN, and neuropathy.  At last visit, CGM sensor use was clarified and patient established use.    Current diabetes medications include: metformin 1000 mg twice daily with meal, Lantus (insulin glargine) 16 units once daily , Humalog (insulin lispro) 6 units twice daily and metformin 1000mg  BID.  No longer taking Ozempic (semaglutide).   Patient reports adherence to taking all medications as prescribed.  Appears to have all necessary medication supply.   Do you feel that your medications are working for you? yes Have you been experiencing any side effects to the medications prescribed? no  Patient denies hypoglycemic events.  Patient reports decreased nocturia (nighttime urination).  Patient reports improved control of neuropathy (nerve pain) with gabapentin.   O:   Review of Systems  All other systems reviewed and are negative.   Physical Exam Constitutional:      Appearance: Normal appearance.  Pulmonary:     Effort: Pulmonary effort is normal.  Neurological:     Mental Status: He is alert. Mental status is at baseline.  Psychiatric:        Mood and Affect: Mood normal.        Behavior: Behavior normal.        Thought Content: Thought content normal.       Libre3 CGM Download today 03/05/2023  % Time CGM is active: 94% Average Glucose: 233 mg/dL Glucose Management Indicator: 8.9  Glucose Variability: 44.4% (goal <36%) Time in Goal:  - Time in range 70-180: 32% - Time above range: 64% with 44% >  250 - Time below range: 4% Observed patterns: several lows overnight.  High post-prandial most obvious after early day meal.    Lab Results  Component Value Date   HGBA1C 8.5 (A) 02/12/2023    Lipid Panel     Component Value Date/Time   CHOL 162 10/01/2022 1432   TRIG 134 10/01/2022 1432   HDL 51 10/01/2022 1432   CHOLHDL 3.2 10/01/2022 1432   CHOLHDL 5.8 (H) 06/03/2015 1640   VLDL 34 (H) 06/03/2015 1640   LDLCALC 87 10/01/2022 1432    Clinical Atherosclerotic Cardiovascular Disease (ASCVD):  The 10-year ASCVD risk score (Arnett DK, et al., 2019) is: 29.2%   Values used to calculate the score:     Age: 10 years     Sex: Male     Is Non-Hispanic African American: Yes     Diabetic: Yes     Tobacco smoker: No     Systolic Blood Pressure: 125 mmHg     Is BP treated: Yes     HDL Cholesterol: 51 mg/dL     Total Cholesterol: 162 mg/dL   Patient is participating in a Managed Medicaid Plan:  Yes   A/P: Diabetes longstanding and slightly improved based CGM report however high variability with both high readings and several low readings is concerning. Patient is able to verbalize appropriate hypoglycemia management plan.  Medication adherence appears good. Control remains suboptimal due to variation in activity and diet combined with  limited variation in prandial dosing.  -Reduced basal insulin Lantus(insulin glargine) from 16 to 15 units daily in the morning.  -Adjusted rapid insulin Humalog (insulin lispro) from 6 units prior to two meals daily TO 8 units with early meal and 6 units with later meal of the day.   -Continued metformin 1000mg  BID.  -Spent significant amount > 50% of visit educating on CGM report and insulin dose adjustment  -Patient educated on purpose, proper use, and potential adverse effects.  -Extensively discussed pathophysiology of diabetes, recommended lifestyle interventions, dietary effects on blood sugar control.    Written patient instructions provided.  Patient verbalized understanding of treatment plan.  Total time in face to face counseling 31 minutes.    Follow-up:  Pharmacist 04/04/2023 PCP clinic visit in early 2025

## 2023-03-05 NOTE — Congregational Nurse Program (Signed)
  Dept: [phone]   Congregational Nurse Program Note  Date of Encounter: 03/05/2023  Past Medical History: Past Medical History:  Diagnosis Date   Alcohol abuse    Diabetes mellitus without complication (HCC)    Hypertension     Encounter Details:  Community Questionnaire - 03/05/23 1449       Questionnaire   Ask client: Do you give verbal consent for me to treat you today? Yes    Student Assistance N/A    Location Patient Served  [facility]    Encounter Setting CN site    Population Status Migrant/Refugee    Insurance Medicare    Insurance/Financial Assistance Referral N/A    Medication Have Medication Insecurities    Medical Provider Yes    Screening Referrals Made N/A    Medical Referrals Made N/A    Medical Appointment Completed N/A    CNP Interventions Advocate/Support;Educate;Counsel    Screenings CN Performed Blood Pressure;Blood Glucose    ED Visit Averted Yes    Life-Saving Intervention Made N/A            client came into nurse clinic today.  BP 126/75 HR 88 and his fingerstick glucose was 316.  I discussed with him that drinking soda is not good for him because it increases his blood sugar.  We discussed eating a small snack if he wants to eat something.  Client to follow up with this CN in a couple weeks as needed.  Roderic Palau, RN, MSN, CNP [phone] Office [phone] Cell

## 2023-03-05 NOTE — Patient Instructions (Signed)
It was nice to see you today!  Communicated through interpreter.    Medication Changes:  Increase Humalog to 8 units with early meal and continue late meal 6 units.   Decrease Lantus from 16 to 15 units daily.

## 2023-03-07 NOTE — Progress Notes (Signed)
Reviewed and agree with Dr Koval's plan.   

## 2023-04-04 ENCOUNTER — Ambulatory Visit: Payer: Medicare Other | Admitting: Pharmacist

## 2023-04-04 ENCOUNTER — Encounter: Payer: Self-pay | Admitting: Pharmacist

## 2023-04-04 VITALS — BP 118/76 | HR 86 | Wt 168.0 lb

## 2023-04-04 DIAGNOSIS — E118 Type 2 diabetes mellitus with unspecified complications: Secondary | ICD-10-CM | POA: Diagnosis not present

## 2023-04-04 DIAGNOSIS — Z794 Long term (current) use of insulin: Secondary | ICD-10-CM

## 2023-04-04 DIAGNOSIS — E1169 Type 2 diabetes mellitus with other specified complication: Secondary | ICD-10-CM | POA: Diagnosis not present

## 2023-04-04 MED ORDER — LANTUS SOLOSTAR 100 UNIT/ML ~~LOC~~ SOPN: 16.0000 [IU] | PEN_INJECTOR | Freq: Every day | SUBCUTANEOUS | 3 refills | Status: DC

## 2023-04-04 MED ORDER — INSULIN PEN NEEDLE 31G X 4 MM MISC: 1.0000 | 99 refills | Status: DC | PRN

## 2023-04-04 MED ORDER — HUMALOG KWIKPEN 200 UNIT/ML ~~LOC~~ SOPN
6.0000 [IU] | PEN_INJECTOR | Freq: Two times a day (BID) | SUBCUTANEOUS | 3 refills | Status: DC
Start: 2023-04-04 — End: 2023-05-23

## 2023-04-04 NOTE — Patient Instructions (Addendum)
Ilikuwa nzuri [place] leo! Kumbuka kuleta ufuatiliaji wa CGM kwa ziara inayofuata!  Lengo lako la sukari ya damu ni 80-130 kabla ya kula na chini ya 180 baada [place].  Mabadiliko ya D[place]: Endelea dawa zingine zote sawa.   Fuatilia sukari ya damu nyumbani na uweke logi (glukometa au kipande cha [place]) ili uje nawe kwenye ziara yako inayofuata.  Endelea na kazi nzuri na lishe na mazoezi. Lengo la chakula kilichojaa mboga [place], [place] na nyama [place] ([place], bata [place], [place]). Jaribu kupunguza ulaji wa chumvi kwa kula mboga safi au zilizogandishwa (badala ya [employer]), suuza [place] za makopo kabla ya [place] na usiongeze chumvi yoyote kwenye milo.  It was nice to see you today! Remember to bring CGM monitor to next visit!  Your goal blood sugar is 80-130 before eating and less than 180 after eating.  Medication Changes: Continue all other medication the same.   Monitor blood sugars at home and keep a log (glucometer or piece of paper) to bring with you to your next visit.  Keep up the good work with diet and exercise. Aim for a diet full of vegetables, fruit and lean meats (chicken, [place], fish). Try to limit salt intake by eating fresh or frozen vegetables (instead of canned), rinse canned vegetables prior to cooking and do not add any additional salt to meals.

## 2023-04-04 NOTE — Progress Notes (Signed)
S:     Chief Complaint  Patient presents with   Medication Management    Diabetes   68 y.o. male who presents for diabetes evaluation, education, and management. Patient arrives in good spirits and presents without any assistance. Patient is accompanied by his interpreter Izora Gala.   Patient was referred and last seen by Primary Care Provider, Dr. Barbaraann Faster, on 02/12/23.   PMH is significant for diabetes Type 2, HTN, HLD, and neuropathy.   At last visit, reduced basal insulin Lantus (insulin glargine) from 16 to 15 units daily in the morning. Adjusted rapid insulin Humalog (insulin lispro) from 6 units prior to two meals daily TO 8 units with early meal and 6 units with later meal of the day.  Continued metformin 1000mg  BID. Spent significant amount > 50% of visit educating on CGM report and insulin dose adjustment.   Current diabetes medications include: Lantus (insulin glargine) 16 units once daily, Humalog (insulin lispro) 9 units with early meal and 6 units with late meal, metformin 1000 mg BID Current hypertension medications include: Lisinopril 2.5 mg Current hyperlipidemia medications include: Atorvastatin 40 mg  Patient reports adherence to taking all medications as prescribed.   Do you feel that your medications are working for you? yes Have you been experiencing any side effects to the medications prescribed? no Insurance coverage: Medicare  Patient denies hypoglycemic events. Has seen readings as low as mid to high 70s - usually no lower than 80s.  Reported home fasting blood sugars: 100s   Patient reports nocturia (nighttime urination). 1-2x Patient denies neuropathy (nerve pain). Patient reports visual changes. Some blurry vision when reading small text, wears glasses. Patient reports self foot exams.   Patient reported dietary habits: Eats 3 meals/day Breakfast: Tea, bread Lunch: 1pm - African foods (foufou, beans, etc.) Dinner: 7pm - African foods ^ Snacks:  poridge Drinks: water  Patient-reported exercise habits: walk, runs (~6 days/week)  O:   Review of Systems  Eyes:  Positive for blurred vision (when reading).  Musculoskeletal:  Positive for back pain.  All other systems reviewed and are negative.  Physical Exam Vitals reviewed.  Constitutional:      Appearance: Normal appearance.  Pulmonary:     Effort: Pulmonary effort is normal.  Neurological:     General: No focal deficit present.     Mental Status: He is alert.  Psychiatric:        Mood and Affect: Mood normal.        Behavior: Behavior normal.        Thought Content: Thought content normal.        Judgment: Judgment normal.    Forgot CGM Libre receiver at home - could not download readings.  Lab Results  Component Value Date   HGBA1C 8.5 (A) 02/12/2023   Vitals:   04/04/23 0946 04/04/23 1001  BP: 123/69 118/76  Pulse: 86   SpO2: 100%     Lipid Panel     Component Value Date/Time   CHOL 162 10/01/2022 1432   TRIG 134 10/01/2022 1432   HDL 51 10/01/2022 1432   CHOLHDL 3.2 10/01/2022 1432   CHOLHDL 5.8 (H) 06/03/2015 1640   VLDL 34 (H) 06/03/2015 1640   LDLCALC 87 10/01/2022 1432    Clinical Atherosclerotic Cardiovascular Disease (ASCVD): Yes  The 10-year ASCVD risk score (Arnett DK, et al., 2019) is: 27.4%   Values used to calculate the score:     Age: 68 years     Sex: Male  Is Non-Hispanic African American: Yes     Diabetic: Yes     Tobacco smoker: No     Systolic Blood Pressure: 118 mmHg     Is BP treated: Yes     HDL Cholesterol: 51 mg/dL     Total Cholesterol: 162 mg/dL   Patient is participating in a Managed Medicaid Plan:  Yes   A/P: Diabetes longstanding with improved control. Forgot CGM receiver at home - unable to assess recent readings. Patient noted fatigue and neuropathy symptom improvement. Overall feels better. Patient is able to verbalize appropriate hypoglycemia management plan. Medication adherence appears good. Control is  optimal likely due to CGM. Reminded him to bring to next visit -Continued basal insulin Lantus (insulin glargine) 16 units daily in the morning.  -Continued rapid insulin Humalog (insulin lispro)  9 units with early meal and 6 units with late meal -Continued metformin 1000 mg BID.  -Patient educated on purpose, proper use, and potential adverse effects.  -Extensively discussed pathophysiology of diabetes, recommended lifestyle interventions, dietary effects on blood sugar control.  -Counseled on s/sx of and management of hypoglycemia.   ASCVD risk - primary prevention in patient with diabetes. Last LDL (10/01/22) is 87 not at goal of <70 mg/dL.  - Continued atorvastatin 40 mg once daily - consider dose escalation at next visit  Hypertension longstanding currently controlled. Blood pressure goal of <130/80 mmHg. Medication adherence optimal.  - Continued lisinopril 2.5 mg once daily.  Written patient instructions provided. Patient verbalized understanding of treatment plan.  Total time in face to face counseling 38 minutes.    Follow-up:  Pharmacist 05/17/23 Patient seen with Lavona Mound, PharmD Candidate and Laqueta Jean, PharmD Candidate.

## 2023-04-04 NOTE — Assessment & Plan Note (Signed)
Diabetes longstanding with improved control. Forgot CGM receiver at home - unable to assess recent readings. Patient noted fatigue and neuropathy symptom improvement. Overall feels better. Patient is able to verbalize appropriate hypoglycemia management plan. Medication adherence appears good. Control is optimal likely due to CGM. Reminded him to bring to next visit -Continued basal insulin Lantus (insulin glargine) 16 units daily in the morning.  -Continued rapid insulin Humalog (insulin lispro)  9 units with early meal and 6 units with late meal -Continued metformin 1000 mg BID.  -Patient educated on purpose, proper use, and potential adverse effects.  -Extensively discussed pathophysiology of diabetes, recommended lifestyle interventions, dietary effects on blood sugar control.  -Counseled on s/sx of and management of hypoglycemia.

## 2023-04-08 NOTE — Progress Notes (Signed)
Reviewed and agree with Dr Koval's plan.   

## 2023-04-09 ENCOUNTER — Other Ambulatory Visit (HOSPITAL_COMMUNITY): Payer: Self-pay

## 2023-05-06 ENCOUNTER — Other Ambulatory Visit: Payer: Self-pay | Admitting: Student

## 2023-05-06 DIAGNOSIS — E118 Type 2 diabetes mellitus with unspecified complications: Secondary | ICD-10-CM

## 2023-05-17 ENCOUNTER — Ambulatory Visit: Payer: Medicare Other | Admitting: Pharmacist

## 2023-05-17 ENCOUNTER — Encounter: Payer: Self-pay | Admitting: Pharmacist

## 2023-05-17 VITALS — BP 116/89 | HR 85 | Wt 168.0 lb

## 2023-05-17 DIAGNOSIS — E1169 Type 2 diabetes mellitus with other specified complication: Secondary | ICD-10-CM

## 2023-05-17 DIAGNOSIS — Z794 Long term (current) use of insulin: Secondary | ICD-10-CM

## 2023-05-17 LAB — POCT GLYCOSYLATED HEMOGLOBIN (HGB A1C): HbA1c, POC (controlled diabetic range): 12.9 % — AB (ref 0.0–7.0)

## 2023-05-17 MED ORDER — INSULIN PEN NEEDLE 31G X 4 MM MISC: 1.0000 | 99 refills | Status: DC | PRN

## 2023-05-17 MED ORDER — FREESTYLE LIBRE 3 PLUS SENSOR MISC: 11 refills | Status: DC

## 2023-05-17 NOTE — Assessment & Plan Note (Signed)
 Diabetes longstanding currently worsened based on A1c 12.9% today. Patient is able to verbalize appropriate hypoglycemia management plan. Medication adherence appears unclear. Control is suboptimal due to medication adherence and lack of monitoring.  -Continued basal insulin Lantus (insulin glargine) 16 units daily in the morning.  -Continued rapid insulin Humalog (insulin lispro) 8 units in the earlier meal and 6 units in the later meal -Continued metformin 1000 mg BID -A new Libre 3 Plus sensor was applied on patient's right arm.  New prescription sent to pharmacy and PA follow-up requested by CPhT.

## 2023-05-17 NOTE — Progress Notes (Signed)
 S:     Chief Complaint  Patient presents with   Medication Management    DM f/u   68 y.o. male who presents for diabetes evaluation, education, and management. Patient arrives in good spirits and presents without any assistance. Patient speaks Swahili and communicated through an interpreters;  Ahmed [phone] then [id number]  Patient was referred and last seen by Primary Care Provider, Dr. Barbaraann Faster, on 02/12/2023.   PMH is significant for Diabetes.  At last visit, patient reported doing well and appeared adherent to therapy.   Current diabetes medications include: Lantus (insulin glargine) 16 units daily, Humalog (insulin lispro) 8 units with early meal and 6 units with later meal of the day.  Current hypertension medications include: lisinopril 2.5 mg once daily Current hyperlipidemia medications include: atorvastatin 40 mg daily  Patient medication remains good, but it is challenging due to communication barrier  Patient denies hypoglycemic events.  O:   Review of Systems  All other systems reviewed and are negative.   Physical Exam Vitals reviewed.  Constitutional:      Appearance: Normal appearance.  Pulmonary:     Effort: Pulmonary effort is normal.  Neurological:     Mental Status: He is alert.  Psychiatric:        Mood and Affect: Mood normal.        Behavior: Behavior normal.     Patient reports that his reader lost charge and he has not recharged it lately.  He has cable and it was charged during visit - appears working encouraged to use.   He reports no sensors in his possession - states the pharmacy did not send them.    Libre3 CGM Download today 03/19/2023  (READER was not available at 04/04/2023 visit) % Time CGM is active: 97% Average Glucose: 202 mg/dL Glucose Management Indicator: 8.1  Glucose Variability: 56% (goal <36%) Time in Goal:  - Time in range 70-180: 40% - Time above range: 59% - Time below range: 11% Appears he has not used CGM in the  last two months.    Lab Results  Component Value Date   HGBA1C 12.9 (A) 05/17/2023   Vitals:   05/17/23 1048  BP: 116/89  Pulse: 85  SpO2: 100%    Lipid Panel     Component Value Date/Time   CHOL 162 10/01/2022 1432   TRIG 134 10/01/2022 1432   HDL 51 10/01/2022 1432   CHOLHDL 3.2 10/01/2022 1432   CHOLHDL 5.8 (H) 06/03/2015 1640   VLDL 34 (H) 06/03/2015 1640   LDLCALC 87 10/01/2022 1432    Clinical Atherosclerotic Cardiovascular Disease (ASCVD): Yes  The 10-year ASCVD risk score (Arnett DK, et al., 2019) is: 26.7%   Values used to calculate the score:     Age: 68 years     Sex: Male     Is Non-Hispanic African American: Yes     Diabetic: Yes     Tobacco smoker: No     Systolic Blood Pressure: 116 mmHg     Is BP treated: Yes     HDL Cholesterol: 51 mg/dL     Total Cholesterol: 162 mg/dL   Patient is participating in a Managed Medicaid Plan:  Yes   A/P: Diabetes longstanding currently worsened based on A1c 12.9% today. Patient is able to verbalize appropriate hypoglycemia management plan. Medication adherence appears unclear. Control is suboptimal due to medication adherence and lack of monitoring.  -Continued basal insulin Lantus (insulin glargine) 16 units daily in the  morning.  -Continued rapid insulin Humalog (insulin lispro) 8 units in the earlier meal and 6 units in the later meal -Continued metformin 1000 mg BID -A new Libre 3 Plus sensor was applied on patient's right arm.  New prescription sent to pharmacy and PA follow-up requested by CPhT.  -Patient educated on purpose, proper use, and potential adverse effects.  -Extensively discussed pathophysiology of diabetes, recommended lifestyle interventions, dietary effects on blood sugar control.  -Counseled on s/sx of and management of hypoglycemia.  -Next A1c anticipated May 2025.   ASCVD risk - primary prevention in patient with diabetes. Last LDL is 87 not at goal of <70 mg/dL. ASCVD risk factors include  diabetes, HTN, age and 10-year ASCVD risk score of 27.4%.  -Continued atorvastatin 40 mg daily  Hypertension longstanding currently improved. Blood pressure goal of <130/80 mmHg. Medication adherence good.  -Continued  lisinopril 2.5 mg once daily.  Written patient instructions provided. Patient verbalized understanding of treatment plan.  Total time in face to face counseling 36 minutes.    Follow-up:  Pharmacist 03/14 at 9:30 a.m. PCP clinic visit in TBD Patient seen with Lavona Mound, PharmD Candidate

## 2023-05-17 NOTE — Patient Instructions (Addendum)
 Ilikuwa nzuri [place] leo!  Lengo lako la sukari ya damu ni 80-130 kabla ya kula na chini ya 180 baada [place].  Mabadiliko ya Dawa:  Endelea dawa zingine zote sawa.   Endelea na kazi nzuri na lishe na mazoezi. Lengo la chakula kilichojaa mboga [place], [place] na nyama [place] ([place], bata [place], [place]). Jaribu kupunguza ulaji wa chumvi kwa kula mboga safi au zilizogandishwa (badala ya [place]), suuza [place] za makopo kabla ya [place] na usiongeze chumvi yoyote kwenye milo.  It was nice to see you today!  Your goal blood sugar is 80-130 before eating and less than 180 after eating.  Medication Changes:  Continue all other medication the same.   Keep up the good work with diet and exercise. Aim for a diet full of vegetables, fruit and lean meats (chicken, [place], fish). Try to limit salt intake by eating fresh or frozen vegetables (instead of canned), rinse canned vegetables prior to cooking and do not add any additional salt to meals.

## 2023-05-20 ENCOUNTER — Other Ambulatory Visit (HOSPITAL_COMMUNITY): Payer: Self-pay

## 2023-05-20 NOTE — Progress Notes (Signed)
 Reviewed and agree with Dr Macky Lower plan.

## 2023-05-21 ENCOUNTER — Other Ambulatory Visit (HOSPITAL_COMMUNITY): Payer: Self-pay

## 2023-05-21 ENCOUNTER — Telehealth: Payer: Self-pay

## 2023-05-21 NOTE — Telephone Encounter (Signed)
 Pharmacy Patient Advocate Encounter   Received notification from Physician's Office that prior authorization for FREESTYLE LIBRE 3 PLUS SENSORS is required/requested.   Insurance verification completed.   The patient is insured through [employer] .   PA required; PA submitted to above mentioned insurance via Fax Key/confirmation #/[id number]. Status is pending

## 2023-05-23 ENCOUNTER — Other Ambulatory Visit (HOSPITAL_COMMUNITY): Payer: Self-pay

## 2023-05-23 ENCOUNTER — Other Ambulatory Visit: Payer: Self-pay | Admitting: Student

## 2023-05-23 ENCOUNTER — Telehealth: Payer: Self-pay | Admitting: Pharmacist

## 2023-05-23 ENCOUNTER — Telehealth: Payer: Self-pay

## 2023-05-23 DIAGNOSIS — E118 Type 2 diabetes mellitus with unspecified complications: Secondary | ICD-10-CM

## 2023-05-23 MED ORDER — BASAGLAR KWIKPEN 100 UNIT/ML ~~LOC~~ SOPN: 16.0000 [IU] | PEN_INJECTOR | SUBCUTANEOUS | 11 refills | Status: DC

## 2023-05-23 MED ORDER — NOVOLOG FLEXPEN 100 UNIT/ML ~~LOC~~ SOPN: 6.0000 [IU] | PEN_INJECTOR | Freq: Two times a day (BID) | SUBCUTANEOUS | 11 refills | Status: DC

## 2023-05-23 NOTE — Telephone Encounter (Signed)
 Pharmacy Patient Advocate Encounter   Received notification from CoverMyMeds that prior authorization for HUAMLOG is required/requested.   Insurance verification completed.   The patient is insured through [employer] .   NOVOLOG is preferred by the insurance.  If suggested medication is appropriate, Please send in a new RX and discontinue this one. If not, please advise as to why it's not appropriate so that we may request a Prior Authorization. Please note, some preferred medications may still require a PA.  If the suggested medications have not been trialed and there are no contraindications to their use, the PA will not be submitted, as it will not be approved.

## 2023-05-23 NOTE — Telephone Encounter (Signed)
 Issue with  CGM coverage with insurance.   05/17/2023 - communicated with Siri Cole, CPhT Appears he did not get PA for [facility] 3 sensors approved yet this year. I have sent new Rx for Libre3+ sensors. Asked her to review and ensure the [facility] 3+ are processed.   05/21/2023 appears Part B will need to be processed   05/23/2023 - contacted pharmacy to assess if secondary insurance could be used.  This did not work.   Patient will need to process through [facility] or alternate pharmacy that submits Part B claims.    Patient has visit 3/14 (next week with me). I will provide sample sensor and explain sensor plan / need to use alternate pharmacy with him at that time.

## 2023-05-23 NOTE — Telephone Encounter (Signed)
 Pharmacy Patient Advocate Encounter  Received notification from [facility] MEDIC[employer] that Prior Authorization for FREESTYLE LIBRE 3 PLUS SENSORS has been DENIED.  Full denial letter will be uploaded to the media tab. See denial reason below.    Pharmacy should bill to part B. Will call to inform.

## 2023-05-23 NOTE — Progress Notes (Signed)
 Received message that lantus and humalog no longer covered by insurance, requesting basiglar and novolog. New Rx placed

## 2023-05-27 NOTE — Telephone Encounter (Signed)
 Reviewed and agree with Dr Macky Lower plan.

## 2023-05-31 ENCOUNTER — Ambulatory Visit: Payer: Medicare Other | Admitting: Pharmacist

## 2023-06-03 ENCOUNTER — Encounter: Payer: Self-pay | Admitting: Pharmacist

## 2023-06-03 ENCOUNTER — Ambulatory Visit (INDEPENDENT_AMBULATORY_CARE_PROVIDER_SITE_OTHER): Admitting: Pharmacist

## 2023-06-03 VITALS — BP 119/83 | HR 84 | Wt 163.8 lb

## 2023-06-03 DIAGNOSIS — Z794 Long term (current) use of insulin: Secondary | ICD-10-CM | POA: Diagnosis not present

## 2023-06-03 DIAGNOSIS — E1169 Type 2 diabetes mellitus with other specified complication: Secondary | ICD-10-CM

## 2023-06-03 NOTE — Progress Notes (Signed)
 S:     Chief Complaint  Patient presents with   Medication Management    Diabetes Follow Up   68 y.o. male who presents for diabetes evaluation, education, and management. Patient arrives in good spirits and presents without any assistance. A translator was used for this visit, Ar[place]us Ped [phone]).  Patient was last seen by me on 05/17/2023.  PMH is significant for T2DM.  At last visit, patient was switched to Lantus (insulin glargine) 16 units and Novolog (insulin aspart) 8 units with the first meal and 6 units with the later meal. Patient was switched due to insurance coverage.  Current diabetes medications include: Lantus (insulin glargine) 16 units and Novolog (insulin aspart) 8 units with the first meal and 6 units with the later meal, metformin 1000 mg bid Current hypertension medications include: lisinopril 2.5 mg daily Current hyperlipidemia medications include: atorvastatin 40 mg daily   Patient denies adherence with medications, reports missing Humalog (insulin lispro) twice per week, on average. He is fearful of being able to drive with low blood sugar. I reassured the patient that if his BG >100 he should be able to drive without complications.  Insurance coverage: Medicare and Medicaid secondary CGM shows 2 nights of hypoglycemia but patient denies symptoms.  Patient reported dietary habits: Eats 2 meals/day Breakfast: smaller meal Dinner: larger meal  O:   Review of Systems  All other systems reviewed and are negative.   Physical Exam Vitals reviewed.  Pulmonary:     Effort: Pulmonary effort is normal.  Psychiatric:        Mood and Affect: Mood normal.        Thought Content: Thought content normal.     Libre3 CGM Download today 06/03/23 % Time CGM is active: 97% Average Glucose: 282 mg/dL Glucose Management Indicator: 10.1  Glucose Variability: 41.2% (goal <36%) Time in Goal:  - Time in range 70-180: 22% - Time above range: 74% - Time below range:  4% A new device was placed on the patients' right arm today Observed patterns: Hyperglycemia consistently with two overnight episodes of hypoglycemia.  Lab Results  Component Value Date   HGBA1C 12.9 (A) 05/17/2023   Vitals:   06/03/23 0914  BP: 119/83  Pulse: 84  SpO2: 100%    Lipid Panel     Component Value Date/Time   CHOL 162 10/01/2022 1432   TRIG 134 10/01/2022 1432   HDL 51 10/01/2022 1432   CHOLHDL 3.2 10/01/2022 1432   CHOLHDL 5.8 (H) 06/03/2015 1640   VLDL 34 (H) 06/03/2015 1640   LDLCALC 87 10/01/2022 1432    Clinical Atherosclerotic Cardiovascular Disease (ASCVD): No  The 10-year ASCVD risk score (Arnett DK, et al., 2019) is: 27.8%   Values used to calculate the score:     Age: 54 years     Sex: Male     Is Non-Hispanic African American: Yes     Diabetic: Yes     Tobacco smoker: No     Systolic Blood Pressure: 119 mmHg     Is BP treated: Yes     HDL Cholesterol: 51 mg/dL     Total Cholesterol: 162 mg/dL    A/P: Diabetes longstanding currently worsened control since last visit.  Medication adherence appears adherent with oral meds but not all doses of insulin.  Several overnight low readings with post-prandial highs.   -Decreased dose of basal insulin Lantus(insulin glargine) from 16 units to 14 units.  -Continued rapid insulin Novolog (insulin aspart) 8  units with the first meal and 6 units with the later meal - reinforced need for adherence.  -Continued metformin 1000 mg bid.  -Extensively discussed pathophysiology of diabetes, recommended lifestyle interventions, dietary effects on blood sugar control.  -Counseled on s/sx of and management of hypoglycemia. - Currently his CGM sensor is not able to be filled at his preferred pharmacy.  Attempted to explain need for alternate pharmacy to process - Medicare Part B claim for CGM - Libre 3+ which should be covered (patient is taking insulin).  We discussed working with Arman Bogus, congregational nurse to  identify and coordinate a pharmacy for his Medicare Part B claim.  - sent in basket message.     ASCVD risk - primary prevention in patient with diabetes. Last LDL is not at goal of <70 mg/dL. ASCVD risk factors include T2DM and HTN and 10-year ASCVD risk score of 26.7. High intensity statin indicated.  -Continued atorvastatin 40 mg  Hypertension currently controlled. Blood pressure goal of <130/80 mmHg. Medication adherence reports adherence.  -Continued lisinopril 2.5 mg daily  Written patient instructions provided. Patient verbalized understanding of treatment plan.  Total time in face to face counseling 42 minutes.    Follow-up:  Pharmacist 06/17/2023 Patient seen with Threasa Heads, PharmD Candidate and Mack Guise, PharmD Candidate.

## 2023-06-03 NOTE — Assessment & Plan Note (Signed)
 Diabetes longstanding currently worsened control since last visit.  Medication adherence appears adherent with oral meds but not all doses of insulin.  Several overnight low readings with post-prandial highs.   -Decreased dose of basal insulin Lantus(insulin glargine) from 16 units to 14 units.  -Continued rapid insulin Novolog (insulin aspart) 8 units with the first meal and 6 units with the later meal - reinforced need for adherence.  -Continued metformin 1000 mg bid.  -Extensively discussed pathophysiology of diabetes, recommended lifestyle interventions, dietary effects on blood sugar control.

## 2023-06-03 NOTE — Patient Instructions (Addendum)
 Punguza Basaglar (kalamu ya kijivu) hadi vitengo 14 kila siku  Endelea dawa zingine zote sawa.    Endelea na kazi nzuri na lishe na mazoezi. Lengo la chakula kilichojaa mboga [place], [place] na nyama [place] ([place], bata [place], [place]). Jaribu kupunguza ulaji wa chumvi kwa kula mboga safi au zilizogandishwa (badala ya [place]), suuza [place] za makopo kabla ya [place] na usiongeze chumvi yoyote kwenye milo. It was nice to see you today!  Your goal blood sugar is 80-130 before eating and less than 180 after eating.  Medication Changes:  Decrease Lantus (the gray pen) to 14 units daily  Continue all other medication the same.    Keep up the good work with diet and exercise. Aim for a diet full of vegetables, fruit and lean meats (chicken, [place], fish). Try to limit salt intake by eating fresh or frozen vegetables (instead of canned), rinse canned vegetables prior to cooking and do not add any additional salt to meals.

## 2023-06-06 NOTE — Congregational Nurse Program (Signed)
  Dept: [phone]   Congregational Nurse Program Note  Date of Encounter: 06/06/2023  Past Medical History: Past Medical History:  Diagnosis Date   Alcohol abuse    Diabetes mellitus without complication (HCC)    Hypertension     Encounter Details:  Community Questionnaire - 06/06/23 1730       Questionnaire   Ask client: Do you give verbal consent for me to treat you today? Yes    Student Assistance N/A    Location Patient Served  [place]    Encounter Setting Home    Population Status Migrant/Refugee    Insurance Medicare    Insurance/Financial Assistance Referral N/A    Medication Have Medication Insecurities    Medical Provider Yes    Screening Referrals Made N/A    Medical Referrals Made N/A    Medical Appointment Completed [facility]    CNP Interventions Advocate/Support;Educate;Counsel;Navigate Healthcare System;Case Management    Screenings CN Performed N/A    ED Visit Averted Yes    Life-Saving Intervention Made N/A           Home visit done. Patient and her daughter assisted to locate pharmacy closer to home.Patient drives but unable to use GPS correctly due to language barrier.Daughter is able to use GPS once the address is typed in. Address provided and both patient and daughter assisted to navigate using GPS.  I will continue to assist as needed.  Nicole Cella Eliga Arvie RN BSN PCCN  [facility] [phone] [phone]-office

## 2023-06-06 NOTE — Progress Notes (Signed)
 Reviewed and agree with Dr Macky Lower plan.

## 2023-06-07 ENCOUNTER — Other Ambulatory Visit: Payer: Self-pay | Admitting: Family Medicine

## 2023-06-07 ENCOUNTER — Telehealth: Payer: Self-pay | Admitting: Pharmacist

## 2023-06-07 DIAGNOSIS — Z794 Long term (current) use of insulin: Secondary | ICD-10-CM

## 2023-06-07 MED ORDER — FREESTYLE LIBRE 3 PLUS SENSOR MISC: 11 refills | Status: AC

## 2023-06-07 NOTE — Telephone Encounter (Signed)
 Reviewed and agree with Dr Macky Lower plan.

## 2023-06-07 NOTE — Telephone Encounter (Signed)
-----   Message from Nurse Kelli Churn sent at 06/06/2023  9:27 PM EDT ----- Regarding: RE: CGM supply - Libre 3+  Needs pharmacy that can process Medicare Part B claim Hi Dr Raymondo Band, [facility] [address] [facility] I showed both patient and daughter how to navigate using GPS. He understands to take his insurances card along. dorothy ----- Message ----- From: Kathrin Ruddy, RPH-CPP Sent: 06/05/2023   3:30 PM EDT To: Caren Griffins Muhoro, RN Subject: RE: CGM supply - Libre 3+  Needs pharmacy th#  Unfortunately, [facility] does not process the Part B claim either.   [facility].  We would need to have Mr. Georg Ruddle take his current insurance card to the pharmacy we choose.   Would either [facility]  [address]  [facility] [address] [facility] acceptable?  Apologies this is so challenging.  Please let me know his preference and when he may be able to go to that pharmacy to share his insurance information.  Thanks again. ----- Message ----- From: Debbora Presto, RN Sent: 06/04/2023  10:22 AM EDT To: Kathrin Ruddy, RPH-CPP; # Subject: RE: CGM supply - Libre 3+  Needs pharmacy th#  The [facility] close to his home. The same one his daughter uses.    [address] [employer]  [place],[id number]   [phone] ----- Message ----- From: Kathrin Ruddy, RPH-CPP Sent: 06/03/2023  11:18 AM EDT To: Caren Griffins Muhoro, RN; Otho Najjar, CPhT; # Subject: CGM supply - Libre 3+  Needs pharmacy that c#  Nicole Cella,   I believe you are familiar with this patient.  Mr. Blima Ledger has been using his Libre 3 CGM to help with his glucose control.   His recent change of insurance has lead to a problem.   His current pharmacy, [facility], does not process Medicare Part B claims for CGM.    He is on insulin and the CGM should be covered on his insurance - he is taking insulin.   I need help determining which pharmacy the patient can use for Medicare Part B claim  processing.  Can you please contact the patient and determine if there is a [facility], [facility] or [facility] we may be able to use for CGM prescription.    I believe the patient would prefer a pharmacy that delivers, but this may be difficult.    Please let me know which pharmacy is close to his home or which one he prefers and we can then try to send prescription to that location.  Thanks  Paulino Rily, PharmD

## 2023-06-07 NOTE — Telephone Encounter (Signed)
 New prescription for [facility] 3 +[facility]   Patient aware that he will need to take insurance card to that pharmacy.   Patient is taking basal and bolus insulin.

## 2023-06-17 ENCOUNTER — Ambulatory Visit: Admitting: Pharmacist

## 2023-06-17 ENCOUNTER — Other Ambulatory Visit: Payer: Self-pay | Admitting: Family Medicine

## 2023-06-17 DIAGNOSIS — E785 Hyperlipidemia, unspecified: Secondary | ICD-10-CM

## 2023-06-28 ENCOUNTER — Ambulatory Visit: Admitting: Pharmacist

## 2023-06-28 ENCOUNTER — Encounter: Payer: Self-pay | Admitting: Pharmacist

## 2023-06-28 VITALS — BP 118/87 | HR 87 | Wt 160.4 lb

## 2023-06-28 DIAGNOSIS — E782 Mixed hyperlipidemia: Secondary | ICD-10-CM

## 2023-06-28 DIAGNOSIS — I10 Essential (primary) hypertension: Secondary | ICD-10-CM | POA: Diagnosis not present

## 2023-06-28 DIAGNOSIS — E1169 Type 2 diabetes mellitus with other specified complication: Secondary | ICD-10-CM | POA: Diagnosis not present

## 2023-06-28 DIAGNOSIS — Z794 Long term (current) use of insulin: Secondary | ICD-10-CM | POA: Diagnosis not present

## 2023-06-28 NOTE — Assessment & Plan Note (Signed)
 Diabetes longstanding currently uncontrolled with most recent A1c at 12.9%. Patient is able to verbalize appropriate hypoglycemia management plan. Medication adherence appears partially adherent, with poor adherence to meformin. Control is difficult to assess due to trouble with obtaining Freestyle Libre sensors and medication adherence assessment with use of interpreter.  -Replaced Libre 3+ sensor on right arm - reconnected with his READER.  -Continued basal insulin Lantus (insulin glargine) at 14 units daily in the evening -Continued rapid insulin Humalog (insulin lispro) 8 units with morning meal and 6 units with evening meal -Continued metformin 1,000 mg BID -Will reassess diabetes management at next visit, depending on sensor results.

## 2023-06-28 NOTE — Patient Instructions (Addendum)
 Ilikuwa nzuri [place] leo!  Lengo lako la sukari ya damu ni 80-130 kabla ya kula na chini ya 180 baada [place].  Mabadiliko ya Dawa:  [place]uch[place] metformin mara mbili kwa siku na chakula.  Endelea na dawa zingine zote kama ilivyopangwa.   It was nice to see you today!  Your goal blood sugar is 80-130 before eating and less than 180 after eating.  Medication Changes:  Take the metformin twice daily with food.  Continue all other medication as planned.

## 2023-06-28 NOTE — Progress Notes (Signed)
    S:     Chief Complaint  Patient presents with   Medication Management    Diabetes   68 y.o. male who presents for diabetes evaluation, education, and management. Patient arrives in good spirits and presents without any assistance. Visit completed using interpreter Onalee Hua #[id number].  Patient was referred and last seen by Primary Care Provider, Dr. Barbaraann Faster, on 02/12/2023.   PMH is significant for HTN, T2DM, HLD, lumbar compression fracture, head trauma.   Current diabetes medications include: metformin, insulin glargine and insulin lispro Current hypertension medications include: lisinopril Current hyperlipidemia medications include: atorvastatin  Patient denies adherence with medications, reports missing second dose of metformin daily.  Insurance coverage: Medicare/Medicaid  Patient denies hypoglycemic events.  Patient denies nocturia (nighttime urination).    O:   Libre3 CGM unable to be downloaded today due to expired/broken sensor.  [facility] Medicare (part B) Red/white and blue CARD for processing.  Following discussion with patient and pharmacy-plan for processing and pick-up tomorrow.    Lab Results  Component Value Date   HGBA1C 12.9 (A) 05/17/2023   Vitals:   06/28/23 0912  BP: 118/87  Pulse: 87  SpO2: 99%    Lipid Panel     Component Value Date/Time   CHOL 162 10/01/2022 1432   TRIG 134 10/01/2022 1432   HDL 51 10/01/2022 1432   CHOLHDL 3.2 10/01/2022 1432   CHOLHDL 5.8 (H) 06/03/2015 1640   VLDL 34 (H) 06/03/2015 1640   LDLCALC 87 10/01/2022 1432    Clinical Atherosclerotic Cardiovascular Disease (ASCVD): No  The 10-year ASCVD risk score (Arnett DK, et al., 2019) is: 27.4%   Values used to calculate the score:     Age: 5 years     Sex: Male     Is Non-Hispanic African American: Yes     Diabetic: Yes     Tobacco smoker: No     Systolic Blood Pressure: 118 mmHg     Is BP treated: Yes     HDL Cholesterol: 51  mg/dL     Total Cholesterol: 162 mg/dL     A/P: Diabetes longstanding currently uncontrolled with most recent A1c at 12.9%. Patient is able to verbalize appropriate hypoglycemia management plan. Medication adherence appears partially adherent, with poor adherence to meformin. Control is difficult to assess due to trouble with obtaining Freestyle Libre sensors and medication adherence assessment with use of interpreter.  -Replaced Libre 3+ sensor on right arm - reconnected with his READER.  -Continued basal insulin Lantus (insulin glargine) at 14 units daily in the evening -Continued rapid insulin Humalog (insulin lispro) 8 units with morning meal and 6 units with evening meal -Continued metformin 1,000 mg BID -Will reassess diabetes management at next visit, depending on sensor results. -Next A1c anticipated at next visit.   ASCVD risk - primary prevention in patient with diabetes. Last LDL is 59 at goal of <70 mg/dL. ASCVD risk factors include HTN, HLD and T2DM and 10-year ASCVD risk score of 27.4%. high intensity statin indicated.  -Continued atorvastatin 40 mg.   Hypertension longstanding currently controlled. Blood pressure goal of <130/80 mmHg. Medication adherence is good.  -Continued lisinopril 2.5 mg.  Written patient instructions provided. Patient verbalized understanding of treatment plan.  Total time in face to face counseling 37 minutes.    Follow-up:  Pharmacist on 07/12/2023 PCP clinic visit at needed. Patient seen with Threasa Heads, PharmD Candidate and Darolyn Rua, PharmD Candidate.

## 2023-06-28 NOTE — Assessment & Plan Note (Signed)
 Hypertension longstanding currently controlled. Blood pressure goal of <130/80 mmHg. Medication adherence is good.  -Continued lisinopril 2.5 mg.

## 2023-06-28 NOTE — Assessment & Plan Note (Signed)
 ASCVD risk - primary prevention in patient with diabetes. Last LDL is 59 at goal of <70 mg/dL. ASCVD risk factors include HTN, HLD and T2DM and 10-year ASCVD risk score of 27.4%. high intensity statin indicated.  -Continued atorvastatin 40 mg.

## 2023-07-01 NOTE — Progress Notes (Signed)
 Reviewed and agree with Dr Macky Lower plan.

## 2023-07-03 NOTE — Congregational Nurse Program (Signed)
  Dept: [phone]   Congregational Nurse Program Note  Date of Encounter: 07/03/2023  Past Medical History: Past Medical History:  Diagnosis Date   Alcohol abuse    Diabetes mellitus without complication (HCC)    Hypertension     Encounter Details:  Community Questionnaire - 07/03/23 1146       Questionnaire   Ask client: Do you give verbal consent for me to treat you today? Yes    Student Assistance N/A    Location Patient Served  [facility]    Encounter Setting Home    Population Status Migrant/Refugee    Insurance Medicare    Insurance/Financial Assistance Referral N/A    Medication Have Medication Insecurities    Medical Provider Yes    Screening Referrals Made N/A    Medical Referrals Made N/A    Medical Appointment Completed [facility]    CNP Interventions Advocate/Support;Educate;Counsel;Navigate Healthcare System;Case Management    Screenings CN Performed N/A    ED Visit Averted Yes    Life-Saving Intervention Made N/A           Patient assisted with transportation to go to pharmacy to get [facility] 3 glucose sensors. I assisted with language interpretation over phone. Pharmacy stated that supporting documents are needed prior to dispensing Libre 3 glucose sensors. Pharmacy will reach out to provider.  Perla Bradford Nelle Sayed RN BSN PCCN  [facility] [phone]-cell [phone]-office

## 2023-07-12 ENCOUNTER — Encounter: Payer: Self-pay | Admitting: Pharmacist

## 2023-07-12 ENCOUNTER — Ambulatory Visit: Admitting: Pharmacist

## 2023-07-12 VITALS — BP 122/70 | HR 91 | Wt 166.2 lb

## 2023-07-12 DIAGNOSIS — I10 Essential (primary) hypertension: Secondary | ICD-10-CM | POA: Diagnosis not present

## 2023-07-12 DIAGNOSIS — E782 Mixed hyperlipidemia: Secondary | ICD-10-CM | POA: Diagnosis not present

## 2023-07-12 DIAGNOSIS — Z794 Long term (current) use of insulin: Secondary | ICD-10-CM

## 2023-07-12 DIAGNOSIS — E1169 Type 2 diabetes mellitus with other specified complication: Secondary | ICD-10-CM | POA: Diagnosis not present

## 2023-07-12 MED ORDER — BASAGLAR KWIKPEN 100 UNIT/ML ~~LOC~~ SOPN: 14.0000 [IU] | PEN_INJECTOR | SUBCUTANEOUS | 11 refills | Status: DC

## 2023-07-12 MED ORDER — NOVOLOG FLEXPEN 100 UNIT/ML ~~LOC~~ SOPN
5.0000 [IU] | PEN_INJECTOR | Freq: Two times a day (BID) | SUBCUTANEOUS | 11 refills | Status: DC
Start: 2023-07-12 — End: 2023-12-30

## 2023-07-12 NOTE — Patient Instructions (Addendum)
 Ilikuwa nzuri [place] leo!  Lengo lako la sukari ya damu ni 80-130 kabla ya kula na chini ya 180 baada [place].  Angalia tena na duka lako la dawa la [facility] Mei kwa masasisho kuhusu kupata vitambuzi vyako vya Libre 3.  Mabadiliko ya Dawa: Punguza Lantus  kutoka vitengo 16 hadi vitengo 14 jioni.  Badilisha Novolog  kutoka vitengo 8 na mlo wako wa kwanza na uniti 6 na mlo wako wa pili, hadi uniti 10 na mlo wako wa kwanza na uniti 5 na mlo wako wa pili.  Endelea dawa zingine zote sawa.   Endelea na kazi nzuri na lishe na mazoezi. Lengo la chakula kilichojaa mboga [place], matunda na nyama konda ([place], bata mzinga, [place]). Jaribu kupunguza ulaji wa chumvi kwa kula mboga mpya au zilizogandishwa (badala ya [place] [employer]), suuza mboga za makopo kabla ya kupika na usiongeze chumvi yoyote kwenye milo.   It was nice to see you today!  Your goal blood sugar is 80-130 before eating and less than 180 after eating.  [facility] for updates on getting your [place] 3 sensors.  Medication Changes: Decrease Lantus  from 16 units to 14 units in the evening.  Change Novolog  from 8 units with your first meal and 6 units with your second meal, to 10 units with your first meal and 5 units with your second meal.  Continue all other medication the same.   Keep up the good work with diet and exercise. Aim for a diet full of vegetables, fruit and lean meats (chicken, [place], fish). Try to limit salt intake by eating fresh or frozen vegetables (instead of canned), rinse canned vegetables prior to cooking and do not add any additional salt to meals.

## 2023-07-12 NOTE — Progress Notes (Signed)
 S:     Chief Complaint  Patient presents with   Medication Management    Diabetes follow-up - CGM   68 y.o. male who presents for diabetes evaluation, education, and management. Patient arrives in good spirits and presents without any assistance. Patient is accompanied by translators, Portia Brittle [phone] and Merle Starcher [phone].   Patient was referred and last seen by Primary Care Provider, Dr. Tenna Fees, on 02/12/23.   PMH is significant for HTN, T2DM, HLD, lumbar compression, head trauma.  At last visit, replaced Libre 3 sensor and continued DM medication regimen.   Current diabetes medications include: Lantus  16 units daily in the evening, Novolog  8 units with first meal and 6 units with second meal, metformin  2000 mg Current hypertension medications include: lisinopril  2.5 mg Current hyperlipidemia medications include: atorvastatin  40 mg  Patient reports adherence to taking all medications as prescribed. Reports only taking medications brought in-office today, unsure if patient is taking Tylenol  or ibuprofen  at home.  Patient reports hypoglycemic events.  Patient reported dietary habits: Eats 2 meals/day First meal: potatoes, beans, pork, fish Second meal: cassava, fish Snacks: bananas Drinks: water  Patient reports unable to obtain [place] [facility] due to "missing documentation". Reports attempting to go to pharmacy twice with congregational nurse, Perla Bradford.  O:   Review of Systems  All other systems reviewed and are negative.   Physical Exam Vitals reviewed.  Constitutional:      Appearance: Normal appearance.  Pulmonary:     Effort: Pulmonary effort is normal.  Neurological:     Mental Status: He is alert.  Psychiatric:        Mood and Affect: Mood normal.        Behavior: Behavior normal.        Thought Content: Thought content normal.        Judgment: Judgment normal.    Libre3 CGM Download today 07/12/23 % Time CGM is active: 96% Average Glucose:  273 mg/dL Glucose Management Indicator: 9.8  Glucose Variability: 46.8% (goal <36%) Time in Goal:  - Time in range 70-180: 25% - Time above range: 69% - Time below range: 6% Observed patterns: High variability. Reports multiple hypoglycemic events overnight, with hyperglycemic events occurring in the late morning to evening.   Lab Results  Component Value Date   HGBA1C 12.9 (A) 05/17/2023   Vitals:   07/12/23 0948  BP: 122/70  Pulse: 91  SpO2: 99%    Lipid Panel     Component Value Date/Time   CHOL 162 10/01/2022 1432   TRIG 134 10/01/2022 1432   HDL 51 10/01/2022 1432   CHOLHDL 3.2 10/01/2022 1432   CHOLHDL 5.8 (H) 06/03/2015 1640   VLDL 34 (H) 06/03/2015 1640   LDLCALC 87 10/01/2022 1432    Clinical Atherosclerotic Cardiovascular Disease (ASCVD): No  The 10-year ASCVD risk score (Arnett DK, et al., 2019) is: 28.9%   Values used to calculate the score:     Age: 68 years     Sex: Male     Is Non-Hispanic African American: Yes     Diabetic: Yes     Tobacco smoker: No     Systolic Blood Pressure: 122 mmHg     Is BP treated: Yes     HDL Cholesterol: 51 mg/dL     Total Cholesterol: 162 mg/dL   A/P: Diabetes longstanding currently improved (GMI=9.8). Patient is able to verbalize appropriate hypoglycemia management plan. Medication adherence appears good. Control is suboptimal due to diet  and high variability as seen on [place] 3 report.  -Decreased dose of basal insulin  Lantus  (insulin  glargine) from 16 units to 14 units daily in the evening.  -Changed rapid insulin  Novolog  (insulin  aspart) from 8 units with first meal and 6 units with second meal to 10 units with first meal and 5 units with second meal.  -Continued metformin  1000 mg BID.  -Placed Libre 3+ sensor and connected to reader. -[facility] regarding missing [facility] for patient to obtain [facility] 3+ sensor supply. Plan to fax proper documentation later today.  Asked patient to follow-up with pharmacy  next week, on 5/1, for supply. -Patient educated on purpose, proper use, and potential adverse effects.  -Extensively discussed pathophysiology of diabetes, recommended lifestyle interventions, dietary effects on blood sugar control.  Patient verbalized current strategies with both diet and exercise plans to maintain, improved glucose control.  -Counseled on s/sx of and management of hypoglycemia.  -Next A1c anticipated May 2025.   ASCVD risk - primary prevention in patient with diabetes. Last LDL is 87 not at goal of <70 mg/dL. ASCVD risk factors include HTN, HLD and T2DM and 10-year ASCVD risk score of 27.4%. High intensity statin indicated.  -Continued atorvastatin  40 mg.  -Consider dose increase to 80mg  with next visit/refill.    Hypertension longstanding currently controlled. Blood pressure goal of <130/80 mmHg. Medication adherence is good.  -Continued lisinopril  2.5 mg.  Written patient instructions provided. Patient verbalized understanding of treatment plan.  Total time in face to face counseling 44 minutes.    Follow-up:  Pharmacist 09/02/23 PCP clinic visit in 08/05/23 Patient seen with Teofilo Fellers, PharmD Candidate.

## 2023-07-12 NOTE — Assessment & Plan Note (Signed)
 ASCVD risk - primary prevention in patient with diabetes. Last LDL is 87 not at goal of <70 mg/dL. ASCVD risk factors include HTN, HLD and T2DM and 10-year ASCVD risk score of 27.4%. High intensity statin indicated.  -Continued atorvastatin  40 mg.

## 2023-07-12 NOTE — Assessment & Plan Note (Signed)
 Diabetes longstanding currently improved (GMI=9.8). Patient is able to verbalize appropriate hypoglycemia management plan. Medication adherence appears good. Control is suboptimal due to diet and high variability as seen on [place] 3 report.  -Decreased dose of basal insulin  Lantus  (insulin  glargine) from 16 units to 14 units daily in the evening.  -Changed rapid insulin  Novolog  (insulin  aspart) from 8 units with first meal and 6 units with second meal to 10 units with first meal and 5 units with second meal.  -Continued metformin  1000 mg BID.  -Placed Libre 3+ sensor and connected to reader. -[facility] missing [facility] for [facility] to obtain [facility] 3+ sensor supply. Plan to fax proper documentation later today.  Asked patient to follow-up with pharmacy next week, on 5/1, for supply. -Patient educated on purpose, proper use, and potential adverse effects.  -Extensively discussed pathophysiology of diabetes, recommended lifestyle interventions, dietary effects on blood sugar control.  Patient verbalized current strategies with both diet and exercise plans to maintain, improved glucose control.  -Counseled on s/sx of and management of hypoglycemia.

## 2023-07-12 NOTE — Progress Notes (Signed)
 Reviewed and agree with Dr Macky Lower plan.

## 2023-07-12 NOTE — Assessment & Plan Note (Signed)
 Hypertension longstanding currently controlled. Blood pressure goal of <130/80 mmHg. Medication adherence is good.  -Continued lisinopril 2.5 mg.

## 2023-07-24 ENCOUNTER — Telehealth: Payer: Self-pay

## 2023-07-24 NOTE — Telephone Encounter (Signed)
 [facility] and Libre 3 sensor was successfully refilled. Patient will be contacted when ready for pick up.Patient informed to let me know when medication is ready for transportation assistance.  Perla Bradford Demika Langenderfer RN BSN PCCN  [facility] [phone]-cell [phone]-office

## 2023-07-25 NOTE — Congregational Nurse Program (Signed)
  Dept: [phone]   Congregational Nurse Program Note  Date of Encounter: 07/25/2023  Past Medical History: Past Medical History:  Diagnosis Date   Alcohol abuse    Diabetes mellitus without complication (HCC)    Hypertension     Encounter Details:  Community Questionnaire - 07/25/23 1416       Questionnaire   Ask client: Do you give verbal consent for me to treat you today? Yes    Student Assistance N/A    Location Patient Served  [facility]    Encounter Setting Phone/Text/Email    Population Status Migrant/Refugee    Insurance Medicare    Insurance/Financial Assistance Referral N/A    Medication Have Medication Insecurities;Patient Medications Reviewed    Medical Provider Yes    Screening Referrals Made N/A    Medical Referrals Made N/A    Medical Appointment Completed [facility] PCP/Clinic    CNP Interventions Advocate/Support;Educate;Counsel;Navigate Healthcare System;Case Management    Screenings CN Performed N/A    ED Visit Averted Yes    Life-Saving Intervention Made N/A            Transportation Assistance provided for  patient to pick up [employer]  from pharmacy.Language interpretation provided.  Perla Bradford Storm Sovine RN BSN PCCN  [facility] [phone]-cell [phone]-office

## 2023-08-04 NOTE — Progress Notes (Deleted)
  SUBJECTIVE:   CHIEF COMPLAINT / HPI:   Diabetes Meds: Lantus  14 units daily in the evening  Novolog  10 units with first meal and 5 units with second meal.  Metformin  1000 mg BID.   PERTINENT  PMH / PSH: ***  OBJECTIVE:  There were no vitals taken for this visit. ***  ASSESSMENT/PLAN:   Assessment & Plan  No follow-ups on file. Wilhemena Harbour, MD 08/04/2023, 10:18 PM PGY-***, [facility] {    This will disappear when note is signed, click to select method of visit    :1}

## 2023-08-04 NOTE — Patient Instructions (Addendum)
 It was great to see you! Thank you for allowing me to participate in your care!  I recommend that you always bring your medications to each appointment as this makes it easy to ensure we are on the correct medications and helps us  not miss when refills are needed.  Eat frequent snack to prevent lows  Our plans for today:  - Diabetes: Continue Lantus  14 units daily in the evening  Continue Novolog  10 units with first meal and 5 units with second meal.  Continue Metformin  1000 mg BID.   We are checking some labs today, I will call you if they are abnormal will send you a MyChart message or a letter if they are normal.  If you do not hear about your labs in the next 2 weeks please let us  know.  Take care and seek immediate care sooner if you develop any concerns.   Genora Kidd, MD [facility]   Johnn Najjar nzuri [place]! Asante kwa kuniruhusu kushiriki Meridee Standing wako!  Ninapendekeza ulete dawa zako kila mara kwa kila miadi kwani [place] Gabriel John Lacy Pick kuwa tunatumia dawa sahihi na hutusaidia tusikose wakati kujaza kunapohitajika.  Kula vitafunio mara kwa mara ili Lala Pili kupungua  Mipango yetu ya leo:  - Connor Deiters wa kisukari: Endelea Lantus  vitengo 14 kila siku jioni  Endelea Novolog  vitengo 10 na mlo wa kwanza na vitengo 5 na chakula cha pili.  Endelea Metformin  1000 mg BID.   Tunaangalia baadhi ya maabara leo, nitakupigia simu ikiwa sio [place] nitakutumia ujumbe wa MyChart au barua ikiwa ni za [place].  Dallis Dues hutasikia kuhusu maabara zako [place] wiki 2 zijazo tafadhali tujulishe.  Jihadharini na utafute huduma ya haraka mapema ikiwa unapata wasiwasi wowote.   Dk. Wilhemena Harbour, MD [facility]

## 2023-08-05 ENCOUNTER — Ambulatory Visit: Payer: Self-pay | Admitting: Student

## 2023-08-05 ENCOUNTER — Ambulatory Visit: Admitting: Student

## 2023-08-05 ENCOUNTER — Encounter: Payer: Self-pay | Admitting: Student

## 2023-08-05 DIAGNOSIS — E1169 Type 2 diabetes mellitus with other specified complication: Secondary | ICD-10-CM | POA: Diagnosis present

## 2023-08-05 DIAGNOSIS — Z794 Long term (current) use of insulin: Secondary | ICD-10-CM | POA: Diagnosis not present

## 2023-08-05 LAB — POCT GLYCOSYLATED HEMOGLOBIN (HGB A1C): HbA1c, POC (controlled diabetic range): 12.3 % — AB (ref 0.0–7.0)

## 2023-08-05 NOTE — Assessment & Plan Note (Signed)
 Glucose levels average 243 mg/dL, indicating hyperglycemia. Levels mostly above target. A1c not significantly improved-at 12.3 - Encourage snacks to prevent hypoglycemia - Maintain current regimen given lows - Consider initiating GLP-1 (previously was on ozempic  but no longer on this per documentation was having hypoglycemic events with Ozempic  and was discontinued due to confusion with insulin  pens given similar appearance) - Pharmacy f/u 6/16

## 2023-08-05 NOTE — Progress Notes (Signed)
  SUBJECTIVE:   CHIEF COMPLAINT / HPI: Diabetes fu  Diabetes Meds: Lantus  14 units daily in the evening  Novolog  10 units with first meal and 5 units with second meal.  Metformin  1000 mg BID Freestyle libre continuous glucose monitor Discussed the use of AI scribe software for clinical note transcription with the patient, who gave verbal consent to proceed.  History of Present Illness Randy Fisher is a 68 year old male with diabetes who presents for a follow-up visit.  Glucose levels are consistently around 250 mg/dL, with no hypoglycemic episodes or symptoms of feeling 'really low'. He uses a continuous glucose monitor, though the sensor fell off yesterday. Insulin  Lantus  is taken at 14 units nightly, with additional insulin  administered before meals. Glucose readings show one low reading in the past seven days and four low readings over the past fourteen days, without symptomatic episodes. Average glucose level is approximately 243 mg/dL and mostly on the higher side.  Patient brought all of his medicines today for confirmation  PERTINENT  PMH / PSH: HTN, T2DM, HLD  OBJECTIVE:  BP 114/75   Pulse 92   Ht 5\' 5"  (1.651 m)   Wt 163 lb 9.6 oz (74.2 kg)   SpO2 98%   BMI 27.22 kg/m  General: Well appearing, NAD, awake, alert, responsive to questions Head: Normocephalic atraumatic Respiratory: chest rises symmetrically,  no increased work of breathing Extremities: Moves upper and lower extremities freely  ASSESSMENT/PLAN:   Assessment & Plan Type 2 diabetes mellitus with other specified complication, with long-term current use of insulin  (HCC) Glucose levels average 243 mg/dL, indicating hyperglycemia. Levels mostly above target. A1c not significantly improved-at 12.3 - Encourage snacks to prevent hypoglycemia - Maintain current regimen given lows - Consider initiating GLP-1 (previously was on ozempic  but no longer on this per documentation was having hypoglycemic events with  Ozempic  and was discontinued due to confusion with insulin  pens given similar appearance) - Pharmacy f/u 6/16  No follow-ups on file. Genora Kidd, MD 08/05/2023, 4:23 PM PGY-3, [facility]

## 2023-08-06 LAB — BASIC METABOLIC PANEL WITH GFR
BUN/Creatinine Ratio: 13 (ref 10–24)
BUN: 15 mg/dL (ref 8–27)
CO2: 23 mmol/L (ref 20–29)
Calcium: 9.1 mg/dL (ref 8.6–10.2)
Chloride: 98 mmol/L (ref 96–106)
Creatinine, Ser: 1.12 mg/dL (ref 0.76–1.27)
Glucose: 270 mg/dL — ABNORMAL HIGH (ref 70–99)
Potassium: 4.7 mmol/L (ref 3.5–5.2)
Sodium: 138 mmol/L (ref 134–144)
eGFR: 71 mL/min/{1.73_m2} (ref >59)

## 2023-08-23 ENCOUNTER — Encounter: Payer: Self-pay | Admitting: *Deleted

## 2023-08-23 NOTE — Congregational Nurse Program (Signed)
  Dept: [phone]   Congregational Nurse Program Note  Date of Encounter: 08/23/2023  Past Medical History: Past Medical History:  Diagnosis Date   Alcohol abuse    Diabetes mellitus without complication (HCC)    Hypertension     Encounter Details:  Community Questionnaire - 08/23/23 1340       Questionnaire   Ask client: Do you give verbal consent for me to treat you today? Yes    Student Assistance N/A    Location Patient Served  [facility]    Encounter Setting CN site    Population Status Migrant/Refugee    Insurance Medicare    Insurance/Financial Assistance Referral N/A    Medication Have Medication Insecurities    Medical Provider Yes    Screening Referrals Made N/A    Medical Referrals Made N/A    Medical Appointment Completed N/A    CNP Interventions Advocate/Support;Educate;Counsel    Screenings CN Performed N/A    ED Visit Averted Yes    Life-Saving Intervention Made N/A            Client came into [place] today for BP check and for food pantry.  Offers no complaints.  Derk Fleming, RN, MSN, CNP [phone] Office [phone] Cell

## 2023-08-30 ENCOUNTER — Other Ambulatory Visit (HOSPITAL_COMMUNITY): Payer: Self-pay

## 2023-09-02 ENCOUNTER — Ambulatory Visit: Admitting: Pharmacist

## 2023-10-08 ENCOUNTER — Other Ambulatory Visit: Payer: Self-pay | Admitting: Family Medicine

## 2023-10-08 DIAGNOSIS — E118 Type 2 diabetes mellitus with unspecified complications: Secondary | ICD-10-CM

## 2023-10-23 ENCOUNTER — Ambulatory Visit

## 2023-10-23 VITALS — BP 107/69 | HR 90 | Ht 65.0 in | Wt 167.4 lb

## 2023-10-23 DIAGNOSIS — E1169 Type 2 diabetes mellitus with other specified complication: Secondary | ICD-10-CM

## 2023-10-23 DIAGNOSIS — Z794 Long term (current) use of insulin: Secondary | ICD-10-CM | POA: Diagnosis not present

## 2023-10-23 DIAGNOSIS — K0889 Other specified disorders of teeth and supporting structures: Secondary | ICD-10-CM | POA: Diagnosis present

## 2023-10-23 LAB — POCT GLYCOSYLATED HEMOGLOBIN (HGB A1C): HbA1c POC (<> result, manual entry): >15 % (ref 4.0–5.6)

## 2023-10-23 NOTE — Assessment & Plan Note (Signed)
 A1c collected today, follow-up in refugee clinic on 08/26.

## 2023-10-23 NOTE — Patient Instructions (Addendum)
 Ilikuwa nzuri kukuona! Asante kwa kuniruhusu kushiriki kelle query wako!  Mipango yetu ya leo:  - Unahitaji china daktari mwingine wa meno kuhusu kubadilisha meno yako. [place] Query miadi katika kliniki yetu mnamo 08/26. - Nimemtumia carnella Genie Logue ili [place] njia ya medicaid kusaidia [name] uingizwaji wa [name].   [place] canny ijxpxj 84 KABLA ya wakati ulioratibiwa [place] wa miadi! Usipofanya hivyo, hii itaathiri utunzaji wa wagonjwa WENGINE.  Jihadharini na utafute huduma ya haraka mapema ikiwa unapata wasiwasi wowote.   Ozell Provencal, MD, PGY-3 [facility] Randy Fisher ya [place] 9:08 AM 10/23/2023  [name]  It was great to see you! Thank you for allowing me to participate in your care!  Our plans for today:  - You need to see another dentist about replacing your teeth. - You have an appointment in our clinic on 08/26. - I have messaged Genie Logue to see if there is a way for medicaid to help cover your teeth replacement.   Please arrive 15 minutes PRIOR to your next scheduled appointment time! If you do not, this affects OTHER patients' care.  Take care and seek immediate care sooner if you develop any concerns.   Ozell Provencal, MD, PGY-3 [facility] 9:08 AM 10/23/2023  [facility]

## 2023-10-23 NOTE — Progress Notes (Signed)
    SUBJECTIVE:   CHIEF COMPLAINT / HPI: unable to eat  Unable to eat Got teeth removed 2-3 months ago Was supposed to get new teeth, but was told medicaid would not cover it Dentist did all the measurements Has been unable to eat normally since then Removed all teeth except 6 of them Unable to eat solid food because he can't chew No pain in mouth Weight has not changed Has been eating soft foods like porridge Dentist is [place], does not remember name  PERTINENT  PMH / PSH: HTN, T2DM  OBJECTIVE:   BP 107/69   Pulse 90   Ht 5' 5 (1.651 m)   Wt 167 lb 6.4 oz (75.9 kg)   SpO2 99%   BMI 27.86 kg/m   General: NAD, well appearing Oral: Some incisor presents, majority of teeth removed, no molars, healthy appearance of gums Neuro: A&O Respiratory: normal WOB on RA Extremities: Moving all 4 extremities equally   ASSESSMENT/PLAN:   Assessment & Plan Difficulty chewing Weight stable, no signs of malnutrition.  Unfortunately patient has few options for tooth replacement if Medicaid will not cover.  I have reached out to congregation will nurse Lugene Ropes for potential further resources.  Counseled regarding continued soft diet. Type 2 diabetes mellitus with other specified complication, with long-term current use of insulin  (HCC) A1c collected today, follow-up in refugee clinic on 08/26.  Future Appointments  Date Time Provider Department Center  11/12/2023  3:00 PM [facility]     Ozell Provencal, MD [facility]

## 2023-11-07 ENCOUNTER — Other Ambulatory Visit: Payer: Self-pay

## 2023-11-07 DIAGNOSIS — E118 Type 2 diabetes mellitus with unspecified complications: Secondary | ICD-10-CM

## 2023-11-08 MED ORDER — GABAPENTIN 300 MG PO CAPS: 300.0000 mg | ORAL_CAPSULE | Freq: Three times a day (TID) | ORAL | 1 refills | Status: DC

## 2023-11-12 ENCOUNTER — Ambulatory Visit

## 2023-11-12 DIAGNOSIS — Z794 Long term (current) use of insulin: Secondary | ICD-10-CM

## 2023-11-12 DIAGNOSIS — I10 Essential (primary) hypertension: Secondary | ICD-10-CM

## 2023-12-30 ENCOUNTER — Ambulatory Visit (INDEPENDENT_AMBULATORY_CARE_PROVIDER_SITE_OTHER)

## 2023-12-30 VITALS — BP 110/70 | HR 88 | Ht 65.0 in | Wt 156.0 lb

## 2023-12-30 DIAGNOSIS — Z794 Long term (current) use of insulin: Secondary | ICD-10-CM | POA: Diagnosis not present

## 2023-12-30 DIAGNOSIS — E1169 Type 2 diabetes mellitus with other specified complication: Secondary | ICD-10-CM

## 2023-12-30 MED ORDER — BASAGLAR KWIKPEN 100 UNIT/ML ~~LOC~~ SOPN: 14.0000 [IU] | PEN_INJECTOR | SUBCUTANEOUS | 11 refills | Status: DC

## 2023-12-30 MED ORDER — NOVOLOG FLEXPEN 100 UNIT/ML ~~LOC~~ SOPN: 5.0000 [IU] | PEN_INJECTOR | Freq: Two times a day (BID) | SUBCUTANEOUS | 11 refills | Status: DC

## 2023-12-30 MED ORDER — INSULIN PEN NEEDLE 31G X 4 MM MISC: 1.0000 | 99 refills | Status: DC | PRN

## 2023-12-30 NOTE — Patient Instructions (Addendum)
 It was wonderful to see you today.  Please bring ALL of your medications with you to every visit.   Today we talked about:  Your diabetes.  I have sent in the 4 mm smaller needles to your pharmacy for your insulin .  I have sent in refills of your insulin  as well.  I have given you 2 boxes of sensors that last 15 days each.  Please change them every 15 days.  Dr. Koval, our clinical pharmacist would like to see you within the next 2 weeks to also check on your diabetes and your sensor.  Please bring your diabetic reader to your visit with him.  Follow-up with me in 1 month for your next hemoglobin A1c and microalbumin check.  Kisukari chako. Nimetuma sindano ndogo za mm 4 kwenye duka lako la dawa kwa insulini yako. Nimekutumia kujaza tena insulini yako. Nimekupa visanduku 2 vya vitambuzi vinavyodumu kwa siku 15 kila kimoja. Tafadhali zibadilishe kila baada ya siku 15. Dk. Amalia, mfamasia wetu wa kimatibabu angependa marita ndani ya wiki 2 zijazo ili kuangalia pia ugonjwa wako wa kisukari na [place]. Tafadhali mlete msomaji wako wa kisukari kwenye ziara yako pamoja naye. Fuatana nami baada ya mwezi 1 kwa ukaguzi wako ujao wa hemoglobin A1c na microalbumin.  Thank you for choosing [facility].   Please call [phone] with any questions about today's appointment.  Please arrive at least 15 minutes prior to your scheduled appointments.   If you had blood work today, I will send you a MyChart message or a letter if results are normal. Otherwise, I will give you a call.   If you had a referral placed, they will call you to set up an appointment. Please give us  a call if you don't hear back in the next 2 weeks.   If you need additional refills before your next appointment, please call your pharmacy first.   You should follow up in our clinic in 1 month with me.  Camie Dixons, DO Family Medicine

## 2023-12-30 NOTE — Progress Notes (Unsigned)
    SUBJECTIVE:   CHIEF COMPLAINT / HPI:   This entire encounter was conducted with Language [employer] [phone].  Ekin is a 68yo M who presents to the clinic for follow up of DM management. Patient states he has been doing much better overall, his only complaint today is that the 8mm insulin  needles that were sent into the pharmacy at the last visit and would like me to send in the 4mm he previously used.   He is taking 9u Novolog  in the AM and 12u Lantus  in the PM. He denies any hypoglycemic episodes. Fingerstick glucose this AM 123. He has been periodically checking his blood sugars at home with fingerstick since his CGM sensor fell off back in May and gets readings between 120 - 140.  PERTINENT  PMH / PSH: diabetes  OBJECTIVE:   BP 110/70   Pulse 88   Ht 5' 5 (1.651 m)   Wt 156 lb (70.8 kg)   SpO2 97%   BMI 25.96 kg/m    General: A&O, NAD HEENT: No sign of trauma, EOM grossly intact Cardiac: RRR, no m/r/g Respiratory: CTAB, normal WOB, no w/c/r GI: Soft, NTTP, non-distended  Extremities: NTTP, no peripheral edema. Neuro: Normal gait, moves all four extremities appropriately. Psych: Appropriate mood and affect   ASSESSMENT/PLAN:   Assessment & Plan Type 2 diabetes mellitus with other specified complication, with long-term current use of insulin  (HCC) Last A1c >15 08/06. With home bgs of 120-140, adequately controlled at this time and hypoglycemic episodes resolved with discontinuation of Ozempic  and decrease in insulin  regimen.  - Continue 9u Novolog  in the AM and 12u Lantus  in the PM - Discussed carb controlled diet  - 4mm insulin  needles sent to pharmacy  - Insulin  refills sent to pharmacy  - Reestablish CGM management with Dr. Koval - appt made for 10/21 0930 (patient was given 1 month supply of CGM sensors today, aware to bring sensor reader for all visits) - See me back in 1 month on 11/14 1145 for A1C and microalbumin  Camie Dixons, DO [facility]  Health [facility] Medicine Center

## 2023-12-31 NOTE — Assessment & Plan Note (Signed)
 Last A1c >15 08/06. With home bgs of 120-140, adequately controlled at this time and hypoglycemic episodes resolved with discontinuation of Ozempic  and decrease in insulin  regimen.  - Continue 9u Novolog  in the AM and 12u Lantus  in the PM - Discussed carb controlled diet  - 4mm insulin  needles sent to pharmacy  - Insulin  refills sent to pharmacy  - Reestablish CGM management with Dr. Koval - appt made for 10/21 0930 (patient was given 1 month supply of CGM sensors today, aware to bring sensor reader for all visits) - See me back in 1 month on 11/14 1145 for A1C and microalbumin

## 2024-01-01 NOTE — Congregational Nurse Program (Addendum)
  Dept: [phone]   Congregational Nurse Program Note  Date of Encounter: 01/01/2024  Past Medical History: Past Medical History:  Diagnosis Date   Alcohol abuse    Diabetes mellitus without complication (HCC)    Hypertension     Encounter Details:  Community Questionnaire - 01/01/24 1019       Questionnaire   Ask client: Do you give verbal consent for me to treat you today? Yes    Student Assistance [employer]    [place] Served  [place]    Encounter Setting CN site    Population Status Migrant/Refugee    Insurance Medicare    Insurance/Financial Assistance Referral N/A    Medication Have Medication Insecurities    Medical Provider Yes    Screening Referrals Made N/A    Medical Referrals Made N/A    Medical Appointment Completed N/A    CNP Interventions Advocate/Support;Educate;Counsel;Case Management    Screenings CN Performed N/A    ED Visit Averted N/A    Life-Saving Intervention Made N/A        Patient brought in his Libre 3 Plus sensor requesting education on how to insert. Patient came in with his 22 year old son, education provided son felt comfortable with assisting his dad to use Libre 3 sensor  at home.   Naomie Khayden Herzberg RN BSN PCCN  [facility] [phone]

## 2024-01-02 ENCOUNTER — Other Ambulatory Visit: Payer: Self-pay

## 2024-01-02 DIAGNOSIS — I10 Essential (primary) hypertension: Secondary | ICD-10-CM

## 2024-01-03 MED ORDER — LISINOPRIL 2.5 MG PO TABS: 2.5000 mg | ORAL_TABLET | Freq: Every day | ORAL | 3 refills | Status: AC

## 2024-01-07 ENCOUNTER — Ambulatory Visit: Admitting: Pharmacist

## 2024-01-21 ENCOUNTER — Ambulatory Visit (INDEPENDENT_AMBULATORY_CARE_PROVIDER_SITE_OTHER): Admitting: Pharmacist

## 2024-01-21 ENCOUNTER — Encounter: Payer: Self-pay | Admitting: Pharmacist

## 2024-01-21 VITALS — BP 112/65 | HR 81 | Wt 162.0 lb

## 2024-01-21 DIAGNOSIS — I10 Essential (primary) hypertension: Secondary | ICD-10-CM

## 2024-01-21 DIAGNOSIS — Z794 Long term (current) use of insulin: Secondary | ICD-10-CM | POA: Diagnosis not present

## 2024-01-21 DIAGNOSIS — E782 Mixed hyperlipidemia: Secondary | ICD-10-CM | POA: Diagnosis not present

## 2024-01-21 DIAGNOSIS — E1169 Type 2 diabetes mellitus with other specified complication: Secondary | ICD-10-CM

## 2024-01-21 MED ORDER — INSULIN PEN NEEDLE 29G X 4MM MISC: 1.0000 | 11 refills | Status: AC | PRN

## 2024-01-21 NOTE — Progress Notes (Signed)
 S:    Chief Complaint  Patient presents with   Medication Management    Diabetes - CGM review   68 y.o. male who presents for diabetes evaluation, education, and management. Patient arrives in good spirits and presents without any assistance. Patient visit conducted with assistance of video interpreter GLENWOOD Chroman [phone]  Patient was referred and last seen by Primary Care Provider, Dr. Lupie, on 12/30/2023.  Upon arrival, patient reports he is doing well overall, however, he requests shorter needle tips due to pain with injection using 8 mm needle tips.   Current diabetes medications include: Lantus  10 units daily in the evening, Novolog  6 units with second meal, metformin  1000 mg BID Current hypertension medications include: lisinopril  2.5 mg Current hyperlipidemia medications include: atorvastatin  40 mg  Patient reports adherence to taking all oral medications as prescribed.  However, due to pain with 8mm insulin  pen needles, patient has reduced mealtime injection from twice daily to once daily.  He continues to take Lantus  (insulin  glargine) daily as well.   Do you feel that your medications are working for you? Yes, but desires more frequent visits to gain improved glucose control.  Have you been experiencing any side effects to the medications prescribed? no Do you have any problems obtaining medications due to transportation or finances? no  Patient denies hypoglycemic events.  Patient reported dietary habits: Eats 2 larger meals/day  O:   Review of Systems  All other systems reviewed and are negative.   Physical Exam Vitals reviewed.  Constitutional:      Appearance: Normal appearance.  Pulmonary:     Effort: Pulmonary effort is normal.  Neurological:     Mental Status: He is alert.  Psychiatric:        Mood and Affect: Mood normal.        Behavior: Behavior normal.        Thought Content: Thought content normal.        Judgment: Judgment normal.    Libre3  CGM Download today 01/21/24 % Time CGM is active: 96% Average Glucose: 320 mg/dL Glucose Management Indicator: 11.0  Glucose Variability: 32% (goal <36%) Time in Goal:  - Time in range 70-180: 12% - Time above range: 86% - Time below range: 2% Observed patterns:  Lab Results  Component Value Date   HGBA1C >15.0 10/23/2023   Vitals:   01/21/24 1414  BP: 112/65  Pulse: 81  SpO2: 99%    Lipid Panel     Component Value Date/Time   CHOL 162 10/01/2022 1432   TRIG 134 10/01/2022 1432   HDL 51 10/01/2022 1432   CHOLHDL 3.2 10/01/2022 1432   CHOLHDL 5.8 (H) 06/03/2015 1640   VLDL 34 (H) 06/03/2015 1640   LDLCALC 87 10/01/2022 1432    Clinical Atherosclerotic Cardiovascular Disease (ASCVD): No  The 10-year ASCVD risk score (Arnett DK, et al., 2019) is: 25.2%   Values used to calculate the score:     Age: 33 years     Clincally relevant sex: Male     Is Non-Hispanic African American: Yes     Diabetic: Yes     Tobacco smoker: No     Systolic Blood Pressure: 112 mmHg     Is BP treated: Yes     HDL Cholesterol: 51 mg/dL     Total Cholesterol: 162 mg/dL   Patient is participating in a Managed Medicaid Plan:  Yes   A/P: Diabetes longstanding currently worsened control (GMI=11). Patient is able to  verbalize appropriate hypoglycemia management plan. Medication adherence appears to be less due to pain with length of insulin  pen needles (8mm). Control is suboptimal due to lack of early meal prandial insulin  coverage as seen on Libre 3 report.  -Continue Lantus  (insulin  glargine) at 10 units daily in the evening.  -Changed rapid insulin  Novolog  (insulin  aspart) from 6 units only later meal to 6units with first meal and 6 units with second meal.  -Continued metformin  1000 mg BID.  -Placed Libre 3+ sensor and connected to reader. -Patient educated on purpose, proper use, and potential adverse effects.  -Extensively discussed pathophysiology of diabetes, recommended lifestyle  interventions, dietary effects on blood sugar control.  Patient verbalized current strategies with both diet and exercise plans to maintain, improved glucose control.    ASCVD risk - primary prevention in patient with diabetes. Last LDL is 87 not at goal of <70 mg/dL. ASCVD risk factors include HTN, HLD and T2DM and 10-year ASCVD risk score of 27.4%. High intensity statin indicated.  -Continued atorvastatin  40 mg.  -Lipid Panel at next visit with PCP 11/14 discussed with patient.  -Consider dose increase to 80mg  with next visit/refill.    Hypertension longstanding currently controlled. Blood pressure goal of <130/80 mmHg. Medication adherence is good.  -Continued lisinopril  2.5 mg - UACR at next visit with PCP 11/14 discussed with patient.   Written patient instructions provided. Patient verbalized understanding of treatment plan.  Total time in face to face counseling 38 minutes.    Follow-up:  Pharmacist 11/20 PCP clinic visit in 11/14 Patient seen with Lawson Mao, PharmD Candidate - PY3 student and Rec[employer] PharmD - PY4 Candidate.

## 2024-01-21 NOTE — Assessment & Plan Note (Signed)
 Diabetes longstanding currently worsened control (GMI=11). Patient is able to verbalize appropriate hypoglycemia management plan. Medication adherence appears to be less due to pain with length of insulin  pen needles (8mm). Control is suboptimal due to lack of early meal prandial insulin  coverage as seen on Libre 3 report.  -Continue Lantus  (insulin  glargine) at 10 units daily in the evening.  -Changed rapid insulin  Novolog  (insulin  aspart) from 6 units only later meal to 6units with first meal and 6 units with second meal.  -Continued metformin  1000 mg BID.  -Placed Libre 3+ sensor and connected to reader. -Patient educated on purpose, proper use, and potential adverse effects.  -Extensively discussed pathophysiology of diabetes, recommended lifestyle interventions, dietary effects on blood sugar control.  Patient verbalized current strategies with both diet and exercise plans to maintain, improved glucose control.

## 2024-01-21 NOTE — Assessment & Plan Note (Signed)
 Diabetes longstanding currently worsened control (GMI=11). Patient is able to verbalize appropriate hypoglycemia management plan. Medication adherence appears to be less due to pain with length of insulin  pen needles (8mm). Control is suboptimal due to lack of early meal prandial insulin  coverage as seen on Libre 3 report.  -Continue Lantus  (insulin  glargine) at 10 units daily in the evening.  -Changed rapid insulin  Novolog  (insulin  aspart) from 6 units only later meal to 6units with first meal and 6 units with second meal.  -Continued metformin  1000 mg BID.  -Placed Libre 3+ sensor and connected to reader. -Patient educated on purpose, proper use, and potential adverse effects.  -Extensively discussed pathophysiology of diabetes, recommended lifestyle interventions, dietary effects on blood sugar control.  Patient verbalized current strategies with both diet and exercise plans to maintain, improved glucose control.    ASCVD risk - primary prevention in patient with diabetes. Last LDL is 87 not at goal of <70 mg/dL. ASCVD risk factors include HTN, HLD and T2DM and 10-year ASCVD risk score of 27.4%. High intensity statin indicated.  -Continued atorvastatin  40 mg.  -Lipid Panel at next visit with PCP 11/14 discussed with patient.  -Consider dose increase to 80mg  with next visit/refill.

## 2024-01-21 NOTE — Patient Instructions (Addendum)
 Ilikuwa nzuri kukuona leo!  Vipimo vyako vya glukosi ni vya juu zaidi, kwa sababu ya kutokuchukua dozi mbili za insulini wakati wa mlo (kutokana na maumivu ya sindano).  Natumaini sindano fupi itakusaidia kuepuka maumivu.   Mabadiliko ya Dawa: Kukuongezea Novolog  (insulin  aspart) - kipimo cha wakati wa chakula hadi vitengo 6 mara mbili kwa siku  Endelea kuchukua Lantus  (insulin  glargine) kwa kipimo sawa vitengo 10 mara moja kwa siku.   Endelea dawa zingine zote sawa.   Endelea na kazi nzuri na lishe na mazoezi. Lengo la chakula kilichojaa mboga [place], matunda na nyama konda ([place] Jaribu kupunguza ulaji wa chumvi kwa kula mboga safi au zilizogandishwa (badala ya [place]), suuza mboga za makopo kabla ya kupika na usiongeze chumvi yoyote kwenye milo.    It was nice to see you today!  Your glucose readings are higher, due to not taking two doses of meal time insulin  (due to pain with injection).  I hope shorter needle will help you avoid the pain.   Medication Changes: Increase you Novolog  (insulin  aspart) - mealtime  dosing to 6 units TWICE daily  Continue taking Lantus  (insulin  glargine) at the same dose 10 units once daily.   Continue all other medication the same.   Keep up the good work with diet and exercise. Aim for a diet full of vegetables, fruit and lean meats (chicken, turkey, fish). Try to limit salt intake by eating fresh or frozen vegetables (instead of canned), rinse canned vegetables prior to cooking and do not add any additional salt to meals.

## 2024-01-21 NOTE — Assessment & Plan Note (Signed)
 Hypertension longstanding currently controlled. Blood pressure goal of <130/80 mmHg. Medication adherence is good.  -Continued lisinopril  2.5 mg - UACR at next visit with PCP 11/14 discussed with patient.

## 2024-01-22 NOTE — Progress Notes (Signed)
 Reviewed and agree with Dr Rennis plan.

## 2024-01-31 ENCOUNTER — Ambulatory Visit: Payer: Self-pay

## 2024-01-31 VITALS — BP 130/85 | HR 82 | Wt 161.2 lb

## 2024-01-31 DIAGNOSIS — E1169 Type 2 diabetes mellitus with other specified complication: Secondary | ICD-10-CM

## 2024-01-31 DIAGNOSIS — Z23 Encounter for immunization: Secondary | ICD-10-CM | POA: Diagnosis present

## 2024-01-31 DIAGNOSIS — Z794 Long term (current) use of insulin: Secondary | ICD-10-CM

## 2024-01-31 LAB — POCT GLYCOSYLATED HEMOGLOBIN (HGB A1C): HbA1c POC (<> result, manual entry): >15 % (ref 4.0–5.6)

## 2024-01-31 NOTE — Patient Instructions (Addendum)
 It was wonderful to see you today.  Please bring ALL of your medications with you to every visit.   Please take your insulin  as prescribed:  Rapid insulin  NovoLog  6 unit with first meal Rapid insulin  NovoLog  6 units with second meal Lantus  long-acting insulin  10 units in the evening 1 metformin  tablet 1000 mg in the morning 1 metformin  tablet 1000 mg in the evening  You have a follow-up with our medication expert Dr. Koval on 02/06/2024 at 9:30 AM.  He will change your CGM monitor on your arm at that time.  I would like to see you back in 1 month.  This appointment has been scheduled for December 11 at 1:45 PM.   Liddie ni fahari [place] leo.  Tafadhali leta Dawa ZAKO ZOTE kila unapokuja kwenye uchunguzi.  Tafadhali chukua insulin  yako kama ilivyoagizwa:  Insulin  ya haraka NovoLog  6 units na mlo wa kwanza Insulin  ya haraka NovoLog  6 units na mlo wa pili Insulin  ya muda mrefu Lantus  units 10 jioni 1 kidonge cha metformin  1000 mg asubuhi 1 kidonge cha metformin  1000 mg jioni  Una uchunguzi wa kufuatilia na mtaalamu wetu wa dawa [name] tarehe 20/01/2024 saa 9:30 asubuhi. Atabadilisha kipimaji chako cha CGM kwenye mkono wako wakati huo.  Ningependa kukuona tena baada ya mwezi 1. Miadi hii imesanidiwa kwa tarehe 11 Desemba saa 1:45 mchana.    Camie Dixons, DO Family Medicine

## 2024-01-31 NOTE — Assessment & Plan Note (Addendum)
 A1C 15 today. Prot/cr ratio and lipid panel obtained.  Recent visit with Dr. Koval for medication management, appreciate recs. - Continue rapid insulin  NovoLog  insulin  volog (insulin  aspart) 6u with first meal and 6u with second meal.  - Continue Lantus  (insulin  glargine) at 10u daily in the evening.  - Continue metformin  1000mg  BID - Continue CGM with Libre3+ - Continue lifestyle interventions, exercise plans to maintain glucose control.  - Reviewed DM regimen in depth and wrote up regimen on AVS as well as which insulin  pens to use when, since patient was getting long and short acting mixed up. He verbalizes understanding and is able to teach back.  - Follow up with Dr. Koval 02/06/2024

## 2024-01-31 NOTE — Progress Notes (Signed)
    SUBJECTIVE:   CHIEF COMPLAINT / HPI:   This entire encounter was conducted with virtual Financial Controller.   Randy Fisher is a 68yo M who presents to the clinic for follow up of DM management. Patient states he hasn't been feeling well the last few days. He reports he has not taken his insulin  since last visit because he did not receive the correct mm needles. The pharmacy gave him 5mm instead of 4mm as prescribed. Libre3+ monitor reports Bgs in the 250s-380s in the last 14 days.  He was recently seen by Dr. Koval for DM medication management on 01/21/2024.   PERTINENT  PMH / PSH: uncontrolled insulin  dependent T2DM  OBJECTIVE:   BP 130/85   Pulse 82   Wt 161 lb 3.2 oz (73.1 kg)   SpO2 100%   BMI 26.83 kg/m   General: A&O, NAD HEENT: No sign of trauma, EOM grossly intact Cardiac: RRR, no m/r/g Respiratory: CTAB, normal WOB, no w/c/r GI: Soft, non-distended, mild superficial TTP periumbilical where patient injects himself with insulin  Extremities: NTTP, no peripheral edema. Neuro: Normal gait, moves all four extremities appropriately. Psych: Appropriate mood and affect   ASSESSMENT/PLAN:   Assessment & Plan Type 2 diabetes mellitus with other specified complication, with long-term current use of insulin  (HCC) A1C 15 today. Prot/cr ratio and lipid panel obtained.  Recent visit with Dr. Koval for medication management, appreciate recs. - Continue rapid insulin  NovoLog  insulin  volog (insulin  aspart) 6u with first meal and 6u with second meal.  - Continue Lantus  (insulin  glargine) at 10u daily in the evening.  - Continue metformin  1000mg  BID - Continue CGM with Libre3+ - Continue lifestyle interventions, exercise plans to maintain glucose control.  - Reviewed DM regimen in depth and wrote up regimen on AVS as well as which insulin  pens to use when, since patient was getting long and short acting mixed up. He verbalizes understanding and is able to teach back.  - Follow up  with Dr. Koval 02/06/2024 Encounter for immunization Flu vaccine administered with patient consent.    Camie Dixons, DO [facility] Medicine Center

## 2024-02-02 LAB — LIPID PANEL
Chol/HDL Ratio: 2.3 ratio (ref 0.0–5.0)
Cholesterol, Total: 103 mg/dL (ref 100–199)
HDL: 44 mg/dL (ref >39)
LDL Chol Calc (NIH): 48 mg/dL (ref 0–99)
Triglycerides: 45 mg/dL (ref 0–149)
VLDL Cholesterol Cal: 11 mg/dL (ref 5–40)

## 2024-02-02 LAB — PROTEIN / CREATININE RATIO, URINE
Creatinine, Urine: 25.6 mg/dL
Protein, Ur: <4 mg/dL

## 2024-02-06 ENCOUNTER — Encounter: Payer: Self-pay | Admitting: Pharmacist

## 2024-02-06 ENCOUNTER — Telehealth: Payer: Self-pay

## 2024-02-06 ENCOUNTER — Other Ambulatory Visit (HOSPITAL_COMMUNITY): Payer: Self-pay

## 2024-02-06 ENCOUNTER — Ambulatory Visit (INDEPENDENT_AMBULATORY_CARE_PROVIDER_SITE_OTHER): Admitting: Pharmacist

## 2024-02-06 VITALS — BP 125/71 | HR 83 | Wt 164.6 lb

## 2024-02-06 DIAGNOSIS — I10 Essential (primary) hypertension: Secondary | ICD-10-CM

## 2024-02-06 DIAGNOSIS — E1169 Type 2 diabetes mellitus with other specified complication: Secondary | ICD-10-CM

## 2024-02-06 DIAGNOSIS — Z794 Long term (current) use of insulin: Secondary | ICD-10-CM

## 2024-02-06 DIAGNOSIS — E782 Mixed hyperlipidemia: Secondary | ICD-10-CM | POA: Diagnosis not present

## 2024-02-06 DIAGNOSIS — E118 Type 2 diabetes mellitus with unspecified complications: Secondary | ICD-10-CM

## 2024-02-06 MED ORDER — NOVOLOG FLEXPEN 100 UNIT/ML ~~LOC~~ SOPN: 6.0000 [IU] | PEN_INJECTOR | Freq: Two times a day (BID) | SUBCUTANEOUS | 11 refills | Status: DC

## 2024-02-06 MED ORDER — INSULIN LISPRO 100 UNIT/ML IJ SOLN: 6.0000 [IU] | Freq: Two times a day (BID) | INTRAMUSCULAR | 3 refills | Status: DC

## 2024-02-06 MED ORDER — GABAPENTIN 300 MG PO CAPS: 300.0000 mg | ORAL_CAPSULE | Freq: Three times a day (TID) | ORAL | 1 refills | Status: AC

## 2024-02-06 MED ORDER — BASAGLAR KWIKPEN 100 UNIT/ML ~~LOC~~ SOPN: 10.0000 [IU] | PEN_INJECTOR | SUBCUTANEOUS | 11 refills | Status: DC

## 2024-02-06 NOTE — Assessment & Plan Note (Signed)
 Hypertension longstanding currently controlled. Blood pressure goal of <130/80  mmHg. Medication adherence okay.  -Continue lisinopril  2.5 mg daily.

## 2024-02-06 NOTE — Addendum Note (Signed)
 Addended by: Emalyn Schou G on: 02/06/2024 05:18 PM   Modules accepted: Orders

## 2024-02-06 NOTE — Telephone Encounter (Signed)
 Rec'd PA request for patients generic Humalog .   Per previous denial 05/23/23, insurance prefers Novolog .  Looks like patient has currently been on Novolog .

## 2024-02-06 NOTE — Assessment & Plan Note (Signed)
 ASCVD risk - primary prevention in patient with diabetes. Last LDL is 48 at goal of <70  mg/dL. ASCVD risk factors include HTN, HLD and T2DM and 10-year ASCVD risk score of 27.4%. High intensity statin indicated.  -Continued atorvastatin  40 mg.

## 2024-02-06 NOTE — Progress Notes (Addendum)
 Alm #[id number]     S:     Chief Complaint  Patient presents with   Medication Management    Diabetes follow-up   68 y.o. male who presents for diabetes evaluation, education, and management. Patient arrives in good spirits and presents without any assistance. Patient is accompanied by interpreter Alm [phone] and in person interpreter Lucy.   Patient was referred and last seen by Primary Care Provider, Dr. Lupie, on 01/31/2024  PMH is significant for T2DM, hyperlipidemia, hypertension, alcohol abuse.  Patient reports Diabetes was diagnosed in 2016.   Current diabetes medications include: Lantus  (insulin  glargine) 10 units daily, Novolog  (insulin  aspart) 6 units BID Current hypertension medications include: lisinopril  2.5 mg daily Current hyperlipidemia medications include: Atorvastatin  40 mg daily  Insurance coverage: Medicare  Patient reported confusion with his insulin  medications and that he is not sure on how to take them.   Patient reports hypoglycemic events.  Patient reports nocturia (nighttime urination). Not every nights but some times per week.  Patient reports neuropathy (nerve pain). Patient reports visual changes.  O:  Review of Systems  All other systems reviewed and are negative.  Physical Exam Vitals reviewed.  Pulmonary:     Effort: Pulmonary effort is normal.  Neurological:     Mental Status: He is alert.  Psychiatric:        Mood and Affect: Mood normal.        Behavior: Behavior normal.   Libre3 CGM Download today 01/23/2024-02/05/2024 % Time CGM is active: 96% Average Glucose: 310 mg/dL Glucose Management Indicator: 10.7  Glucose Variability: 33.3% (goal <36%) Time in Goal:  - Time in range 70-180: 11% - Time above range: 85% - Time below range: 4%  Lab Results  Component Value Date   HGBA1C >15 01/31/2024   Vitals:   02/06/24 0941  BP: 125/71  Pulse: 83  SpO2: 100%    Lipid Panel     Component Value Date/Time   CHOL 103  01/31/2024 1459   TRIG 45 01/31/2024 1459   HDL 44 01/31/2024 1459   CHOLHDL 2.3 01/31/2024 1459   CHOLHDL 5.8 (H) 06/03/2015 1640   VLDL 34 (H) 06/03/2015 1640   LDLCALC 48 01/31/2024 1459   A/P: Diabetes longstanding since 2016 currently uncontrolled. Patient is able to verbalize appropriate hypoglycemia management plan. Medication adherence appears sporadic. Control is suboptimal due to non adherence and suboptimal regimen. Patient reports taking Lantus  (insulin  glargine) in the afternoon, closer to dinner time and the Novolog  (insulin  aspart) in the morning. Has combination of insulins.  Removed the Lantus  (insulin  glargine) and Novolog  (insulin  aspart) from his possession to eliminate confusion/redundancy.  -Continued basal insulin  Basaglar  (insulin  glargine) at 10 units daily in the morning. Educated patient extensively on timing of dose.  -Continued 6 units of rapid insulin  Humalog  (insulin  lispro) at lunch and dinner -Continued metformin  1000 mg BID.  -Patient educated on purpose, proper use, and potential adverse effects.  -Extensively discussed pathophysiology of diabetes, recommended lifestyle interventions, dietary effects on blood sugar control.  -Counseled on s/sx of and management of hypoglycemia.   ASCVD risk - primary prevention in patient with diabetes. Last LDL is 48 at goal of <70  mg/dL. ASCVD risk factors include HTN, HLD and T2DM and 10-year ASCVD risk score of 27.4%. High intensity statin indicated.  -Continued atorvastatin  40 mg.   Hypertension longstanding currently controlled. Blood pressure goal of <130/80  mmHg. Medication adherence okay.  -Continue lisinopril  2.5 mg daily.  Written patient instructions provided. Patient  verbalized understanding of treatment plan.  Total time in face to face counseling 45 minutes.    Follow-up:  PCP: 02/27/24 Pharmacist: 03/05/2024 Patient seen with Lawson Mao, PharmD Candidate - PY3 student and Recardo [employer] PharmD - PY4  Candidate.     Determined that PA prefers Novolog  (insulin  aspart) as prandial insulin   - New prescription provided with request that pharmacy maintain consistent supply fill.

## 2024-02-06 NOTE — Patient Instructions (Addendum)
 It was nice to see you today!  Your goal blood sugar is 80-130 before eating and less than 180 after eating.  Medication Changes: [place] Basaglar  (insulin  glargine) vitengo 10 ASUBUHI [place] insulini fupi ya kaimu Humalog  (insulin  lispro) vitengo 6 wakati wa chakula cha mchana na chakula cha jioni  Endelea dawa zingine zote sawa.  Keep up the good work with diet and exercise. Aim for a diet full of vegetables, fruit and lean meats (chicken, turkey, fish). Try to limit salt intake by eating fresh or frozen vegetables (instead of canned), rinse canned vegetables prior to cooking and do not add any additional salt to meals.

## 2024-02-06 NOTE — Assessment & Plan Note (Signed)
 Diabetes longstanding since 2016 currently uncontrolled. Patient is able to verbalize appropriate hypoglycemia management plan. Medication adherence appears sporadic. Control is suboptimal due to non adherence and suboptimal regimen. Patient reports taking Lantus  (insulin  glargine) in the afternoon, closer to dinner time and the Novolog  (insulin  aspart) in the morning. -Continued basal insulin  Basaglar  (insulin  glargine) at 10 units daily in the morning. Educated patient extensively on timing of dose.  -Continued 6 units of rapid insulin  Humalog  (insulin  lispro) at lunch and dinner -Continued metformin  1000 mg BID.  -Patient educated on purpose, proper use, and potential adverse effects.  -Extensively discussed pathophysiology of diabetes, recommended lifestyle interventions, dietary effects on blood sugar control.  -Counseled on s/sx of and management of hypoglycemia.

## 2024-02-07 NOTE — Progress Notes (Signed)
 Reviewed and agree with Dr Rennis plan.

## 2024-02-27 ENCOUNTER — Ambulatory Visit: Payer: Self-pay

## 2024-02-27 NOTE — Progress Notes (Incomplete)
° ° °  SUBJECTIVE:   CHIEF COMPLAINT / HPI:   Randy Fisher is a 68yo M who presents to the clinic for follow up of DM management. Patient states he hasn't been feeling well the last few days. He reports he has not taken his insulin  since last visit because he did not receive the correct mm needles. The pharmacy gave him 5mm instead of 4mm as prescribed. Libre3+ monitor reports Bgs in the 250s-380s in the last 14 days.    PERTINENT  PMH / PSH: ***  OBJECTIVE:   There were no vitals taken for this visit.  ***  ASSESSMENT/PLAN:   Assessment & Plan      Camie Dixons, DO [facility]

## 2024-03-05 ENCOUNTER — Encounter: Payer: Self-pay | Admitting: Pharmacist

## 2024-03-05 ENCOUNTER — Ambulatory Visit: Admitting: Pharmacist

## 2024-03-05 VITALS — BP 111/62 | HR 85 | Wt 165.0 lb

## 2024-03-05 DIAGNOSIS — Z794 Long term (current) use of insulin: Secondary | ICD-10-CM

## 2024-03-05 DIAGNOSIS — E1169 Type 2 diabetes mellitus with other specified complication: Secondary | ICD-10-CM

## 2024-03-05 MED ORDER — BASAGLAR KWIKPEN 100 UNIT/ML ~~LOC~~ SOPN: 10.0000 [IU] | PEN_INJECTOR | SUBCUTANEOUS | 11 refills | Status: AC

## 2024-03-05 MED ORDER — NOVOLOG FLEXPEN 100 UNIT/ML ~~LOC~~ SOPN: 7.0000 [IU] | PEN_INJECTOR | Freq: Two times a day (BID) | SUBCUTANEOUS | 11 refills | Status: AC

## 2024-03-05 NOTE — Progress Notes (Signed)
 S:     Chief Complaint  Patient presents with   Medication Management    Diabetes and Medication Review/Adherence   68 y.o. male who presents for diabetes evaluation, education, and management. Patient arrives in good spirits and presents without any assistance. Patient is accompanied by interpreter, Bertrum.   Patient was referred and last seen by Primary Care Provider, Dr. Lupie, on 01/31/2024.  At last visit with me we clarified insulin  regimen and patient now reports feeling much better.   PMH is significant for T2DM, hyperlipidemia, hypertension  Patient reports Diabetes was diagnosed in 2016.    Current diabetes medications include: Lantus  (insulin  glargine) 10 units daily, Novolog  (insulin  aspart) 6 units BID AC, metformin  1000mg  BID AM and HS Current hypertension medications include: lisinopril  2.5 mg daily Current hyperlipidemia medications include: Atorvastatin  40 mg daily   Insurance coverage: Medicare   Patient has hypoglycemic rreadings/events on multiple overnight readings possibly related to small meal or minimal meal intake in the evening.    Patient reports  decreased nocturia (nighttime urination). Reduced to 2 or fewer times per night.    Patient reports neuropathy (nerve pain) is under fair control with gabapentin .  Patient reports medication adherence, however, pill counts and fill dates are inconsistent with adherence.  Patient willing to try pill box to help with taking medications as scheduled.   O:   Review of Systems  Musculoskeletal:  Positive for joint pain (right arm near recent blood draw site).  All other systems reviewed and are negative.   Physical Exam Vitals reviewed.  Constitutional:      Appearance: Normal appearance.  Pulmonary:     Effort: Pulmonary effort is normal.  Neurological:     Mental Status: He is alert.  Psychiatric:        Mood and Affect: Mood normal.        Behavior: Behavior normal.        Thought Content:  Thought content normal.        Judgment: Judgment normal.     Libre3 CGM Download today - Data from the two weeks prior to 12/5 % Time CGM is active: 96% Average Glucose: 298 mg/dL Glucose Management Indicator: 10.4  Glucose Variability: 34.8% (goal <36%) Time in Goal:  - Time in range 70-180: 16% - Time above range: 82% - Time below range: 2% Observed patterns: multiple (#4) overnight episodes of abrupt decline followed by readings less than 70 - discussed eating larger meals and not skipping meals if taking Humalog  (insulin  lispro)   Lab Results  Component Value Date   HGBA1C >15 01/31/2024   Vitals:   03/05/24 0941  BP: 111/62  Pulse: 85  SpO2: 98%    Lipid Panel     Component Value Date/Time   CHOL 103 01/31/2024 1459   TRIG 45 01/31/2024 1459   HDL 44 01/31/2024 1459   CHOLHDL 2.3 01/31/2024 1459   CHOLHDL 5.8 (H) 06/03/2015 1640   VLDL 34 (H) 06/03/2015 1640   LDLCALC 48 01/31/2024 1459    Patient is participating in a Managed Medicaid Plan:  Yes   A/P: Diabetes longstanding since 2016 currently remains uncontrolled based on CGM however is greatly improved based on decrease in nocturia and patient reports feeling much better.  Patient did not have CGM sensor on since last week. Patient is able to verbalize appropriate hypoglycemia management plan. Medication adherence appears sporadic based on pill count, patient can explain how he is supposed to take medications correctly.  Willing to start using pill box. Control is suboptimal due to non adherence and suboptimal regimen.  Patient reports taking Lantus  (insulin  glargine) in the AM  And Humalog  (insulin  lispro) 6  units twice daily prior to two large meals  - Continued basal insulin  Basaglar  (insulin  glargine) at 10 units daily in the morning.  - Increased dose of Humalog  (insulin  lispro) from 6 to 7 units at lunch and dinner - Continued metformin  1000 mg BID.  -Patient educated on purpose, proper use, and  potential adverse effects.  -Extensively discussed pathophysiology of diabetes, recommended lifestyle interventions, dietary effects on blood sugar control.  -Counseled on s/sx of and management of hypoglycemia.    ASCVD risk - primary prevention in patient with diabetes. Last LDL is 48 at goal of <70  mg/dL. ASCVD risk factors include HTN, HLD and T2DM and 10-year ASCVD risk score of 27.4%. High intensity statin indicated.  -Continued atorvastatin  40 mg    Hypertension longstanding currently controlled. Blood pressure goal of <130/80  mmHg. Medication adherence concerning. -Continue lisinopril  2.5 mg daily  Provided and assisted patient with filling 1 week pill box (4x daily slots)  Patient shared that he thought this would help him take his medications more consistently.    Concern for food insecurity - with the help of nursing staff, provided some food for use over the next few weeks.   Written patient instructions provided. Patient verbalized understanding of treatment plan.  Total time in face to face counseling 42 minutes.    Follow-up:  Pharmacist 04/02/24 PCP clinic visit in TBD

## 2024-03-05 NOTE — Assessment & Plan Note (Signed)
 Diabetes longstanding since 2016 currently remains uncontrolled based on CGM however is greatly improved based on decrease in nocturia and patient reports feeling much better.  Patient did not have CGM sensor on since last week. Patient is able to verbalize appropriate hypoglycemia management plan. Medication adherence appears sporadic based on pill count, patient can explain how he is supposed to take medications correctly.  Willing to start using pill box. Control is suboptimal due to non adherence and suboptimal regimen.  Patient reports taking Lantus  (insulin  glargine) in the AM  And Humalog  (insulin  lispro) 6  units twice daily prior to two large meals  - Continued basal insulin  Basaglar  (insulin  glargine) at 10 units daily in the morning.  - Increased dose of Humalog  (insulin  lispro) from 6 to 7 units at lunch and dinner - Continued metformin  1000 mg BID.  -Patient educated on purpose, proper use, and potential adverse effects.

## 2024-03-05 NOTE — Patient Instructions (Addendum)
 Ilikuwa nzuri kukuona leo!  Usomaji wako wa Glucose umeboreshwa zaidi.  Tafadhali endelea kumeza dawa zako kwa kutumia kisanduku cha vidonge kilichotolewa leo.   Mabadiliko ya Dawa: Ongeza insulini yako ya wakati wa kula hadi vitengo 7 MARA MBILI kila siku kabla ya milo.   Endelea dawa zingine zote sawa.   Endelea na kazi nzuri na lishe na mazoezi. Lengo la chakula kilichojaa mboga [place], matunda na nyama konda ([place] Jaribu kupunguza ulaji wa chumvi kwa kula mboga safi au zilizogandishwa (badala ya [employer]), suuza mboga za makopo kabla ya kupika na usiongeze chumvi yoyote kwenye milo.   Nitakuona tena Januari 15 saa 9:30    It was nice to see you today!  Your Glucose readings are much improved.  Please continue to take your medications with the use of the pill box provided today.   Medication Changes: Increase Your mealtime insulin  to 7 units TWICE daily prior to meals.   Continue all other medication the same.   Keep up the good work with diet and exercise. Aim for a diet full of vegetables, fruit and lean meats (chicken, turkey, fish). Try to limit salt intake by eating fresh or frozen vegetables (instead of canned), rinse canned vegetables prior to cooking and do not add any additional salt to meals.   I will see you again January 15th at 9:30

## 2024-03-06 NOTE — Progress Notes (Signed)
 Reviewed and agree with Dr Rennis plan.

## 2024-03-09 ENCOUNTER — Other Ambulatory Visit: Payer: Self-pay | Admitting: Family Medicine

## 2024-03-09 DIAGNOSIS — K5900 Constipation, unspecified: Secondary | ICD-10-CM

## 2024-03-11 ENCOUNTER — Other Ambulatory Visit: Payer: Self-pay | Admitting: Family Medicine

## 2024-03-11 DIAGNOSIS — K5900 Constipation, unspecified: Secondary | ICD-10-CM

## 2024-04-02 ENCOUNTER — Ambulatory Visit: Admitting: Pharmacist
